# Patient Record
Sex: Male | Born: 1965
Health system: Southern US, Community
[De-identification: ages and names within clinical notes are randomized; demographics above are authoritative.]

## PROBLEM LIST (undated history)

## (undated) DIAGNOSIS — E119 Type 2 diabetes mellitus without complications: Secondary | ICD-10-CM

## (undated) DIAGNOSIS — I10 Essential (primary) hypertension: Secondary | ICD-10-CM

---
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
## 2006-03-21 DIAGNOSIS — K529 Noninfective gastroenteritis and colitis, unspecified: Secondary | ICD-10-CM | POA: Insufficient documentation

## 2010-03-21 ENCOUNTER — Emergency Department: Payer: Self-pay | Admitting: Emergency Medicine

## 2014-04-25 ENCOUNTER — Emergency Department: Payer: Self-pay | Admitting: Emergency Medicine

## 2014-04-25 LAB — COMPREHENSIVE METABOLIC PANEL
AST: 44 U/L — AB (ref 15–37)
Albumin: 3.2 g/dL — ABNORMAL LOW (ref 3.4–5.0)
Alkaline Phosphatase: 78 U/L
Anion Gap: 7 (ref 7–16)
BUN: 13 mg/dL (ref 7–18)
Bilirubin,Total: 0.3 mg/dL (ref 0.2–1.0)
CHLORIDE: 103 mmol/L (ref 98–107)
CO2: 27 mmol/L (ref 21–32)
Calcium, Total: 8.5 mg/dL (ref 8.5–10.1)
Creatinine: 1.11 mg/dL (ref 0.60–1.30)
EGFR (African American): >60
EGFR (Non-African Amer.): >60
Glucose: 151 mg/dL — ABNORMAL HIGH (ref 65–99)
OSMOLALITY: 277 (ref 275–301)
POTASSIUM: 3.7 mmol/L (ref 3.5–5.1)
SGPT (ALT): 80 U/L — ABNORMAL HIGH
Sodium: 137 mmol/L (ref 136–145)
TOTAL PROTEIN: 7.4 g/dL (ref 6.4–8.2)

## 2014-04-25 LAB — CBC
HCT: 44.3 % (ref 40.0–52.0)
HGB: 14.6 g/dL (ref 13.0–18.0)
MCH: 27.5 pg (ref 26.0–34.0)
MCHC: 32.9 g/dL (ref 32.0–36.0)
MCV: 84 fL (ref 80–100)
PLATELETS: 265 10*3/uL (ref 150–440)
RBC: 5.29 10*6/uL (ref 4.40–5.90)
RDW: 14.2 % (ref 11.5–14.5)
WBC: 8.9 10*3/uL (ref 3.8–10.6)

## 2014-04-25 LAB — TROPONIN I

## 2014-04-25 LAB — PRO B NATRIURETIC PEPTIDE: B-Type Natriuretic Peptide: 64 pg/mL (ref 0–125)

## 2015-01-27 LAB — HEPATIC FUNCTION PANEL
ALT: 45 U/L — AB (ref 10–40)
AST: 29 U/L (ref 14–40)
Alkaline Phosphatase: 93 U/L (ref 25–125)
BILIRUBIN, TOTAL: 0.4 mg/dL

## 2015-01-27 LAB — CBC AND DIFFERENTIAL
HCT: 42 % (ref 41–53)
Hemoglobin: 14.5 g/dL (ref 13.5–17.5)
Neutrophils Absolute: 2 /uL
Platelets: 268 10*3/uL (ref 150–399)
WBC: 5.3 10^3/mL

## 2015-01-27 LAB — BASIC METABOLIC PANEL
BUN: 9 mg/dL (ref 4–21)
Creatinine: 1.1 mg/dL (ref 0.6–1.3)
Glucose: 128 mg/dL
POTASSIUM: 4.3 mmol/L (ref 3.4–5.3)
SODIUM: 138 mmol/L (ref 137–147)

## 2015-02-04 DIAGNOSIS — R030 Elevated blood-pressure reading, without diagnosis of hypertension: Secondary | ICD-10-CM

## 2015-02-04 DIAGNOSIS — IMO0001 Reserved for inherently not codable concepts without codable children: Secondary | ICD-10-CM | POA: Insufficient documentation

## 2015-02-04 DIAGNOSIS — E66811 Obesity, class 1: Secondary | ICD-10-CM | POA: Insufficient documentation

## 2015-02-04 DIAGNOSIS — L03119 Cellulitis of unspecified part of limb: Secondary | ICD-10-CM | POA: Insufficient documentation

## 2015-02-04 DIAGNOSIS — R600 Localized edema: Secondary | ICD-10-CM | POA: Insufficient documentation

## 2015-02-04 DIAGNOSIS — E669 Obesity, unspecified: Secondary | ICD-10-CM | POA: Insufficient documentation

## 2015-02-04 DIAGNOSIS — L918 Other hypertrophic disorders of the skin: Secondary | ICD-10-CM | POA: Insufficient documentation

## 2015-02-06 ENCOUNTER — Encounter: Payer: Self-pay | Admitting: Family Medicine

## 2015-02-06 ENCOUNTER — Ambulatory Visit (INDEPENDENT_AMBULATORY_CARE_PROVIDER_SITE_OTHER): Payer: BLUE CROSS/BLUE SHIELD | Admitting: Family Medicine

## 2015-02-06 VITALS — BP 158/102 | HR 74 | Temp 98.3°F | Resp 16 | Wt 294.4 lb

## 2015-02-06 DIAGNOSIS — L02419 Cutaneous abscess of limb, unspecified: Secondary | ICD-10-CM

## 2015-02-06 DIAGNOSIS — I10 Essential (primary) hypertension: Secondary | ICD-10-CM

## 2015-02-06 DIAGNOSIS — L03119 Cellulitis of unspecified part of limb: Secondary | ICD-10-CM | POA: Diagnosis not present

## 2015-02-06 MED ORDER — HYDROCHLOROTHIAZIDE 25 MG PO TABS: 25.0000 mg | ORAL_TABLET | Freq: Every day | ORAL | Status: DC

## 2015-02-06 NOTE — Patient Instructions (Signed)

## 2015-02-06 NOTE — Progress Notes (Signed)
   Subjective:    Patient ID: Charles Fleeterrick D Heberlein [name], male    DOB: 11/13/1965, 49 y.o.   MRN: [id number]  HPI Chief Complaint  Patient presents with  . Cellulitis    lower extremity, follow up, last OV 10 days ago  Improvement in tenderness and redness since starting the Epsom saltwater soaks and Doxycycline. Has not had any fever or chills. Blood pressure was 142/98 at the last OV 01-27-15. Trying to limit sodium, caffeine, fats and beginning to exercise regularly. Family History  Problem Relation Age of Onset  . Breast cancer Mother   . Cancer Father   . Diabetes Father    Past Surgical History  Procedure Laterality Date  . Elbow surgery Right 1975  . Leg surgery Right 1971   Patient Active Problem List   Diagnosis Date Noted  . Achrochordon 02/04/2015  . Bacterial skin infection of leg 02/04/2015  . Edema of foot 02/04/2015  . Blood pressure elevated 02/04/2015  . Adult BMI 30+ 02/04/2015  . Enteritis presumed infectious 03/21/2006   No Known Allergies History  Substance Use Topics  . Smoking status: Never Smoker   . Smokeless tobacco: Current User    Types: Chew     Comment: has been using for 8 years  . Alcohol Use: No     Review of Systems  Constitutional: Negative.   Respiratory: Negative.   Cardiovascular: Negative.    Gastrointestinal: Negative.   Skin: Positive for color change. Negative for rash and wound.      Blood pressure 158/102, pulse 74, temperature 98.3 F (36.8 C), temperature source Oral, resp. rate 16, weight 294 lb 6.4 oz (133.539 kg). Objective:   Physical Exam  Constitutional: He appears well-developed and well-nourished.  Eyes: Pupils are equal, round, and reactive to light.  Cardiovascular: Normal rate, regular rhythm and normal heart sounds.   Pulmonary/Chest: Effort normal and breath sounds normal.  Abdominal: Soft. Bowel sounds are normal.  Lymphadenopathy:       Right: No inguinal adenopathy present.  Skin:      No current  outpatient prescriptions on file prior to visit.   No current facility-administered medications on file prior to visit.    No Known Allergies     Assessment & Plan:  Cellulitis and abscess of lower extremity Healing well. Will finish the Doxycycline today. May continue soaks prn and recheck as needed. Essential hypertension - Plan: hydrochlorothiazide (HYDRODIURIL) 25 MG tablet Persistent elevation of BP. Will treat with HCTZ and recheck in 2 weeks. Continue exercise and diet plan to lose some weight. Limit sodium and caffeine intake as previously planned.

## 2015-02-20 ENCOUNTER — Encounter: Payer: Self-pay | Admitting: Family Medicine

## 2015-02-20 ENCOUNTER — Ambulatory Visit (INDEPENDENT_AMBULATORY_CARE_PROVIDER_SITE_OTHER): Payer: BLUE CROSS/BLUE SHIELD | Admitting: Family Medicine

## 2015-02-20 VITALS — BP 160/98 | HR 76 | Temp 98.5°F | Resp 18 | Wt 289.4 lb

## 2015-02-20 DIAGNOSIS — I10 Essential (primary) hypertension: Secondary | ICD-10-CM

## 2015-02-20 DIAGNOSIS — L03115 Cellulitis of right lower limb: Secondary | ICD-10-CM

## 2015-02-20 MED ORDER — LISINOPRIL-HYDROCHLOROTHIAZIDE 10-12.5 MG PO TABS: 1.0000 | ORAL_TABLET | Freq: Every day | ORAL | Status: DC

## 2015-02-20 NOTE — Progress Notes (Signed)
Subjective:    Patient ID: Charles [name]., male    DOB: October 04, 1965, 49 y.o.   MRN: [id number]  Chief Complaint  Patient presents with  . Follow-up blood pressure elevation. Tolerating HCTZ without side effects. Actually "feeling better". It is causing him to urinate more.    -  Cellulitis of the right lower leg is nearly completely cleared. No discomfort or open sores. No swelling. Minimal pinkness remains. Has finished all the antibiotics. Using knee high support hose.  HPI Patient Active Problem List   Diagnosis Date Noted  . Achrochordon 02/04/2015  . Bacterial skin infection of leg 02/04/2015  . Edema of foot 02/04/2015  . Blood pressure elevated 02/04/2015  . Adult BMI 30+ 02/04/2015  . Enteritis presumed infectious 03/21/2006   Past Surgical History  Procedure Laterality Date  . Elbow surgery Right 1975  . Leg surgery Right 1971   Family History  Problem Relation Age of Onset  . Breast cancer Mother   . Cancer Father   . Diabetes Father    History   Social History  . Marital Status: Married    Spouse Name: N/A  . Number of Children: N/A  . Years of Education: N/A   Occupational History  . Not on file.   Social History Main Topics  . Smoking status: Never Smoker   . Smokeless tobacco: Current User    Types: Chew     Comment: has been using for 8 years  . Alcohol Use: No  . Drug Use: No  . Sexual Activity: Not on file   Other Topics Concern  . Not on file   Social History Narrative   Current Outpatient Prescriptions on File Prior to Visit  Medication Sig Dispense Refill  . aspirin 325 MG tablet Take 325 mg by mouth daily.    . hydrochlorothiazide (HYDRODIURIL) 25 MG tablet Take 1 tablet (25 mg total) by mouth daily. 30 tablet 3   No current facility-administered medications on file prior to visit.   No Known Allergies  Review of Systems  Constitutional: Negative.   HENT: Negative.   Respiratory: Negative.   Cardiovascular: Negative.    Gastrointestinal: Negative.   Skin:       Cellulitis clearing.      BP 160/98 mmHg  Pulse 76  Temp(Src) 98.5 F (36.9 C) (Oral)  Resp 18  Wt 289 lb 6.4 oz (131.271 kg)  Objective:   Physical Exam  Constitutional: He is oriented to person, place, and time. He appears well-developed and well-nourished. No distress.  HENT:  Head: Normocephalic and atraumatic.  Right Ear: Hearing normal.  Left Ear: Hearing normal.  Nose: Nose normal.  Eyes: Conjunctivae and lids are normal. Right eye exhibits no discharge. Left eye exhibits no discharge. No scleral icterus.  Pulmonary/Chest: Effort normal. No respiratory distress.  Musculoskeletal: Normal range of motion.  Neurological: He is alert and oriented to person, place, and time.  Skin: Skin is intact. No lesion and no rash noted.  Psychiatric: He has a normal mood and affect. His speech is normal and behavior is normal. Thought content normal.       Assessment & Plan:  1. Essential hypertension Feeling well on the HCTZ 25 mg qd but BP not down any yet. Has been working on diet and lost 5 lbs. Will add Lisinopril/HCTZ and stop the HCTZ 25 mg qd. - CBC with Differential/Platelet; Future - Comprehensive metabolic panel; Future - lisinopril-hydrochlorothiazide (PRINZIDE,ZESTORETIC) 10-12.5 MG per tablet; Take 1 tablet  by mouth daily.  Dispense: 30 tablet; Refill: 3  2. Cellulitis of right lower extremity Resolved. No further discomfort. Recheck labs for any remaining infection. - CBC with Differential/Platelet; Future

## 2015-02-21 LAB — COMPREHENSIVE METABOLIC PANEL
ALT: 53 IU/L — ABNORMAL HIGH (ref 0–44)
AST: 42 IU/L — ABNORMAL HIGH (ref 0–40)
Albumin/Globulin Ratio: 1.4 (ref 1.1–2.5)
Albumin: 4.2 g/dL (ref 3.5–5.5)
Alkaline Phosphatase: 96 IU/L (ref 39–117)
BILIRUBIN TOTAL: 0.4 mg/dL (ref 0.0–1.2)
BUN / CREAT RATIO: 12 (ref 9–20)
BUN: 13 mg/dL (ref 6–24)
CHLORIDE: 99 mmol/L (ref 97–108)
CO2: 27 mmol/L (ref 18–29)
Calcium: 9.5 mg/dL (ref 8.7–10.2)
Creatinine, Ser: 1.1 mg/dL (ref 0.76–1.27)
GFR, EST AFRICAN AMERICAN: 89 mL/min/{1.73_m2} (ref >59)
GFR, EST NON AFRICAN AMERICAN: 77 mL/min/{1.73_m2} (ref >59)
Globulin, Total: 3.1 g/dL (ref 1.5–4.5)
Glucose: 117 mg/dL — ABNORMAL HIGH (ref 65–99)
POTASSIUM: 4 mmol/L (ref 3.5–5.2)
Sodium: 140 mmol/L (ref 134–144)
Total Protein: 7.3 g/dL (ref 6.0–8.5)

## 2015-02-21 LAB — CBC WITH DIFFERENTIAL/PLATELET
Basophils Absolute: 0 10*3/uL (ref 0.0–0.2)
Basos: 1 %
EOS (ABSOLUTE): 0.2 10*3/uL (ref 0.0–0.4)
Eos: 3 %
HEMATOCRIT: 45.1 % (ref 37.5–51.0)
Hemoglobin: 15.1 g/dL (ref 12.6–17.7)
Immature Grans (Abs): 0 10*3/uL (ref 0.0–0.1)
Immature Granulocytes: 0 %
Lymphocytes Absolute: 3.8 10*3/uL — ABNORMAL HIGH (ref 0.7–3.1)
Lymphs: 57 %
MCH: 27.3 pg (ref 26.6–33.0)
MCHC: 33.5 g/dL (ref 31.5–35.7)
MCV: 81 fL (ref 79–97)
MONOCYTES: 6 %
MONOS ABS: 0.4 10*3/uL (ref 0.1–0.9)
NEUTROS ABS: 2.2 10*3/uL (ref 1.4–7.0)
Neutrophils: 33 %
Platelets: 279 10*3/uL (ref 150–379)
RBC: 5.54 x10E6/uL (ref 4.14–5.80)
RDW: 15.2 % (ref 12.3–15.4)
WBC: 6.7 10*3/uL (ref 3.4–10.8)

## 2015-02-23 ENCOUNTER — Telehealth: Payer: Self-pay

## 2015-02-23 NOTE — Telephone Encounter (Signed)
Patient advised as directed below. Patient verbalized understanding.  

## 2015-02-23 NOTE — Telephone Encounter (Signed)
-----   Message from Tamsen Roers, [place] sent at 02/22/2015 10:46 PM EDT ----- No sign of any remaining infection on CBC. Liver enzymes show a little strain. Recommend increased water intake and recheck BP and liver enzymes in 2 months.

## 2015-03-25 ENCOUNTER — Other Ambulatory Visit: Payer: Self-pay | Admitting: Student

## 2015-03-25 DIAGNOSIS — G5611 Other lesions of median nerve, right upper limb: Secondary | ICD-10-CM

## 2015-03-25 DIAGNOSIS — M7521 Bicipital tendinitis, right shoulder: Secondary | ICD-10-CM

## 2015-03-26 ENCOUNTER — Ambulatory Visit: Admission: RE | Admit: 2015-03-26 | Payer: BLUE CROSS/BLUE SHIELD | Source: Ambulatory Visit

## 2015-03-27 ENCOUNTER — Ambulatory Visit: Admit: 2015-03-27 | Payer: Self-pay

## 2015-03-27 ENCOUNTER — Ambulatory Visit
Admission: RE | Admit: 2015-03-27 | Discharge: 2015-03-27 | Disposition: A | Discharge status: Home / Self Care | Payer: BLUE CROSS/BLUE SHIELD | Source: Ambulatory Visit | Attending: Student | Admitting: Student

## 2015-03-27 DIAGNOSIS — S46211A Strain of muscle, fascia and tendon of other parts of biceps, right arm, initial encounter: Secondary | ICD-10-CM | POA: Diagnosis not present

## 2015-03-27 DIAGNOSIS — M79631 Pain in right forearm: Secondary | ICD-10-CM | POA: Diagnosis present

## 2015-03-27 DIAGNOSIS — M7521 Bicipital tendinitis, right shoulder: Secondary | ICD-10-CM

## 2015-03-27 DIAGNOSIS — G5611 Other lesions of median nerve, right upper limb: Secondary | ICD-10-CM

## 2015-03-30 DIAGNOSIS — S46219A Strain of muscle, fascia and tendon of other parts of biceps, unspecified arm, initial encounter: Secondary | ICD-10-CM | POA: Insufficient documentation

## 2015-03-31 ENCOUNTER — Encounter
Admission: RE | Disposition: A | Discharge status: Home / Self Care | Payer: Self-pay | Source: Ambulatory Visit | Attending: Surgery

## 2015-03-31 ENCOUNTER — Ambulatory Visit: Payer: BLUE CROSS/BLUE SHIELD | Admitting: Anesthesiology

## 2015-03-31 ENCOUNTER — Encounter: Payer: Self-pay | Admitting: Anesthesiology

## 2015-03-31 ENCOUNTER — Ambulatory Visit
Admission: RE | Admit: 2015-03-31 | Discharge: 2015-03-31 | Disposition: A | Discharge status: Home / Self Care | Payer: BLUE CROSS/BLUE SHIELD | Source: Ambulatory Visit | Attending: Surgery | Admitting: Surgery

## 2015-03-31 DIAGNOSIS — Y998 Other external cause status: Secondary | ICD-10-CM | POA: Diagnosis not present

## 2015-03-31 DIAGNOSIS — F17220 Nicotine dependence, chewing tobacco, uncomplicated: Secondary | ICD-10-CM | POA: Insufficient documentation

## 2015-03-31 DIAGNOSIS — R9431 Abnormal electrocardiogram [ECG] [EKG]: Secondary | ICD-10-CM | POA: Diagnosis not present

## 2015-03-31 DIAGNOSIS — S46211A Strain of muscle, fascia and tendon of other parts of biceps, right arm, initial encounter: Secondary | ICD-10-CM | POA: Insufficient documentation

## 2015-03-31 DIAGNOSIS — Y9369 Activity, other involving other sports and athletics played as a team or group: Secondary | ICD-10-CM | POA: Insufficient documentation

## 2015-03-31 DIAGNOSIS — Z7982 Long term (current) use of aspirin: Secondary | ICD-10-CM | POA: Insufficient documentation

## 2015-03-31 DIAGNOSIS — I10 Essential (primary) hypertension: Secondary | ICD-10-CM | POA: Diagnosis not present

## 2015-03-31 DIAGNOSIS — X58XXXA Exposure to other specified factors, initial encounter: Secondary | ICD-10-CM | POA: Insufficient documentation

## 2015-03-31 DIAGNOSIS — Z79899 Other long term (current) drug therapy: Secondary | ICD-10-CM | POA: Insufficient documentation

## 2015-03-31 DIAGNOSIS — E669 Obesity, unspecified: Secondary | ICD-10-CM | POA: Diagnosis not present

## 2015-03-31 HISTORY — PX: DISTAL BICEPS TENDON REPAIR: SHX1461

## 2015-03-31 SURGERY — REPAIR, TENDON, BICEPS, DISTAL
Anesthesia: General | Site: Elbow | Laterality: Right | Wound class: Clean

## 2015-03-31 MED ORDER — NEOMYCIN-POLYMYXIN B GU 40-200000 IR SOLN
Status: AC
  Filled 2015-03-31: qty 2

## 2015-03-31 MED ORDER — LABETALOL HCL 5 MG/ML IV SOLN
INTRAVENOUS | Status: DC | PRN
  Administered 2015-03-31: 10 mg via INTRAVENOUS

## 2015-03-31 MED ORDER — BUPIVACAINE HCL (PF) 0.5 % IJ SOLN
INTRAMUSCULAR | Status: AC
  Filled 2015-03-31: qty 30

## 2015-03-31 MED ORDER — OXYCODONE HCL 5 MG PO TABS
5.0000 mg | ORAL_TABLET | Freq: Once | ORAL | Status: AC
  Administered 2015-03-31: 5 mg via ORAL

## 2015-03-31 MED ORDER — FENTANYL CITRATE (PF) 100 MCG/2ML IJ SOLN
INTRAMUSCULAR | Status: AC
  Administered 2015-03-31: 50 ug via INTRAVENOUS
  Filled 2015-03-31: qty 2

## 2015-03-31 MED ORDER — GLYCOPYRROLATE 0.2 MG/ML IJ SOLN
INTRAMUSCULAR | Status: DC | PRN
  Administered 2015-03-31: 0.2 mg via INTRAVENOUS

## 2015-03-31 MED ORDER — MIDAZOLAM HCL 5 MG/ML IJ SOLN
1.0000 mg | Freq: Once | INTRAMUSCULAR | Status: AC
  Administered 2015-03-31: 1 mg via INTRAVENOUS

## 2015-03-31 MED ORDER — BUPIVACAINE HCL (PF) 0.5 % IJ SOLN
INTRAMUSCULAR | Status: DC | PRN
  Administered 2015-03-31: 15 mL

## 2015-03-31 MED ORDER — MIDAZOLAM HCL 2 MG/2ML IJ SOLN
INTRAMUSCULAR | Status: DC | PRN
  Administered 2015-03-31 (×2): 1 mg via INTRAVENOUS

## 2015-03-31 MED ORDER — OXYCODONE HCL 5 MG PO TABS: 5.0000 mg | ORAL_TABLET | ORAL | Status: DC | PRN

## 2015-03-31 MED ORDER — MIDAZOLAM HCL 5 MG/5ML IJ SOLN
INTRAMUSCULAR | Status: AC
  Filled 2015-03-31: qty 5

## 2015-03-31 MED ORDER — LACTATED RINGERS IV SOLN
INTRAVENOUS | Status: DC | PRN
  Administered 2015-03-31: 13:00:00 via INTRAVENOUS

## 2015-03-31 MED ORDER — OXYCODONE HCL 5 MG PO TABS
ORAL_TABLET | ORAL | Status: AC
  Filled 2015-03-31: qty 1

## 2015-03-31 MED ORDER — ONDANSETRON HCL 4 MG/2ML IJ SOLN: 4.0000 mg | Freq: Once | INTRAMUSCULAR | Status: DC | PRN

## 2015-03-31 MED ORDER — FENTANYL CITRATE (PF) 100 MCG/2ML IJ SOLN
50.0000 ug | Freq: Once | INTRAMUSCULAR | Status: AC
  Administered 2015-03-31: 50 ug via INTRAVENOUS

## 2015-03-31 MED ORDER — NEOMYCIN-POLYMYXIN B GU IR SOLN
Status: DC | PRN
  Administered 2015-03-31: 400 mL

## 2015-03-31 MED ORDER — PROPOFOL 10 MG/ML IV BOLUS
INTRAVENOUS | Status: DC | PRN
  Administered 2015-03-31: 150 mg via INTRAVENOUS

## 2015-03-31 MED ORDER — DEXTROSE 5 % IV SOLN
3.0000 g | Freq: Once | INTRAVENOUS | Status: AC
  Administered 2015-03-31: 3 g via INTRAVENOUS
  Filled 2015-03-31: qty 3000

## 2015-03-31 MED ORDER — LACTATED RINGERS IV SOLN
Freq: Once | INTRAVENOUS | Status: AC
  Administered 2015-03-31: 09:00:00 via INTRAVENOUS

## 2015-03-31 MED ORDER — LIDOCAINE HCL (CARDIAC) 20 MG/ML IV SOLN
INTRAVENOUS | Status: DC | PRN
  Administered 2015-03-31: 40 mg via INTRAVENOUS

## 2015-03-31 MED ORDER — FENTANYL CITRATE (PF) 100 MCG/2ML IJ SOLN: 25.0000 ug | INTRAMUSCULAR | Status: DC | PRN

## 2015-03-31 MED ORDER — FENTANYL CITRATE (PF) 100 MCG/2ML IJ SOLN
INTRAMUSCULAR | Status: DC | PRN
  Administered 2015-03-31 (×2): 50 ug via INTRAVENOUS

## 2015-03-31 SURGICAL SUPPLY — 39 items
BANDAGE ELASTIC 4 CLIP ST LF (GAUZE/BANDAGES/DRESSINGS) ×3 IMPLANT
BENZOIN TINCTURE PRP APPL 2/3 (GAUZE/BANDAGES/DRESSINGS) ×3 IMPLANT
BNDG COHESIVE 4X5 TAN STRL (GAUZE/BANDAGES/DRESSINGS) ×3 IMPLANT
BNDG COHESIVE 4X5 WHT NS (GAUZE/BANDAGES/DRESSINGS) ×3 IMPLANT
BNDG ESMARK 4X12 TAN STRL LF (GAUZE/BANDAGES/DRESSINGS) ×3 IMPLANT
CANISTER SUCT 1200ML W/VALVE (MISCELLANEOUS) ×3 IMPLANT
CHLORAPREP W/TINT 26ML (MISCELLANEOUS) ×6 IMPLANT
CLOSURE WOUND 1/2 X4 (GAUZE/BANDAGES/DRESSINGS) ×1
CUFF TOURN 24 STER (MISCELLANEOUS) ×3 IMPLANT
CUFF TOURN SGL QUICK 18 (TOURNIQUET CUFF) IMPLANT
DRAPE FLUOR MINI C-ARM 54X84 (DRAPES) ×3 IMPLANT
DRAPE SURG 17X11 SM STRL (DRAPES) ×3 IMPLANT
GAUZE PETRO XEROFOAM 1X8 (MISCELLANEOUS) ×3 IMPLANT
GAUZE SPONGE 4X4 12PLY STRL (GAUZE/BANDAGES/DRESSINGS) ×3 IMPLANT
GAUZE XEROFORM 4X4 STRL (GAUZE/BANDAGES/DRESSINGS) ×3 IMPLANT
GLOVE BIO SURGEON STRL SZ8 (GLOVE) ×6 IMPLANT
GLOVE INDICATOR 8.0 STRL GRN (GLOVE) ×3 IMPLANT
GOWN STRL REUS W/ TWL LRG LVL3 (GOWN DISPOSABLE) ×2 IMPLANT
GOWN STRL REUS W/TWL LRG LVL3 (GOWN DISPOSABLE) ×4
IMPL TOGGLELOC ELBOW SYSTEM (Orthopedic Implant) ×1 IMPLANT
IMPLANT TOGGLELOC ELBOW SYSTEM (Orthopedic Implant) ×3 IMPLANT
KIT RM TURNOVER STRD PROC AR (KITS) ×3 IMPLANT
NS IRRIG 500ML POUR BTL (IV SOLUTION) ×3 IMPLANT
PACK EXTREMITY ARMC (MISCELLANEOUS) ×3 IMPLANT
PAD CAST CTTN 4X4 STRL (SOFTGOODS) ×1 IMPLANT
PAD GROUND ADULT SPLIT (MISCELLANEOUS) ×3 IMPLANT
PADDING CAST 4IN STRL (MISCELLANEOUS) ×4
PADDING CAST BLEND 4X4 STRL (MISCELLANEOUS) ×2 IMPLANT
PADDING CAST COTTON 4X4 STRL (SOFTGOODS) ×2
SLING ARM XL TX990206 (SOFTGOODS) ×3 IMPLANT
SPLINT FAST PLASTER 5X30 (CAST SUPPLIES) ×2
SPLINT PLASTER CAST FAST 5X30 (CAST SUPPLIES) ×1 IMPLANT
STOCKINETTE IMPERVIOUS LG (DRAPES) ×3 IMPLANT
STRIP CLOSURE SKIN 1/2X4 (GAUZE/BANDAGES/DRESSINGS) ×2 IMPLANT
SUT VIC AB 2-0 SH 27 (SUTURE) ×2
SUT VIC AB 2-0 SH 27XBRD (SUTURE) ×1 IMPLANT
SUT VIC AB 3-0 SH 27 (SUTURE) ×4
SUT VIC AB 3-0 SH 27X BRD (SUTURE) ×2 IMPLANT
SUT VICRYL AB 3-0 FS1 BRD 27IN (SUTURE) ×6 IMPLANT

## 2015-03-31 NOTE — OR Nursing (Signed)
Wife at bedside. Awaiting Dr. Juliann Pares. Pt continues to deny chest pain or SOB.

## 2015-03-31 NOTE — OR Nursing (Signed)
Dr. Callwood at bedside to assess pt. 

## 2015-03-31 NOTE — Anesthesia Preprocedure Evaluation (Addendum)
Anesthesia Evaluation  Patient identified by MRN, date of birth, ID band Patient awake    Reviewed: Allergy & Precautions, NPO status , Patient's Chart, lab work & pertinent test results, reviewed documented beta blocker date and time   Airway Mallampati: III  TM Distance: >3 FB     Dental  (+) Chipped, Poor Dentition   Pulmonary          Cardiovascular     Neuro/Psych    GI/Hepatic   Endo/Other    Renal/GU      Musculoskeletal   Abdominal   Peds  Hematology   Anesthesia Other Findings Obesity.  Reproductive/Obstetrics                             Anesthesia Physical Anesthesia Plan  ASA: III  Anesthesia Plan: General   Post-op Pain Management: GA combined w/ Regional for post-op pain   Induction: Intravenous  Airway Management Planned: Oral ETT  Additional Equipment:   Intra-op Plan:   Post-operative Plan:   Informed Consent: I have reviewed the patients History and Physical, chart, labs and discussed the procedure including the risks, benefits and alternatives for the proposed anesthesia with the patient or authorized representative who has indicated his/her understanding and acceptance.     Plan Discussed with: CRNA  Anesthesia Plan Comments:         Anesthesia Quick Evaluation

## 2015-03-31 NOTE — OR Nursing (Signed)
Per Dr. Maisie Fus, request cardiology consult.

## 2015-03-31 NOTE — Consult Note (Signed)
Reason for Consult: the preop clearance ,abnormal EKG Referring Physician:  Joanie Coddington, Dr Joice Lofts  anesthesiologist  Orlene Och [name] is an 49 y.o. [name].  HPI:  Charles [name] preop for biceps tendon surgery he has history of hypertension cellulitis of the right  Foot. As part of preoperative assessment patient had an EKG which was grossly abnormal with diffuse ST segment changes and LVH findings. Patient denies chest pain shortness of breath syncope palpitation blackout  Spells, no previous cardiac history.  Before proceeding with the procedure.  Anesthesiology needed evaluation and EKG and patient by Cardiology.  History reviewed. No pertinent past medical history.  Past Surgical History  Procedure Laterality Date  . Elbow surgery Right 1975  . Leg surgery Right 1971    Family History  Problem Relation Age of Onset  . Breast cancer Mother   . Cancer Father   . Diabetes Father     Social History:  reports that he has never smoked. His smokeless tobacco use includes Chew. He reports that he does not drink alcohol or use illicit drugs.  Allergies: No Known Allergies  Medications: I have reviewed the patient's current medications.  No results found for this or any previous visit (from the past 48 hour(s)).  No results found.  Review of Systems  Constitutional: Negative.   HENT: Negative.   Eyes: Negative.   Respiratory: Negative.   Cardiovascular: Negative.   Gastrointestinal: Negative.   Musculoskeletal: Negative.   Skin: Negative.   Neurological: Negative.   Psychiatric/Behavioral: Negative.    Blood pressure 143/66, pulse 70, temperature 98.1 F (36.7 C), temperature source Oral, resp. rate 16, SpO2 100 %. Physical Exam  Constitutional: He is oriented to person, place, and time. He appears well-developed and well-nourished.  HENT:  Head: Normocephalic.  Eyes: Pupils are equal, round, and reactive to light.  Neck: Normal range of motion. Neck supple.   Cardiovascular: Normal rate and normal heart sounds.   Respiratory: Effort normal and breath sounds normal.  GI: Soft. Bowel sounds are normal.  Musculoskeletal: Normal range of motion.  Neurological: He is alert and oriented to person, place, and time.  Skin: Skin is warm and dry.    Assessment/Plan:  abnormal EKG   preop for orthopedic surgery  hypertension  cellulitis . PLAN  patient appears to be an acceptable surgical risk for orthopedic surgery  would recommend cardiac evaluation as an outpatient after the procedure  suggests echocardiogram and functional study  recommend continued blood pressure control  aspirin therapy for  arteriosclerotic vascular disease primary prevention  aerobic exercise as necessary  follow-up with Cardiology as an outpatient 1-2 weeks  Renner Sebald D. 03/31/2015, 2:13 PM

## 2015-03-31 NOTE — OR Nursing (Signed)
Pt continues to rest in bed without distress. Family at bedside.

## 2015-03-31 NOTE — Anesthesia Procedure Notes (Addendum)
Procedure Name: LMA Insertion Date/Time: 03/31/2015 1:38 PM Performed by: Charna Busman Pre-anesthesia Checklist: Patient identified, Emergency Drugs available, Suction available and Patient being monitored Patient Re-evaluated:Patient Re-evaluated prior to inductionOxygen Delivery Method: Circle system utilized Preoxygenation: Pre-oxygenation with 100% oxygen Intubation Type: Combination inhalational/ intravenous induction Ventilation: Mask ventilation without difficulty LMA: LMA inserted LMA Size: 5.0 Grade View: Grade II Tube type: Oral Tube secured with: Tape Dental Injury: Teeth and Oropharynx as per pre-operative assessment    Anesthesia Regional Block:  Supraclavicular block  Pre-Anesthetic Checklist: ,, timeout performed, Correct Patient, Correct Site, Correct Laterality, Correct Procedure, Correct Position, site marked, Risks and benefits discussed, pre-op evaluation,  At surgeon's request and post-op pain management   Prep: Betadine       Needles:  Injection technique: Single-shot  Needle Type: Echogenic Stimulator Needle     Needle Length: 5cm 5 cm Needle Gauge: 21 and 21 G    Additional Needles:  Procedures: ultrasound guided (picture in chart) and nerve stimulator Supraclavicular block  Nerve Stimulator or Paresthesia:  Response: biceps flexion, 0.8 mA,   Additional Responses:   Narrative:  Injection made incrementally with aspirations every 5 mL.  Performed by: Personally  Anesthesiologist: Berdine Addison  Additional Notes: Functioning IV was confirmed and monitors were applied.  A 50mm 22ga Arrow echogenic stimulator needle was used. Sterile prep and drape,hand hygiene and sterile gloves were used.  Negative aspiration and negative test dose prior to incremental administration of local anesthetic. The patient tolerated the procedure well.  Ultrasound guidance: relevent anatomy identified, needle position confirmed, local anesthetic spread visualized  around nerve(s), vascular puncture avoided.  20 ml of 0.25% marcaine used with 0.1 mg epi.

## 2015-03-31 NOTE — Op Note (Signed)
03/31/2015  3:10 PM  Patient:   Charles [name].  Pre-Op Diagnosis:   Distal biceps tendon rupture, right elbow.  Post-Op Diagnosis:   Same.  Procedure:   Primary repair of ruptured distal biceps tendon, right elbow.  Surgeon:   Maryagnes Amos, MD  Assistant:   None  Anesthesia:   General LMA with interscalene block  Findings:   As above.  Complications:   None  EBL:   2 cc  Fluids:   900 cc crystalloid  TT:   64 minutes at 250 mmHg  Drains:   None  Closure:   3-0 Vicryl subcuticular sutures  Implants:   Biomet ToggleLoc 1  Brief Clinical Note:   The patient is a 49 year old male who sustained the above-noted injury 2 weeks ago when he stopped his arm swelling suddenly while bowling with a 16 lb. ball as a 66-year-old ran across the lane in front of him. A subsequent MRI scan confirmed the presence of a distal biceps tendon rupture. He presents at this time for definitive management of the injury.  Procedure:   The patient underwent placement of an interscalene block in the preoperative holding area before he was brought into the operating room and lain in the supine position. He underwent general laryngal mask anesthesia before a timeout was performed to verify the appropriate surgical site. The right upper extremity and hand were prepped with DuraPrep solution before being draped sterilely. Preoperative antibiotics were administered. The limb was exsanguinated with an Esmarch and the tourniquet inflated to 250 mmHg. A 4-5 cm curvilinear incision was made over the antecubital fossa. The incision was carried down through the subcutaneous cutaneous tissues with care taken to identify and protect the neurovascular structures in the area. Using blunt dissection, the distal biceps tendon was identified. The lacertus fibrosis was still intact, preventing proximal migration of the tendon, so it was readily identified. The distal portion of the tendon was debrided of degenerative  tissues before a #2 OrthoSorb suture was placed through the tendon in a Krakw type suture technique, incorporating the loop of the Biomet ToggleLoc device. Tension was applied to the suture to be sure that it would not slip later.  Attention was directed distally. The bicipital sheath was followed down to the bicipital groove using blunt dissection. With care, the bicipital tuberosity was identified by palpation and then dissected free of adjacent soft tissue. A guidewire was placed and its position verified fluoroscopically in AP and lateral projections. The guidewire was overreamed with an 8 mm acorn reamer through the anterior cortex before the posterior cortex was overreamed with a 4.5 mm reamer. The passing sutures of the ToggleLoc anchor were passed through the eye of the Beath needle and the needle brought out posteriorly, pulling the anchor through the radius. It was "set" before the tendon was cinched into the socket with the elbow flexed to 90. The adequacy of anchor position was verified fluoroscopically in AP and lateral projections and found to be satisfactory. The anterior sutures of the ToggleLoc device were tied securely before being cut short, while the suture pulling the anchor through the radius posteriorly also was removed. The repair was deemed to be stable to within 20 of extension.  The wound was copiously irrigated with sterile saline solution before the subcutaneous tissues were closed with 2-0 Vicryl interrupted sutures. 3-0 Vicryl subcuticular sutures were used to close the skin. A sterile bulky dressing was applied to the elbow before the arm was placed into  a posterior splint maintaining the elbow at approximately 90 of flexion and in neutral rotation. The patient was then awakened and returned to the recovery room in satisfactory condition after tolerating the procedure well.

## 2015-03-31 NOTE — Discharge Instructions (Addendum)
Keep splint dry and intact. Keep hand elevated above heart level. Use sling as necessary for comfort. Apply ice to affected area frequently. Return for follow-up in 10-14 days or as scheduled.AMBULATORY SURGERY  DISCHARGE INSTRUCTIONS   1) The drugs that you were given will stay in your system until tomorrow so for the next 24 hours you should not:  A) Drive an automobile B) Make any legal decisions C) Drink any alcoholic beverage   2) You may resume regular meals tomorrow.  Today it is better to start with liquids and gradually work up to solid foods.  You may eat anything you prefer, but it is better to start with liquids, then soup and crackers, and gradually work up to solid foods.   3) Please notify your doctor immediately if you have any unusual bleeding, trouble breathing, redness and pain at the surgery site, drainage, fever, or pain not relieved by medication.    4) Additional Instructions:        Please contact your physician with any problems or Same Day Surgery at [phone], Monday through Friday 6 am to 4 pm, or [place] at [facility] number at [phone].

## 2015-03-31 NOTE — OR Nursing (Signed)
Dr. Juliann Pares paged and to come to PACU and assess pt.

## 2015-03-31 NOTE — H&P (Signed)
Paper H&P to be scanned into permanent record. H&P reviewed. No changes. The preoperative EKG demonstrated flipped T-waves in several of the precordial leads. The cardiologist was consulted and gave his clearance to proceed with the surgery, but recommended that the patient follow up with him in the near future for a more formal work-up.

## 2015-03-31 NOTE — OR Nursing (Signed)
Per Dr. Juliann Pares, ok for pt to have surgery today. Dr. Joice Lofts at bedside and aware. Pt to f/u with Dr. Juliann Pares in 1 week. Appt set up with Dr. Glennis Brink office and information given to pt and wife.

## 2015-03-31 NOTE — Transfer of Care (Signed)
Immediate Anesthesia Transfer of Care Note  Patient: Charles Och [name]  Procedure(s) Performed: Procedure(s): DISTAL BICEPS TENDON REPAIR (Right)  Patient Location: PACU  Anesthesia Type:General  Level of Consciousness: awake and patient cooperative  Airway & Oxygen Therapy: Patient Spontanous Breathing and Patient connected to nasal cannula oxygen  Post-op Assessment: Report given to RN and Post -op Vital signs reviewed and stable  Post vital signs: Reviewed and stable  Last Vitals:  Filed Vitals:   03/31/15 1521  BP:   Pulse: 66  Temp: 36.2 C  Resp:     Complications: No apparent anesthesia complications

## 2015-03-31 NOTE — OR Nursing (Signed)
Pt to PACU to have nerve block pre operatively. ST depression noted on cardiac monitor prior to nerve block. Dr. Maisie Fus notified. Nerve block delayed. EKG ordered. Pt denies chest pain, SOB or other symptoms.

## 2015-03-31 NOTE — OR Nursing (Signed)
Pt resting in bed without distress. Awaiting surgery.

## 2015-03-31 NOTE — Anesthesia Postprocedure Evaluation (Signed)
  Anesthesia Post-op Note  Patient: Charles Och [name]  Procedure(s) Performed: Procedure(s): DISTAL BICEPS TENDON REPAIR (Right)  Anesthesia type:General  Patient location: PACU  Post pain: Pain level controlled  Post assessment: Post-op Vital signs reviewed, Patient's Cardiovascular Status Stable, Respiratory Function Stable, Patent Airway and No signs of Nausea or vomiting  Post vital signs: Reviewed and stable  Last Vitals:  Filed Vitals:   03/31/15 1752  BP: 163/98  Pulse: 63  Temp:   Resp: 18    Level of consciousness: awake, alert  and patient cooperative  Complications: No apparent anesthesia complications

## 2015-03-31 NOTE — OR Nursing (Signed)
RT at bedside to perform EKG.

## 2015-04-01 ENCOUNTER — Encounter: Payer: Self-pay | Admitting: Surgery

## 2015-04-01 MED FILL — Neomycin-Polymyxin B GU Irrigation Soln: Qty: 1 | Status: AC

## 2015-04-03 ENCOUNTER — Encounter: Payer: Self-pay | Admitting: Family Medicine

## 2015-04-03 ENCOUNTER — Ambulatory Visit (INDEPENDENT_AMBULATORY_CARE_PROVIDER_SITE_OTHER): Payer: BLUE CROSS/BLUE SHIELD | Admitting: Family Medicine

## 2015-04-03 VITALS — BP 142/100 | HR 85 | Temp 97.9°F | Resp 18 | Wt 289.4 lb

## 2015-04-03 DIAGNOSIS — I10 Essential (primary) hypertension: Secondary | ICD-10-CM | POA: Diagnosis not present

## 2015-04-03 NOTE — Progress Notes (Signed)
Patient ID: Charles Fleet., male   DOB: 08/19/1966, 49 y.o.   MRN: [id number]   Chief Complaint  Patient presents with  . Follow-up    6 week follow up     Subjective:  HPI  This 49 year old presents for follow up of hypertension. Still taking Lisinopril-HCTZ 10/12.5 mg qd without side effects. Had rupture of the right bicep tendon while bowling (tried to stop the ball when a child walked in front of him) and surgical repair 03-31-15. Had cardiology evaluation prior to surgery and will have follow up for rhythm changes in 3 days for a stress test. Having pain in right arm and taking narcotic pain medication today.  Past Surgical History  Procedure Laterality Date  . Elbow surgery Right 1975  . Leg surgery Right 1971  . Distal biceps tendon repair Right 03/31/2015    Procedure: DISTAL BICEPS TENDON REPAIR;  Surgeon: Christena Flake, MD;  Location: [facility] ORS;  Service: Orthopedics;  Laterality: Right;     Prior to Admission medications   Medication Sig Start Date End Date Taking? Authorizing Provider  aspirin 325 MG tablet Take 325 mg by mouth daily.   Yes Historical Provider, MD  lisinopril-hydrochlorothiazide (PRINZIDE,ZESTORETIC) 10-12.5 MG per tablet Take 1 tablet by mouth daily. 02/20/15  Yes Dennis E Chrismon, PA  oxyCODONE (ROXICODONE) 5 MG immediate release tablet Take 1-2 tablets (5-10 mg total) by mouth every 4 (four) hours as needed for severe pain. 03/31/15  Yes Christena Flake, MD    Patient Active Problem List   Diagnosis Date Noted  . Biceps tendon rupture 03/30/2015  . Achrochordon 02/04/2015  . Bacterial skin infection of leg 02/04/2015  . Edema of foot 02/04/2015  . Blood pressure elevated 02/04/2015  . Adult BMI 30+ 02/04/2015  . Enteritis presumed infectious 03/21/2006    History reviewed. No pertinent past medical history.  History   Social History  . Marital Status: Married    Spouse Name: N/A  . Number of Children: N/A  . Years of Education: N/A    Occupational History  . Not on file.   Social History Main Topics  . Smoking status: Never Smoker   . Smokeless tobacco: Current User    Types: Chew     Comment: has been using for 8 years  . Alcohol Use: No  . Drug Use: No  . Sexual Activity: Not on file   Other Topics Concern  . Not on file   Social History Narrative    No Known Allergies  Review of Systems  Constitutional: Negative.   HENT: Negative.   Eyes: Negative.   Respiratory: Negative.   Cardiovascular: Positive for palpitations.       Rare flutter sensation  Gastrointestinal: Negative.   Genitourinary: Negative.     There is no immunization history on file for this patient. Objective:  BP 142/100 mmHg  Pulse 85  Temp(Src) 97.9 F (36.6 C) (Oral)  Resp 18  Wt 289 lb 6.4 oz (131.271 kg)  SpO2 95%  Physical Exam  Constitutional: He is well-developed, well-nourished, and in no distress.  HENT:  Head: Normocephalic and atraumatic.  Eyes: Conjunctivae and EOM are normal.  Cardiovascular: Normal rate, regular rhythm and normal heart sounds.   Pulmonary/Chest: Effort normal and breath sounds normal.  Musculoskeletal:  Large bandage and splint on right arm from recent ruptured bicep tendon repair - 03-31-15.    Lab Results  Component Value Date   WBC 6.7 02/20/2015  HGB 14.5 01/27/2015   HCT 45.1 02/20/2015   PLT 268 01/27/2015   GLUCOSE 117* 02/20/2015    CMP     Component Value Date/Time   NA 140 02/20/2015 1108   NA 137 04/25/2014 1919   K 4.0 02/20/2015 1108   K 3.7 04/25/2014 1919   CL 99 02/20/2015 1108   CL 103 04/25/2014 1919   CO2 27 02/20/2015 1108   CO2 27 04/25/2014 1919   GLUCOSE 117* 02/20/2015 1108   GLUCOSE 151* 04/25/2014 1919   BUN 13 02/20/2015 1108   BUN 13 04/25/2014 1919   CREATININE 1.10 02/20/2015 1108   CREATININE 1.1 01/27/2015   CREATININE 1.11 04/25/2014 1919   CALCIUM 9.5 02/20/2015 1108   CALCIUM 8.5 04/25/2014 1919   PROT 7.3 02/20/2015 1108   PROT  7.4 04/25/2014 1919   ALBUMIN 3.2* 04/25/2014 1919   AST 42* 02/20/2015 1108   AST 44* 04/25/2014 1919   ALT 53* 02/20/2015 1108   ALT 80* 04/25/2014 1919   ALKPHOS 96 02/20/2015 1108   ALKPHOS 78 04/25/2014 1919   BILITOT 0.4 02/20/2015 1108   BILITOT 0.3 04/25/2014 1919   GFRNONAA 77 02/20/2015 1108   GFRNONAA >60 04/25/2014 1919   GFRAA 89 02/20/2015 1108   GFRAA >60 04/25/2014 1919    Assessment and Plan :  1. Essential hypertension Elevation of BP today with recent trauma and surgery to the right arm. (Repeat BP check was 150/98 today after a 5 minute rest.) Will continue Lisinopril-HCTZ 10/12.5 mg qd and follow up with cardiologist (Dr. Juliann Pares) 04-06-15 and follow up with orthopedic surgeon on 04-14-15. States stress test planned for 04-16-15. Recheck BP in 1 month.   Julieanne Manson MD [facility] 04/03/2015 8:12 AM

## 2015-05-04 ENCOUNTER — Encounter: Payer: Self-pay | Admitting: Family Medicine

## 2015-05-04 ENCOUNTER — Ambulatory Visit (INDEPENDENT_AMBULATORY_CARE_PROVIDER_SITE_OTHER): Payer: BLUE CROSS/BLUE SHIELD | Admitting: Family Medicine

## 2015-05-04 VITALS — BP 140/86 | HR 82 | Temp 97.8°F | Resp 18 | Wt 290.6 lb

## 2015-05-04 DIAGNOSIS — I10 Essential (primary) hypertension: Secondary | ICD-10-CM | POA: Diagnosis not present

## 2015-05-04 DIAGNOSIS — S46211D Strain of muscle, fascia and tendon of other parts of biceps, right arm, subsequent encounter: Secondary | ICD-10-CM

## 2015-05-04 DIAGNOSIS — S46111D Strain of muscle, fascia and tendon of long head of biceps, right arm, subsequent encounter: Secondary | ICD-10-CM | POA: Diagnosis not present

## 2015-05-04 NOTE — Progress Notes (Signed)
Patient ID: Charles Fleet., male   DOB: 1965-12-18, 49 y.o.   MRN: [id number]     Subjective:  HPI 1 month follow up hypertension. Tolerating Lisinopril-HCTZ 10-12.5 mg qd and trying to control fats in diet. Cardiologist (Dr. Juliann Pares) decided to postpone stress test until right arm injury has improved. States the echocardiogram to evaluate heart murmur has not been read by cardiologist yet. Orthopedist has increased ROM in brace on right elbow but noticing some swelling in the right hand because he has to wear the brace tight to keep it from sliding down. Has follow up with him on 05-12-15. Feeling the arm is stronger and much improved.  Prior to Admission medications   Medication Sig Start Date End Date Taking? Authorizing Provider  aspirin 325 MG tablet Take 325 mg by mouth daily.   Yes Historical Provider, MD  lisinopril-hydrochlorothiazide (PRINZIDE,ZESTORETIC) 10-12.5 MG per tablet Take 1 tablet by mouth daily. 02/20/15  Yes Tamsen Roers, PA    Patient Active Problem List   Diagnosis Date Noted  . Hypertension 04/03/2015  . Essential (primary) hypertension 04/03/2015  . Biceps tendon rupture 03/30/2015  . Achrochordon 02/04/2015  . Bacterial skin infection of leg 02/04/2015  . Edema of foot 02/04/2015  . Blood pressure elevated 02/04/2015  . Adult BMI 30+ 02/04/2015  . Enteritis presumed infectious 03/21/2006    History reviewed. No pertinent past medical history.  Social History   Social History  . Marital Status: Married    Spouse Name: N/A  . Number of Children: N/A  . Years of Education: N/A   Occupational History  . Not on file.   Social History Main Topics  . Smoking status: Never Smoker   . Smokeless tobacco: Current User    Types: Chew     Comment: has been using for 8 years  . Alcohol Use: No  . Drug Use: No  . Sexual Activity: Not on file   Other Topics Concern  . Not on file   Social History Narrative   Past Surgical History  Procedure  Laterality Date  . Elbow surgery Right 1975  . Leg surgery Right 1971  . Distal biceps tendon repair Right 03/31/2015    Procedure: DISTAL BICEPS TENDON REPAIR;  Surgeon: Christena Flake, MD;  Location: [facility] ORS;  Service: Orthopedics;  Laterality: Right;   No Known Allergies  Review of Systems  Constitutional: Negative.   HENT: Negative.   Eyes: Negative.   Respiratory: Negative.   Cardiovascular: Negative.   Musculoskeletal:       Increasing use of right arm but still in brace full time.  Neurological: Positive for tingling.       Right hand with constant use of post op brace and strapping.    There is no immunization history on file for this patient. Objective:  BP 140/86 mmHg  Pulse 82  Temp(Src) 97.8 F (36.6 C) (Oral)  Resp 18  Wt 290 lb 9.6 oz (131.815 kg)  SpO2 98% Wt Readings from Last 3 Encounters:  05/04/15 290 lb 9.6 oz (131.815 kg)  04/03/15 289 lb 6.4 oz (131.271 kg)  03/27/15 285 lb (129.275 kg)   BP Readings from Last 3 Encounters:  05/04/15 140/86  04/03/15 142/100  03/31/15 163/98   Physical Exam  Constitutional: He is well-developed, well-nourished, and in no distress.  HENT:  Head: Normocephalic and atraumatic.  Eyes: Conjunctivae and EOM are normal.  Neck: Normal range of motion. Neck supple.  Cardiovascular: Normal rate and  regular rhythm.   Pulmonary/Chest: Effort normal and breath sounds normal.  Musculoskeletal:  History of ruptured right bicep with surgical repair. In ROM limiting brace with some swelling in the right hand. Scar in right antecubital fossa healing well.  Psychiatric: Affect and judgment normal.    Lab Results  Component Value Date   WBC 6.7 02/20/2015   HGB 14.5 01/27/2015   HCT 45.1 02/20/2015   PLT 268 01/27/2015   GLUCOSE 117* 02/20/2015    CMP     Component Value Date/Time   NA 140 02/20/2015 1108   NA 137 04/25/2014 1919   K 4.0 02/20/2015 1108   K 3.7 04/25/2014 1919   CL 99 02/20/2015 1108   CL 103  04/25/2014 1919   CO2 27 02/20/2015 1108   CO2 27 04/25/2014 1919   GLUCOSE 117* 02/20/2015 1108   GLUCOSE 151* 04/25/2014 1919   BUN 13 02/20/2015 1108   BUN 13 04/25/2014 1919   CREATININE 1.10 02/20/2015 1108   CREATININE 1.1 01/27/2015   CREATININE 1.11 04/25/2014 1919   CALCIUM 9.5 02/20/2015 1108   CALCIUM 8.5 04/25/2014 1919   PROT 7.3 02/20/2015 1108   PROT 7.4 04/25/2014 1919   ALBUMIN 3.2* 04/25/2014 1919   AST 42* 02/20/2015 1108   AST 44* 04/25/2014 1919   ALT 53* 02/20/2015 1108   ALT 80* 04/25/2014 1919   ALKPHOS 96 02/20/2015 1108   ALKPHOS 78 04/25/2014 1919   BILITOT 0.4 02/20/2015 1108   BILITOT 0.3 04/25/2014 1919   GFRNONAA 77 02/20/2015 1108   GFRNONAA >60 04/25/2014 1919   GFRAA 89 02/20/2015 1108   GFRAA >60 04/25/2014 1919    Assessment and Plan :  1. Essential (primary) hypertension Better control and tolerating Lisinopril-HCTZ 10/12.5 mg qd without side effects. Continue follow up with cardiologist as planned to recheck murmur and palpitation issues. Denies any significant complaint recently. Recheck BP and labs in 3 months.  2. Biceps tendon rupture, right, subsequent encounter Healing well and getting better ROM now. Continue ROM limiting brace and follow up with orthopedist as planned.   Julieanne Manson MD [facility] 05/04/2015 9:48 AM

## 2015-06-06 ENCOUNTER — Encounter: Payer: Self-pay | Admitting: Emergency Medicine

## 2015-06-06 ENCOUNTER — Emergency Department: Payer: BLUE CROSS/BLUE SHIELD

## 2015-06-06 ENCOUNTER — Emergency Department
Admission: EM | Admit: 2015-06-06 | Discharge: 2015-06-06 | Disposition: A | Discharge status: Home / Self Care | Payer: BLUE CROSS/BLUE SHIELD | Attending: Emergency Medicine | Admitting: Emergency Medicine

## 2015-06-06 DIAGNOSIS — L03116 Cellulitis of left lower limb: Secondary | ICD-10-CM | POA: Insufficient documentation

## 2015-06-06 DIAGNOSIS — Z79899 Other long term (current) drug therapy: Secondary | ICD-10-CM | POA: Insufficient documentation

## 2015-06-06 DIAGNOSIS — Z7982 Long term (current) use of aspirin: Secondary | ICD-10-CM | POA: Insufficient documentation

## 2015-06-06 DIAGNOSIS — I10 Essential (primary) hypertension: Secondary | ICD-10-CM | POA: Insufficient documentation

## 2015-06-06 HISTORY — DX: Essential (primary) hypertension: I10

## 2015-06-06 LAB — CBC WITH DIFFERENTIAL/PLATELET
Basophils Absolute: 0.1 10*3/uL (ref 0–0.1)
Basophils Relative: 1 %
EOS ABS: 0.1 10*3/uL (ref 0–0.7)
EOS PCT: 1 %
HCT: 45.8 % (ref 40.0–52.0)
Hemoglobin: 15.2 g/dL (ref 13.0–18.0)
Lymphocytes Relative: 26 %
Lymphs Abs: 2.7 10*3/uL (ref 1.0–3.6)
MCH: 27.3 pg (ref 26.0–34.0)
MCHC: 33.2 g/dL (ref 32.0–36.0)
MCV: 82.1 fL (ref 80.0–100.0)
MONOS PCT: 14 %
Monocytes Absolute: 1.5 10*3/uL — ABNORMAL HIGH (ref 0.2–1.0)
Neutro Abs: 6.2 10*3/uL (ref 1.4–6.5)
Neutrophils Relative %: 58 %
PLATELETS: 220 10*3/uL (ref 150–440)
RBC: 5.57 MIL/uL (ref 4.40–5.90)
RDW: 14.3 % (ref 11.5–14.5)
WBC: 10.5 10*3/uL (ref 3.8–10.6)

## 2015-06-06 LAB — TROPONIN I: Troponin I: <0.03 ng/mL (ref <0.031)

## 2015-06-06 LAB — COMPREHENSIVE METABOLIC PANEL
ALT: 30 U/L (ref 17–63)
ANION GAP: 7 (ref 5–15)
AST: 21 U/L (ref 15–41)
Albumin: 3.6 g/dL (ref 3.5–5.0)
Alkaline Phosphatase: 78 U/L (ref 38–126)
BILIRUBIN TOTAL: 0.8 mg/dL (ref 0.3–1.2)
BUN: 11 mg/dL (ref 6–20)
CHLORIDE: 104 mmol/L (ref 101–111)
CO2: 25 mmol/L (ref 22–32)
Calcium: 8.7 mg/dL — ABNORMAL LOW (ref 8.9–10.3)
Creatinine, Ser: 1.09 mg/dL (ref 0.61–1.24)
GFR calc Af Amer: >60 mL/min (ref >60)
GFR calc non Af Amer: >60 mL/min (ref >60)
GLUCOSE: 101 mg/dL — AB (ref 65–99)
POTASSIUM: 3.4 mmol/L — AB (ref 3.5–5.1)
Sodium: 136 mmol/L (ref 135–145)
TOTAL PROTEIN: 7.5 g/dL (ref 6.5–8.1)

## 2015-06-06 MED ORDER — VANCOMYCIN HCL IN DEXTROSE 1-5 GM/200ML-% IV SOLN
1000.0000 mg | Freq: Once | INTRAVENOUS | Status: AC
  Administered 2015-06-06: 1000 mg via INTRAVENOUS
  Filled 2015-06-06: qty 200

## 2015-06-06 MED ORDER — CLINDAMYCIN HCL 300 MG PO CAPS: 300.0000 mg | ORAL_CAPSULE | Freq: Three times a day (TID) | ORAL | Status: DC

## 2015-06-06 NOTE — ED Notes (Signed)
MD at bedside for eval.

## 2015-06-06 NOTE — ED Provider Notes (Signed)
[name] Emergency Department Provider Note  Time seen: 8:29 PM  I have reviewed the triage vital signs and the nursing notes.   HISTORY  Chief Complaint Leg Swelling    HPI Charles GERVASI [name] is a 49 y.o. male with a past medical history of hypertension presents the emergency department left leg redness and swelling. According to the patient for the past several days he has noticed itching and now with redness and swelling of the left lower extremity. He states a history of cellulitis in the right lower extremity approximately 7 or 8 months ago. Denies any history of blood clots in the past. Denies nausea or vomiting. Does states subjective fever tonight at home.Describes the pain in the leg as mild, he is more concerned about the redness and swelling.     Past Medical History  Diagnosis Date  . Hypertension     Patient Active Problem List   Diagnosis Date Noted  . Hypertension 04/03/2015  . Essential (primary) hypertension 04/03/2015  . Biceps tendon rupture 03/30/2015  . Achrochordon 02/04/2015  . Bacterial skin infection of leg 02/04/2015  . Edema of foot 02/04/2015  . Blood pressure elevated 02/04/2015  . Adult BMI 30+ 02/04/2015  . Enteritis presumed infectious 03/21/2006    Past Surgical History  Procedure Laterality Date  . Elbow surgery Right 1975  . Leg surgery Right 1971  . Distal biceps tendon repair Right 03/31/2015    Procedure: DISTAL BICEPS TENDON REPAIR;  Surgeon: Christena Flake, MD;  Location: [facility] ORS;  Service: Orthopedics;  Laterality: Right;    Current Outpatient Rx  Name  Route  Sig  Dispense  Refill  . aspirin 325 MG tablet   Oral   Take 325 mg by mouth daily.         Marland Kitchen lisinopril-hydrochlorothiazide (PRINZIDE,ZESTORETIC) 10-12.5 MG per tablet   Oral   Take 1 tablet by mouth daily.   30 tablet   3     Allergies Review of patient's allergies indicates no known allergies.  Family History  Problem Relation  Age of Onset  . Breast cancer Mother   . Cancer Father   . Diabetes Father     Social History Social History  Substance Use Topics  . Smoking status: Never Smoker   . Smokeless tobacco: Current User    Types: Chew     Comment: has been using for 8 years  . Alcohol Use: No    Review of Systems Constitutional: Subjective fever. Cardiovascular: Negative for chest pain. Respiratory: Negative for shortness of breath. Gastrointestinal: Negative for abdominal pain Musculoskeletal: Left lower extremity swelling and redness. Skin: Left leg swelling and redness Neurological: Negative for headache 10-point ROS otherwise negative.  ____________________________________________   PHYSICAL EXAM:  VITAL SIGNS: ED Triage Vitals  Enc Vitals Group     BP 06/06/15 1713 148/102 mmHg     Pulse Rate 06/06/15 1713 80     Resp 06/06/15 1713 20     Temp 06/06/15 1713 98.2 F (36.8 C)     Temp Source 06/06/15 1713 Oral     SpO2 06/06/15 1713 97 %     Weight 06/06/15 1713 290 lb (131.543 kg)     Height 06/06/15 1713  (1.93 m)     Head Cir --      Peak Flow --      Pain Score 06/06/15 1715 3     Pain Loc --      Pain Edu? --  Excl. in GC? --     Constitutional: Alert and oriented. Well appearing and in no distress. ENT   Head: Normocephalic and atraumatic.   Mouth/Throat: Mucous membranes are moist. Cardiovascular: Normal rate, regular rhythm. No murmur Respiratory: Normal respiratory effort without tachypnea nor retractions. Breath sounds are clear Gastrointestinal: Soft and nontender. No distention.  Musculoskeletal: Left lower extremity erythema and mild swelling extending up to the mid tibia. Warm. Minimal tenderness palpation. No popliteal tenderness to palpation. Neurologic:  Normal speech and language. No gross focal neurologic deficits  Psychiatric: Mood and affect are normal. Speech and behavior are normal ____________________________________________      RADIOLOGY  Ultrasound negative for DVT   INITIAL IMPRESSION / ASSESSMENT AND PLAN / ED COURSE  Pertinent labs & imaging results that were available during my care of the patient were reviewed by me and considered in my medical decision making (see chart for details).  Ultrasound negative for DVT, vitals largely within normal limits, labs are largely within normal limits. Exam most consistent with left lower extremity cellulitis. We will dose IV vancomycin in the emergency department and discharged home with antibiotics and primary care follow-up. The patient is agreeable to plan. Discussed strict return precautions for any fever, increased redness (we have outlined the cellulitis), or vomiting, patient is agreeable to plan.  ____________________________________________   FINAL CLINICAL IMPRESSION(S) / ED DIAGNOSES  Left lower extremity cellulitis   [name], MD 06/06/15 2033

## 2015-06-06 NOTE — ED Notes (Signed)
States history of cellulitis R leg, denies known injury

## 2015-06-06 NOTE — ED Notes (Signed)
Boxed lunch and ice water provided to patient.

## 2015-06-06 NOTE — Discharge Instructions (Signed)

## 2015-06-22 ENCOUNTER — Other Ambulatory Visit: Payer: Self-pay | Admitting: Family Medicine

## 2015-06-22 NOTE — Telephone Encounter (Signed)
Call [facility] to change Lisinopril-HCTZ 10-12.5 mg qd to a 90 day supply for 6 months.

## 2015-06-22 NOTE — Telephone Encounter (Signed)
Pt contacted office for refill request on the following medications:   lisinopril-hydrochlorothiazide (PRINZIDE,ZESTORETIC) 10-12.5 MG.   90 day supply.  [facility] [address]  [phone]#[phone]/MW

## 2015-06-23 MED ORDER — LISINOPRIL-HYDROCHLOROTHIAZIDE 10-12.5 MG PO TABS: 1.0000 | ORAL_TABLET | Freq: Every day | ORAL | Status: DC

## 2015-06-23 NOTE — Addendum Note (Signed)
Addended by: Sherre PootWALSTON, Saraann Enneking N on: 06/23/2015 09:43 AM   Modules accepted: Orders

## 2015-06-23 NOTE — Telephone Encounter (Signed)
[facility]

## 2015-08-06 ENCOUNTER — Ambulatory Visit: Payer: BLUE CROSS/BLUE SHIELD | Admitting: Family Medicine

## 2015-08-07 ENCOUNTER — Ambulatory Visit: Payer: BLUE CROSS/BLUE SHIELD | Admitting: Family Medicine

## 2015-08-10 ENCOUNTER — Ambulatory Visit (INDEPENDENT_AMBULATORY_CARE_PROVIDER_SITE_OTHER): Payer: Self-pay | Admitting: Family Medicine

## 2015-08-10 ENCOUNTER — Encounter: Payer: Self-pay | Admitting: Family Medicine

## 2015-08-10 VITALS — BP 152/90 | HR 83 | Temp 97.8°F | Resp 14 | Wt 288.0 lb

## 2015-08-10 DIAGNOSIS — I10 Essential (primary) hypertension: Secondary | ICD-10-CM
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
## 2016-07-29 ENCOUNTER — Encounter: Payer: Self-pay | Admitting: Family Medicine

## 2016-07-29 ENCOUNTER — Ambulatory Visit (INDEPENDENT_AMBULATORY_CARE_PROVIDER_SITE_OTHER): Payer: Self-pay | Admitting: Family Medicine

## 2016-07-29 VITALS — BP 142/86 | HR 92 | Temp 98.5°F | Resp 16 | Ht 76.0 in | Wt 283.0 lb

## 2016-07-29 DIAGNOSIS — L918 Other hypertrophic disorders of the skin: Secondary | ICD-10-CM

## 2016-07-29 DIAGNOSIS — I10 Essential (primary) hypertension: Secondary | ICD-10-CM

## 2016-07-29 DIAGNOSIS — Z0000 Encounter for general adult medical examination without abnormal findings: Secondary | ICD-10-CM

## 2016-07-29 DIAGNOSIS — Z1211 Encounter for screening for malignant neoplasm of colon: Secondary | ICD-10-CM | POA: Insufficient documentation

## 2016-07-29 DIAGNOSIS — Z1159 Encounter for screening for other viral diseases: Secondary | ICD-10-CM

## 2016-07-29 DIAGNOSIS — Z114 Encounter for screening for human immunodeficiency virus [HIV]: Secondary | ICD-10-CM

## 2016-07-29 DIAGNOSIS — Z23 Encounter for immunization: Secondary | ICD-10-CM

## 2016-07-29 DIAGNOSIS — Z125 Encounter for screening for malignant neoplasm of prostate: Secondary | ICD-10-CM | POA: Insufficient documentation

## 2016-07-29 NOTE — Progress Notes (Signed)
Patient: Charles FleetDerrick D Lentz [name], Male    DOB: 08/17/1966, 50 y.o.   MRN: [id number] Visit Date: 07/29/2016  Today's Provider: Dortha Kernennis Honest Vanleer, PA   Chief Complaint  Patient presents with  . Annual Exam   Subjective:    Annual physical exam Charles FleetDerrick D Bendix [name] is a 50 y.o. male who presents today for health maintenance and complete physical. He feels fairly well. He reports exercising daily at work, walking. He reports he is sleeping well.  ----------------------------------------------------------------- Colonoscopy- Never  Needs Tdap and Flu vaccines.   Review of Systems  Constitutional: Negative.   HENT: Positive for congestion and sinus pressure.   Eyes: Positive for itching.  Respiratory: Negative.   Cardiovascular: Negative.   Gastrointestinal: Negative.   Endocrine: Negative.   Genitourinary: Negative.   Musculoskeletal: Negative.   Skin: Negative.   Allergic/Immunologic: Negative.   Neurological: Negative.   Hematological: Negative.   Psychiatric/Behavioral: Negative.     Social History      He  reports that he has never smoked. His smokeless tobacco use includes Chew. He reports that he does not drink alcohol or use drugs.       Social History   Social History  . Marital status: Married    Spouse name: N/A  . Number of children: N/A  . Years of education: N/A   Social History Main Topics  . Smoking status: Never Smoker  . Smokeless tobacco: Current User    Types: Chew     Comment: has been using for 8 years  . Alcohol use No  . Drug use: No  . Sexual activity: Not Asked   Other Topics Concern  . None   Social History Narrative  . None    Past Medical History:  Diagnosis Date  . Hypertension      Patient Active Problem List   Diagnosis Date Noted  . Hypertension 04/03/2015  . Essential (primary) hypertension 04/03/2015  . Biceps tendon rupture 03/30/2015  . Achrochordon 02/04/2015  . Bacterial skin infection of leg 02/04/2015    . Edema of foot 02/04/2015  . Blood pressure elevated 02/04/2015  . Adult BMI 30+ 02/04/2015  . Enteritis presumed infectious 03/21/2006    Past Surgical History:  Procedure Laterality Date  . DISTAL BICEPS TENDON REPAIR Right 03/31/2015   Procedure: DISTAL BICEPS TENDON REPAIR;  Surgeon: Christena FlakeJohn J Poggi, MD;  Location: [facility] ORS;  Service: Orthopedics;  Laterality: Right;  . ELBOW SURGERY Right 1975  . LEG SURGERY Right 1971    Family History        Family Status  Relation Status  . Mother Alive  . Father Deceased  . Maternal Grandmother Deceased  . Maternal Grandfather Deceased  . Paternal Grandmother Deceased  . Paternal Grandfather Deceased        His family history includes Breast cancer in his mother; Cancer in his father; Diabetes in his father.     No Known Allergies   Current Outpatient Prescriptions:  .  aspirin 325 MG tablet, Take 325 mg by mouth daily., Disp: , Rfl:  .  lisinopril-hydrochlorothiazide (PRINZIDE,ZESTORETIC) 10-12.5 MG tablet, Take 1 tablet by mouth daily., Disp: 90 tablet, Rfl: 2   Patient Care Team: Tamsen Roersennis E Daphna Lafuente, PA as PCP - General (Physician Assistant)      Objective:   Vitals: BP (!) 142/86 (BP Location: Right Arm, Patient Position: Sitting, Cuff Size: Large)   Pulse 92   Temp 98.5 F (36.9 C) (Oral)  Resp 16   Ht 6\' 4"  (1.93 m)   Wt 283 lb (128.4 kg)   BMI 34.45 kg/m   Wt Readings from Last 3 Encounters:  07/29/16 283 lb (128.4 kg)  08/10/15 288 lb (130.6 kg)  06/06/15 290 lb (131.5 kg)    Physical Exam  Constitutional: He is oriented to person, place, and time. He appears well-developed and well-nourished.  HENT:  Head: Normocephalic and atraumatic.  Right Ear: External ear normal.  Left Ear: External ear normal.  Nose: Nose normal.  Mouth/Throat: Oropharynx is clear and moist.  Eyes: Conjunctivae and EOM are normal. Pupils are equal, round, and reactive to light. Right eye exhibits no discharge.  Neck: Normal range  of motion. Neck supple. No tracheal deviation present. No thyromegaly present.  Cardiovascular: Normal rate, regular rhythm, normal heart sounds and intact distal pulses.   No murmur heard. Pulmonary/Chest: Effort normal and breath sounds normal. No respiratory distress. He has no wheezes. He has no rales. He exhibits no tenderness.  Abdominal: Soft. He exhibits no distension and no mass. There is no tenderness. There is no rebound and no guarding.  Musculoskeletal: Normal range of motion. He exhibits no edema or tenderness.  Lymphadenopathy:    He has no cervical adenopathy.  Neurological: He is alert and oriented to person, place, and time. He has normal reflexes. No cranial nerve deficit. He exhibits normal muscle tone. Coordination normal.  Skin: Skin is warm and dry. No rash noted. No erythema.  Psychiatric: He has a normal mood and affect. His behavior is normal. Judgment and thought content normal.   Depression Screen PHQ 2/9 Scores 07/29/2016  PHQ - 2 Score 0    Assessment & Plan:     Routine Health Maintenance and Physical Exam  Exercise Activities and Dietary recommendations Goals    None       There is no immunization history on file for this patient.  Health Maintenance  Topic Date Due  . Hepatitis C Screening  Sep 15, 1965  . HIV Screening  12/08/1978  . TETANUS/TDAP  12/08/1982  . COLONOSCOPY  12/07/2013  . INFLUENZA VACCINE  04/05/2016     Discussed health benefits of physical activity, and encouraged him to engage in regular exercise appropriate for his age and condition.    -------------------------------------------------------------------- 1. Annual physical exam General health good. Only mild congestion symptoms occasionally. No signs of infection today. Ready to proceed with colonoscopy screening. Will get routine labs. - CBC with Differential/Platelet - Comprehensive metabolic panel - Lipid panel  2. Essential (primary) hypertension Slight  elevation today but states he has not take medication today. Tolerating the Prinzide 10-12.5 qd without side effects. Will recheck labs and follow up pending repors. - CBC with Differential/Platelet - Comprehensive metabolic panel - Lipid panel - TSH  3. Achrochordon Has a 2 mm tag raised 3mm from the left upper eyelid border among the eyelashes. States it frequently irritates eye and seen in visual field. Recommend he see his eye doctor for possible removal.  4. Screening PSA (prostate specific antigen) Asymptomatic. No prostate nodules or nocturia. Check PSA. - PSA  5. Colon cancer screening - Ambulatory referral to Gastroenterology  6. Need for immunization against influenza - Flu Vaccine QUAD 36+ mos IM  7. Need for diphtheria-tetanus-pertussis (Tdap) vaccine - Tdap vaccine greater than or equal to 7yo IM  8. Screening for HIV (human immunodeficiency virus) - HIV antibody  9. Need for hepatitis C screening test - Hepatitis C antibody  Vernie Murders, PA  [facility]

## 2016-08-11 ENCOUNTER — Other Ambulatory Visit: Payer: Self-pay | Admitting: Family Medicine

## 2016-08-22 ENCOUNTER — Telehealth: Payer: Self-pay

## 2016-08-22 NOTE — Telephone Encounter (Signed)
LVM for patient to call office and schedule colonoscopy.

## 2016-08-23 IMAGING — MR MR FOREARM*R* W/O CM
5 of 6 series · 30 of 40 positions shown · non-contrast
Comparison: None.

CLINICAL DATA: Proximal right forearm pain secondary to an injury
while bowling on 03/17/2015.

EXAM:
MRI OF THE RIGHT FOREARM WITHOUT CONTRAST
TECHNIQUE: Multiplanar, multisequence MR imaging was performed. No intravenous
contrast was administered.

[Series 9: T1 · axial · 5.0mm · 1.17mm/px · z∈[-15,+165]mm · 5 of 25 slices shown (1 of 3)]
[im 1/25]
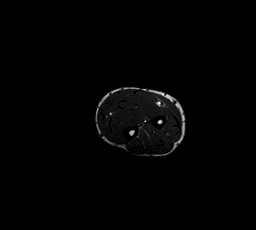
[im 7/25]
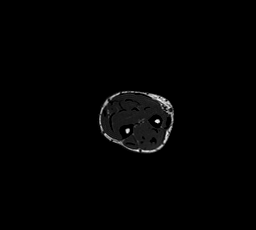
[im 13/25]
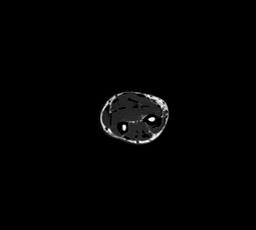
[im 19/25]
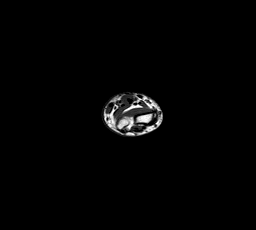
[im 25/25]
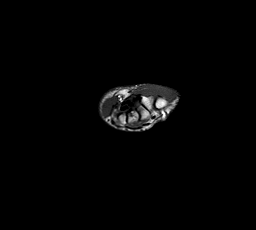

[Series 10: T1 · axial · 5.0mm · 1.17mm/px · z∈[-209,-14]mm · 6 of 27 slices shown (2 of 3)]
[im 1/27]
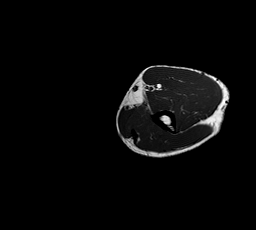
[im 6/27]
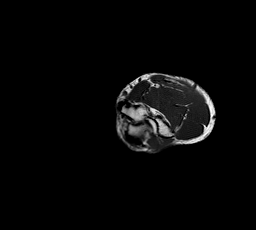
[im 11/27]
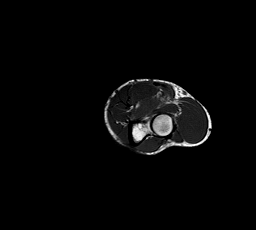
[im 16/27]
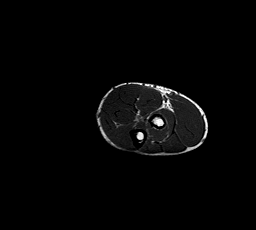
[im 21/27]
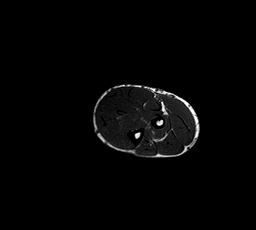
[im 27/27]
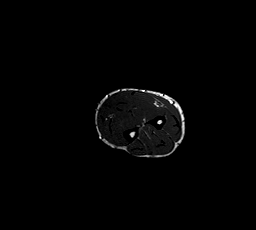

[Series 11: T2 fat-sat · axial · 5.0mm · 0.59mm/px · z∈[-214,+154]mm · 9 of 50 slices shown (1 of 2)]
[im 1/50]
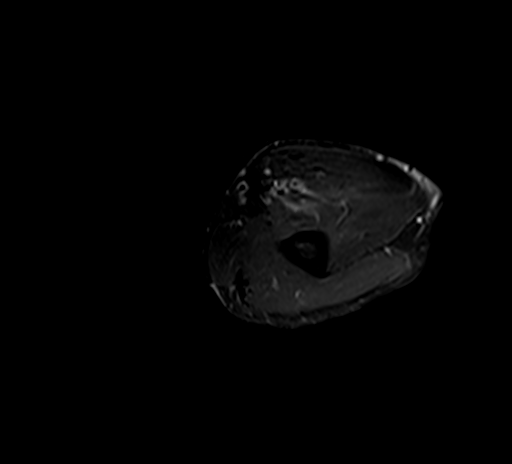
[im 9/50]
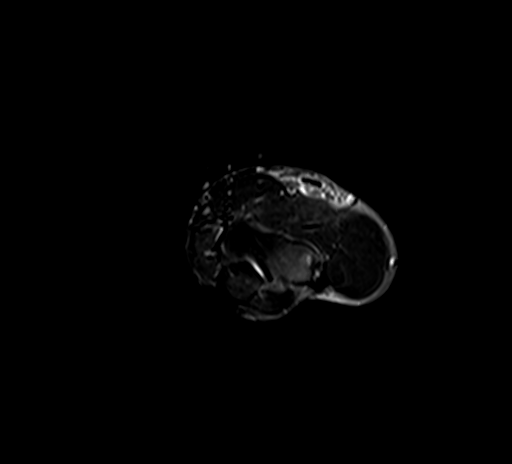
[im 14/50]
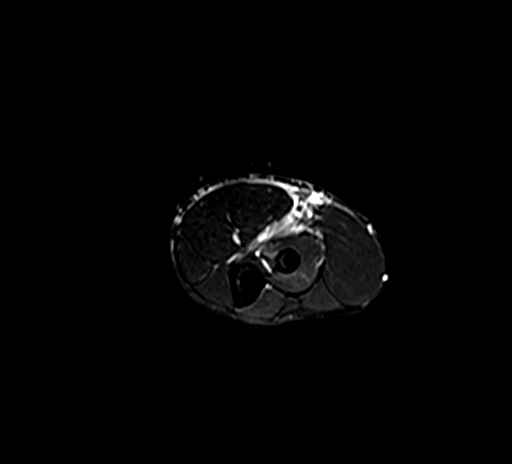
[im 23/50]
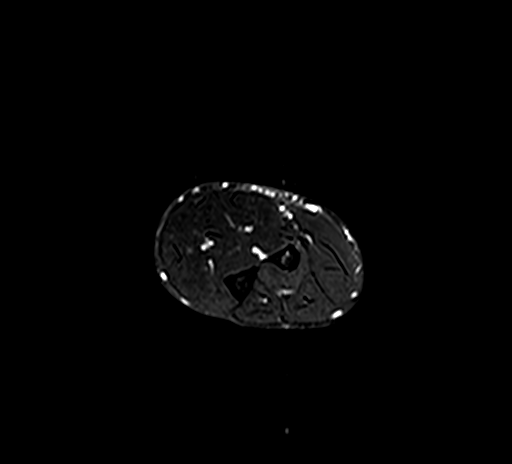
[im 27/50]
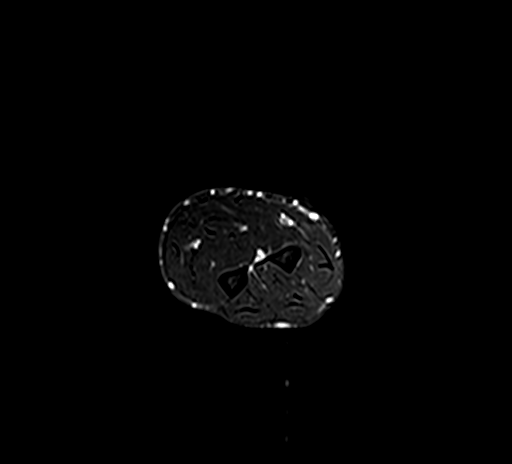
[im 36/50]
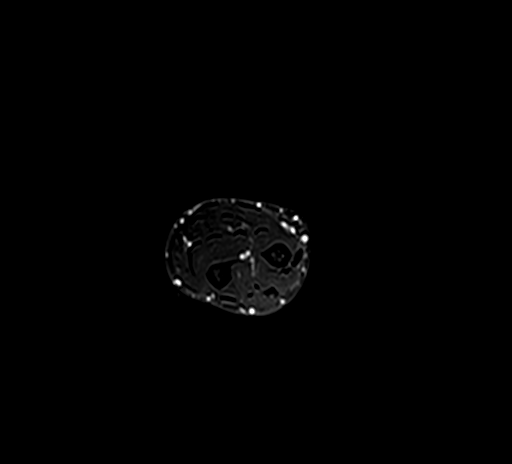
[im 41/50]
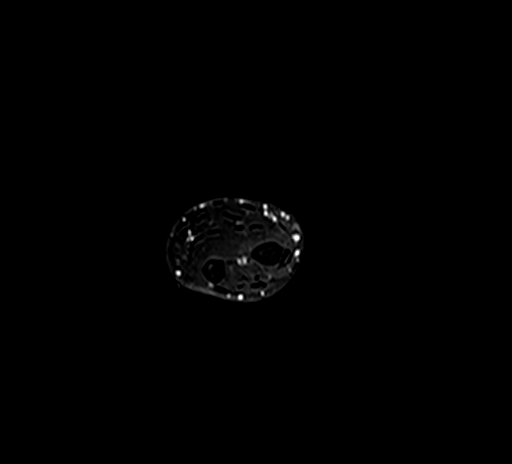
[im 45/50]
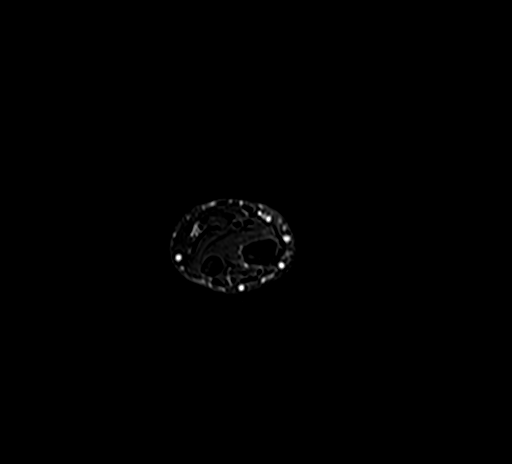
[im 50/50]
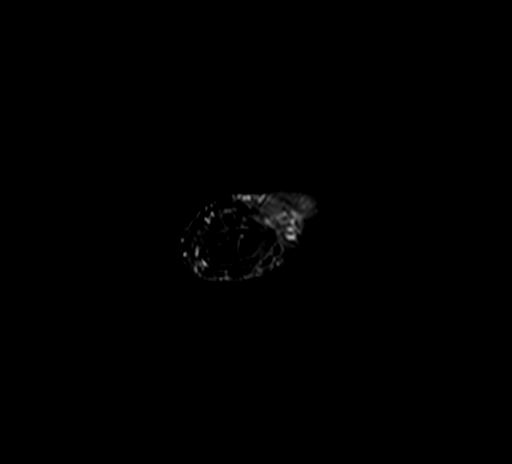

[Series 12: T1 · coronal · 4.0mm · 1.04mm/px · 4 of 23 slices shown (3 of 3)]
[im 1/23]
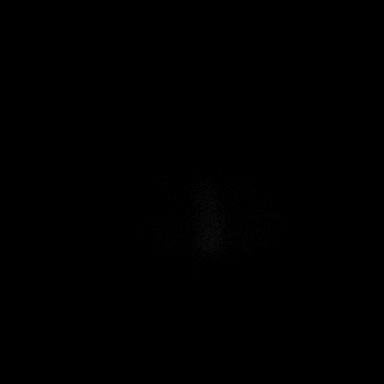
[im 5/23]
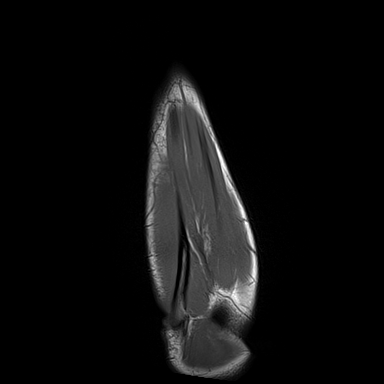
[im 9/23]
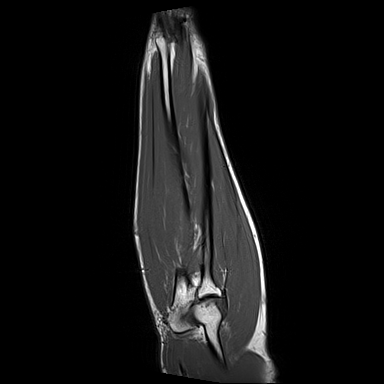
[im 14/23]
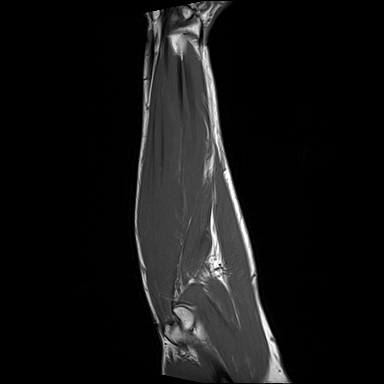

[Series 13: T2 fat-sat · coronal · 4.0mm · 0.78mm/px · 6 of 23 slices shown (2 of 2)]
[im 1/23]
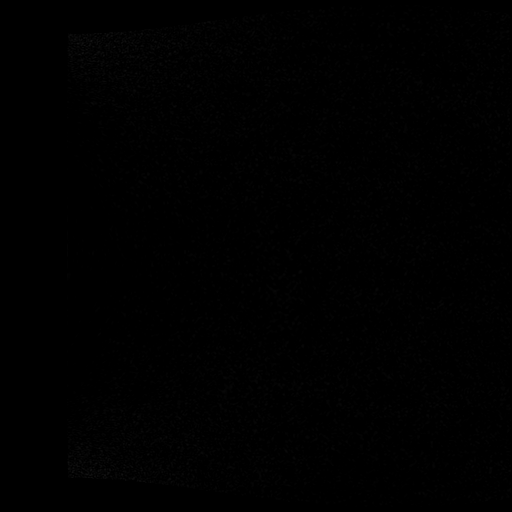
[im 5/23]
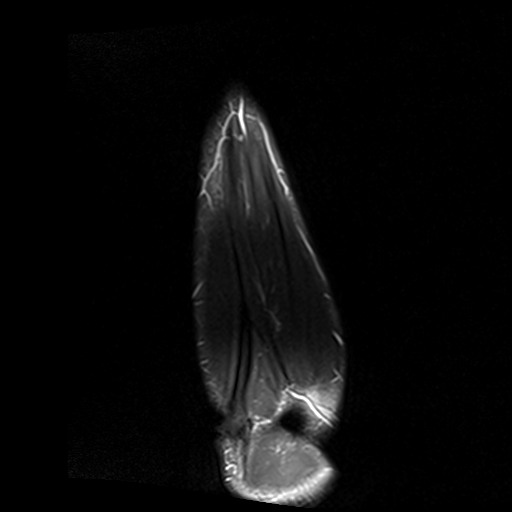
[im 9/23]
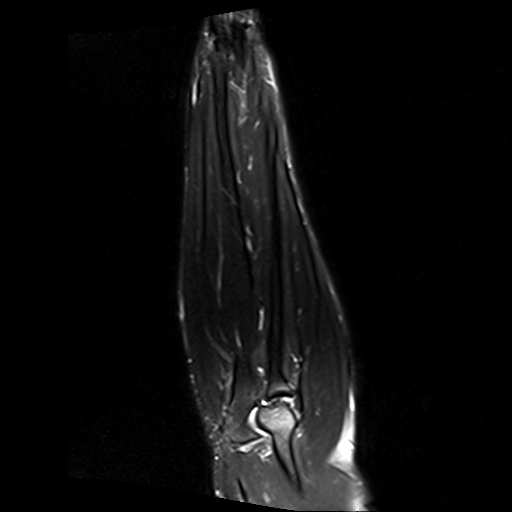
[im 14/23]
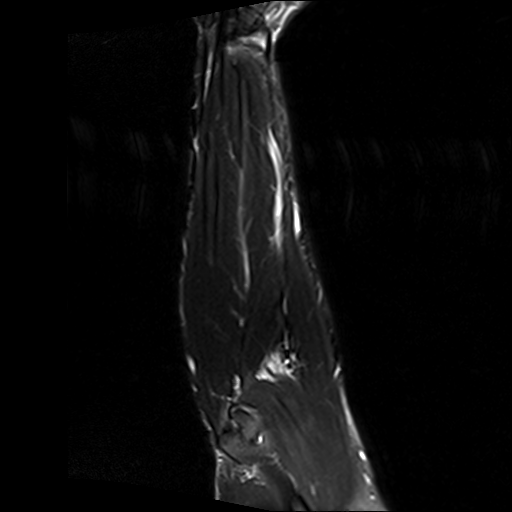
[im 18/23]
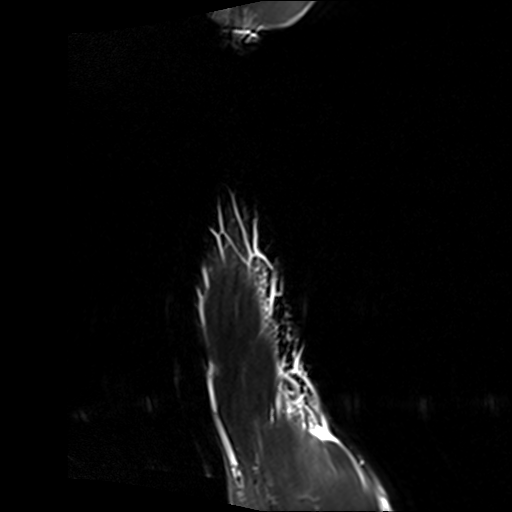
[im 23/23]
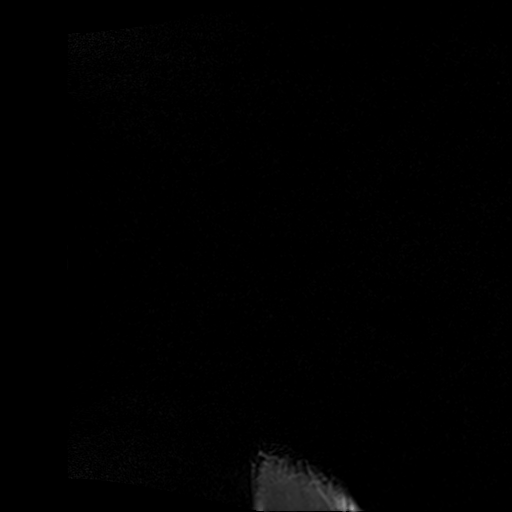

[30 of 40 positions shown; findings below may reference images not displayed]

FINDINGS: There is a complete rupture of the distal biceps tendon from the
attachment on the radial tuberosity. The tendon is retracted
approximately a 4 cm. This is best seen on image 12 of series 14.
There is edema and/or hemorrhage into the soft tissues around the
horn and tendon. Osseous structures are normal. No joint effusion.
Muscles appear normal.
IMPRESSION: Complete rupture of the distal biceps tendon with 4 cm of
retraction.

## 2016-08-26 ENCOUNTER — Telehealth: Payer: Self-pay

## 2016-08-26 NOTE — Telephone Encounter (Signed)
Left message to call and do [facility]

## 2016-08-31 ENCOUNTER — Telehealth: Payer: Self-pay

## 2016-08-31 NOTE — Telephone Encounter (Signed)
Left message to call [facility]

## 2016-09-12 ENCOUNTER — Other Ambulatory Visit: Payer: Self-pay | Admitting: *Deleted

## 2017-08-16 ENCOUNTER — Other Ambulatory Visit: Payer: Self-pay | Admitting: Family Medicine

## 2017-11-20 ENCOUNTER — Other Ambulatory Visit: Payer: Self-pay | Admitting: Family Medicine

## 2017-12-31 ENCOUNTER — Emergency Department
Admission: EM | Admit: 2017-12-31 | Discharge: 2017-12-31 | Disposition: A | Discharge status: Home / Self Care | Payer: Self-pay | Attending: Emergency Medicine | Admitting: Emergency Medicine

## 2017-12-31 ENCOUNTER — Emergency Department: Payer: Self-pay

## 2017-12-31 DIAGNOSIS — R509 Fever, unspecified: Secondary | ICD-10-CM | POA: Insufficient documentation

## 2017-12-31 DIAGNOSIS — I10 Essential (primary) hypertension: Secondary | ICD-10-CM | POA: Insufficient documentation

## 2017-12-31 DIAGNOSIS — Z79899 Other long term (current) drug therapy: Secondary | ICD-10-CM | POA: Insufficient documentation

## 2017-12-31 DIAGNOSIS — Z7982 Long term (current) use of aspirin: Secondary | ICD-10-CM | POA: Insufficient documentation

## 2017-12-31 DIAGNOSIS — N3090 Cystitis, unspecified without hematuria: Secondary | ICD-10-CM | POA: Insufficient documentation

## 2017-12-31 LAB — COMPREHENSIVE METABOLIC PANEL
ALT: 44 U/L (ref 17–63)
AST: 34 U/L (ref 15–41)
Albumin: 3.3 g/dL — ABNORMAL LOW (ref 3.5–5.0)
Alkaline Phosphatase: 80 U/L (ref 38–126)
Anion gap: 9 (ref 5–15)
BILIRUBIN TOTAL: 0.7 mg/dL (ref 0.3–1.2)
BUN: 11 mg/dL (ref 6–20)
CO2: 25 mmol/L (ref 22–32)
Calcium: 8.2 mg/dL — ABNORMAL LOW (ref 8.9–10.3)
Chloride: 98 mmol/L — ABNORMAL LOW (ref 101–111)
Creatinine, Ser: 1.04 mg/dL (ref 0.61–1.24)
Glucose, Bld: 158 mg/dL — ABNORMAL HIGH (ref 65–99)
Potassium: 3.2 mmol/L — ABNORMAL LOW (ref 3.5–5.1)
Sodium: 132 mmol/L — ABNORMAL LOW (ref 135–145)
TOTAL PROTEIN: 7.5 g/dL (ref 6.5–8.1)

## 2017-12-31 LAB — URINALYSIS, COMPLETE (UACMP) WITH MICROSCOPIC
BILIRUBIN URINE: NEGATIVE
GLUCOSE, UA: NEGATIVE mg/dL
Ketones, ur: NEGATIVE mg/dL
NITRITE: NEGATIVE
PH: 6 (ref 5.0–8.0)
Protein, ur: 30 mg/dL — AB
RBC / HPF: >50 RBC/hpf — ABNORMAL HIGH (ref 0–5)
SPECIFIC GRAVITY, URINE: 1.012 (ref 1.005–1.030)
Squamous Epithelial / LPF: NONE SEEN (ref 0–5)
WBC, UA: >50 WBC/hpf — ABNORMAL HIGH (ref 0–5)

## 2017-12-31 LAB — CBC
HCT: 45.1 % (ref 40.0–52.0)
Hemoglobin: 15.5 g/dL (ref 13.0–18.0)
MCH: 27.9 pg (ref 26.0–34.0)
MCHC: 34.3 g/dL (ref 32.0–36.0)
MCV: 81.4 fL (ref 80.0–100.0)
PLATELETS: 262 10*3/uL (ref 150–440)
RBC: 5.54 MIL/uL (ref 4.40–5.90)
RDW: 14.1 % (ref 11.5–14.5)
WBC: 8.1 10*3/uL (ref 3.8–10.6)

## 2017-12-31 LAB — LIPASE, BLOOD: Lipase: 27 U/L (ref 11–51)

## 2017-12-31 MED ORDER — ONDANSETRON 4 MG PO TBDP: 4.0000 mg | ORAL_TABLET | Freq: Once | ORAL | Status: DC | PRN

## 2017-12-31 MED ORDER — IOPAMIDOL (ISOVUE-370) INJECTION 76%
100.0000 mL | Freq: Once | INTRAVENOUS | Status: AC | PRN
  Administered 2017-12-31: 100 mL via INTRAVENOUS
  Filled 2017-12-31: qty 100

## 2017-12-31 MED ORDER — SODIUM CHLORIDE 0.9 % IV BOLUS
1000.0000 mL | Freq: Once | INTRAVENOUS | Status: AC
  Administered 2017-12-31: 1000 mL via INTRAVENOUS

## 2017-12-31 MED ORDER — IOPAMIDOL (ISOVUE-300) INJECTION 61%
30.0000 mL | Freq: Once | INTRAVENOUS | Status: AC | PRN
  Administered 2017-12-31: 30 mL via ORAL
  Filled 2017-12-31: qty 30

## 2017-12-31 MED ORDER — SODIUM CHLORIDE 0.9 % IV SOLN
1.0000 g | Freq: Once | INTRAVENOUS | Status: AC
  Administered 2017-12-31: 1 g via INTRAVENOUS
  Filled 2017-12-31: qty 10

## 2017-12-31 MED ORDER — LEVOFLOXACIN 500 MG PO TABS: 500.0000 mg | ORAL_TABLET | Freq: Every day | ORAL | 0 refills | Status: AC

## 2017-12-31 MED ORDER — ONDANSETRON 4 MG PO TBDP: 4.0000 mg | ORAL_TABLET | Freq: Three times a day (TID) | ORAL | 0 refills | Status: DC | PRN

## 2017-12-31 NOTE — ED Notes (Signed)
Patient transported to CT 

## 2017-12-31 NOTE — ED Triage Notes (Signed)
Pt presents via POV c/o "dehydration". Reports having "fever" in abd. Reports emesis x1 PTA. Also reports some diarrhea. PT A&Ox4 in triage. Denies abd pain. Reports no pain, just fever. Wife reports tmax 102 at home yesterday. No temps taken today. Pt reports eating but less than normal.

## 2017-12-31 NOTE — ED Provider Notes (Signed)
[facility] Emergency Department Provider Note  Time seen: 5:54 PM  I have reviewed the triage vital signs and the nursing notes.   HISTORY  Chief Complaint Emesis and Fever    HPI Charles STANKEY [name] is a 52 y.o. male with a past medical history of hypertension who presents to the emergency department for abdominal discomfort nausea vomiting and diarrhea.  According to the patient for the past 3 to 4 days he has been experiencing abdominal discomfort which he describes mostly as a bloating or stretching type pain moderate in severity across his entire abdomen.  Also states nausea with vomiting as well as several episodes of diarrhea.  Denies any black or bloody stool or vomiting.  Patient states subjective fever at home, states they checked his temperature on the forehead and it was normal, but they checked it on the abdomen and it read elevated.  Patient denies any dysuria or hematuria but does state urinary frequency feels like he needs to urinate but only a little bit comes out each time.  Currently describes his abdominal discomfort is mild stretching/bloating type pain.   Past Medical History:  Diagnosis Date  . Hypertension     Patient Active Problem List   Diagnosis Date Noted  . Hypertension 04/03/2015  . Essential (primary) hypertension 04/03/2015  . Biceps tendon rupture 03/30/2015  . Achrochordon 02/04/2015  . Bacterial skin infection of leg 02/04/2015  . Edema of foot 02/04/2015  . Blood pressure elevated 02/04/2015  . Adult BMI 30+ 02/04/2015  . Enteritis presumed infectious 03/21/2006    Past Surgical History:  Procedure Laterality Date  . DISTAL BICEPS TENDON REPAIR Right 03/31/2015   Procedure: DISTAL BICEPS TENDON REPAIR;  Surgeon: Christena Flake, MD;  Location: [facility] ORS;  Service: Orthopedics;  Laterality: Right;  . ELBOW SURGERY Right 1975  . LEG SURGERY Right 1971    Prior to Admission medications   Medication Sig Start Date End  Date Taking? Authorizing Provider  aspirin 325 MG tablet Take 325 mg by mouth daily.    [provider]  lisinopril-hydrochlorothiazide (PRINZIDE,ZESTORETIC) 10-12.5 MG tablet TAKE 1 TABLET BY MOUTH DAILY. PATIENT MUST SCHEDULE FOLLOW UP APPOINTMENT AND LABS FOR FURTHER REFILLS 11/20/17   Chrismon, Jodell Cipro, PA    No Known Allergies  Family History  Problem Relation Age of Onset  . Breast cancer Mother   . Cancer Father   . Diabetes Father     Social History Social History   Tobacco Use  . Smoking status: Never Smoker  . Smokeless tobacco: Current User    Types: Chew  . Tobacco comment: has been using for 8 years  Substance Use Topics  . Alcohol use: No    Alcohol/week: 0.0 oz  . Drug use: No    Review of Systems Constitutional: Subjective fever Eyes: Negative for visual complaints ENT: Negative for recent illness/congestion Cardiovascular: Negative for chest pain. Respiratory: Negative for shortness of breath. Gastrointestinal: Abdominal bloating/stretching.  Nausea vomiting.  Diarrhea. Genitourinary: Negative for urinary compaints Musculoskeletal: Negative for musculoskeletal complaints Skin: Negative for skin complaints  Neurological: Negative for headache  All other ROS negative  ____________________________________________   PHYSICAL EXAM:  VITAL SIGNS: ED Triage Vitals  Enc Vitals Group     BP 12/31/17 1614 (!) 148/94     Pulse Rate 12/31/17 1614 82     Resp 12/31/17 1614 14     Temp 12/31/17 1614 99.4 F (37.4 C)     Temp Source 12/31/17  1614 Oral     SpO2 12/31/17 1614 97 %     Weight 12/31/17 1615 285 lb (129.3 kg)     Height 12/31/17 1615  (1.93 m)     Head Circumference --      Peak Flow --      Pain Score 12/31/17 1615 0     Pain Loc --      Pain Edu? --      Excl. in GC? --     Constitutional: Alert and oriented. Well appearing and in no distress. Eyes: Normal exam ENT   Head: Normocephalic and atraumatic.    Mouth/Throat: Mucous membranes are moist. Cardiovascular: Normal rate, regular rhythm. No murmur Respiratory: Normal respiratory effort without tachypnea nor retractions. Breath sounds are clear Gastrointestinal: Soft, mild diffuse tenderness without focal tenderness identified.  No rebound or guarding.  No distention.  No CVA tenderness. Musculoskeletal: Nontender with normal range of motion in all extremities.  Neurologic:  Normal speech and language. No gross focal neurologic deficits Skin:  Skin is warm, dry and intact.  Psychiatric: Mood and affect are normal.   ____________________________________________     RADIOLOGY  CT concerning for cystitis with ascending urinary tract infection  ____________________________________________   INITIAL IMPRESSION / ASSESSMENT AND PLAN / ED COURSE  Pertinent labs & imaging results that were available during my care of the patient were reviewed by me and considered in my medical decision making (see chart for details).  Patient presents to the emergency department for abdominal discomfort, bloating/stretching.  Differential is quite broad would include gastroenteritis, colitis, diverticulitis, urinary tract infection or pyelonephritis.  Patient's labs are reassuring, normal white blood cell count, normal chemistry as well as lipase.  We will obtain a urinalysis perform a CT image of the abdomen to help evaluate for colitis/diverticulitis in addition other intra-abdominal pathology.  We will IV hydrate and continue to closely monitor in the emergency department.  Patient agreeable to this plan of care.  CT concerning for cystitis with ascending urinary tract infection.  We will dose IV antibiotics.  We will send a urine culture.  Overall the patient appears well, vitals are reassuring besides a slight low-grade temperature 99.4.  Normal white blood cell count.  I believe patient will tolerate oral antibiotics and likely will be discharged home after  IV antibiotics.  I discussed this with the patient is agreeable to this plan of care. ____________________________________________   FINAL CLINICAL IMPRESSION(S) / ED DIAGNOSES  Abdominal pain Nausea vomiting diarrhea    [name], MD 12/31/17 1924

## 2017-12-31 NOTE — ED Notes (Signed)
CT notified pt finished with contrast. 

## 2017-12-31 NOTE — ED Notes (Signed)
Pt given urine sample cup to give sample.

## 2018-01-03 LAB — URINE CULTURE: Culture: >=100000 — AB

## 2018-02-12 ENCOUNTER — Other Ambulatory Visit: Payer: Self-pay | Admitting: Family Medicine

## 2018-02-15 ENCOUNTER — Ambulatory Visit (INDEPENDENT_AMBULATORY_CARE_PROVIDER_SITE_OTHER): Payer: Self-pay | Admitting: Family Medicine

## 2018-02-15 ENCOUNTER — Encounter: Payer: Self-pay | Admitting: Family Medicine

## 2018-02-15 VITALS — BP 164/90 | HR 84 | Temp 98.6°F | Ht 76.0 in | Wt 284.2 lb

## 2018-02-15 DIAGNOSIS — E669 Obesity, unspecified: Secondary | ICD-10-CM

## 2018-02-15 DIAGNOSIS — I10 Essential (primary) hypertension: Secondary | ICD-10-CM

## 2018-02-15 MED ORDER — LISINOPRIL-HYDROCHLOROTHIAZIDE 10-12.5 MG PO TABS: ORAL_TABLET | ORAL | 3 refills | Status: DC

## 2018-02-15 NOTE — Progress Notes (Signed)
Patient: Charles [name]. Male    DOB: 02-Apr-1966   52 y.o.   MRN: [id number] Visit Date: 02/15/2018  Today's Provider: Dortha Kern, PA   Chief Complaint  Patient presents with  . Hypertension  . Follow-up   Subjective:    HPI  Hypertension, follow-up:  BP Readings from Last 3 Encounters:  02/15/18 (!) 164/90  12/31/17 (!) 161/73  07/29/16 (!) 142/86    He was last seen for hypertension more than 1 years ago.  BP at that visit was 142/86. Management changes since that visit include continue medication and get labs done. He reports fair compliance with treatment. Patient did not get labs done. He is not having side effects.  He is exercising. He is adherent to low salt diet.   Outside blood pressures are being checked at times. He is experiencing none.  Patient denies chest pain, chest pressure/discomfort, claudication, dyspnea, exertional chest pressure/discomfort, fatigue, irregular heart beat, lower extremity edema, near-syncope, orthopnea, palpitations, paroxysmal nocturnal dyspnea, syncope and tachypnea.   Cardiovascular risk factors include hypertension, male gender and obesity (BMI >= 30 kg/m2).   Weight trend: stable Wt Readings from Last 3 Encounters:  02/15/18 284 lb 3.2 oz (128.9 kg)  12/31/17 285 lb (129.3 kg)  07/29/16 283 lb (128.4 kg)   ------------------------------------------------------------------------ Past Medical History:  Diagnosis Date  . Hypertension    Patient Active Problem List   Diagnosis Date Noted  . Hypertension 04/03/2015  . Essential (primary) hypertension 04/03/2015  . Biceps tendon rupture 03/30/2015  . Achrochordon 02/04/2015  . Bacterial skin infection of leg 02/04/2015  . Edema of foot 02/04/2015  . Blood pressure elevated 02/04/2015  . Adult BMI 30+ 02/04/2015  . Enteritis presumed infectious 03/21/2006   Past Surgical History:  Procedure Laterality Date  . DISTAL BICEPS TENDON REPAIR Right 03/31/2015   Procedure: DISTAL BICEPS TENDON REPAIR;  Surgeon: Christena Flake, MD;  Location: [facility] ORS;  Service: Orthopedics;  Laterality: Right;  . ELBOW SURGERY Right 1975  . LEG SURGERY Right 1971   Family History  Problem Relation Age of Onset  . Breast cancer Mother   . Cancer Father   . Diabetes Father    No Known Allergies  Current Outpatient Medications:  .  aspirin 325 MG tablet, Take 325 mg by mouth daily., Disp: , Rfl:  .  lisinopril-hydrochlorothiazide (PRINZIDE,ZESTORETIC) 10-12.5 MG tablet, TAKE 1 TABLET BY MOUTH DAILY. PATIENT MUST SCHEDULE FOLLOW UP APPOINTMENT AND LABS FOR FURTHER REFILLS, Disp: 90 tablet, Rfl: 0  Review of Systems  Constitutional: Negative.   Respiratory: Negative.   Cardiovascular: Negative.    Social History   Tobacco Use  . Smoking status: Never Smoker  . Smokeless tobacco: Current User    Types: Chew  . Tobacco comment: has been using for 8 years  Substance Use Topics  . Alcohol use: No    Alcohol/week: 0.0 oz   Objective:   BP (!) 164/90 (BP Location: Right Arm, Patient Position: Sitting, Cuff Size: Large)   Pulse 84   Temp 98.6 F (37 C) (Oral)   Ht 6\' 4"  (1.93 m)   Wt 284 lb 3.2 oz (128.9 kg)   SpO2 97%   BMI 34.59 kg/m   Wt Readings from Last 3 Encounters:  02/15/18 284 lb 3.2 oz (128.9 kg)  12/31/17 285 lb (129.3 kg)  07/29/16 283 lb (128.4 kg)    Vitals:   02/15/18 1454  BP: (!) 164/90  Pulse: 84  Temp: 98.6  F (37 C)  TempSrc: Oral  SpO2: 97%  Weight: 284 lb 3.2 oz (128.9 kg)  Height: 6\' 4"  (1.93 m)   Physical Exam  Constitutional: He is oriented to person, place, and time. He appears well-developed and well-nourished.  HENT:  Head: Normocephalic and atraumatic.  Right Ear: External ear normal.  Left Ear: External ear normal.  Nose: Nose normal.  Mouth/Throat: Oropharynx is clear and moist.  Eyes: Pupils are equal, round, and reactive to light. Conjunctivae and EOM are normal. Right eye exhibits no discharge.  Neck:  Normal range of motion. Neck supple. No tracheal deviation present. No thyromegaly present.  Cardiovascular: Normal rate, regular rhythm and intact distal pulses.  Murmur heard. Questionable grade 1/6 systolic murmur at the right 2nd ICS along the sternal border.  Pulmonary/Chest: Effort normal and breath sounds normal. No respiratory distress. He has no wheezes. He has no rales. He exhibits no tenderness.  Abdominal: Soft. He exhibits no distension and no mass. There is no tenderness. There is no rebound and no guarding.  Musculoskeletal: Normal range of motion. He exhibits no edema or tenderness.  Well healed scars on the right arm from past surgery for rupture bicep tendon in 2016. Has FROM and good strength. All pulses 2+ and symmetric.  Lymphadenopathy:    He has no cervical adenopathy.  Neurological: He is alert and oriented to person, place, and time. He has normal reflexes. He displays normal reflexes. No cranial nerve deficit. He exhibits normal muscle tone. Coordination normal.  Skin: Skin is warm and dry. No rash noted. No erythema.  Psychiatric: He has a normal mood and affect. His behavior is normal. Judgment and thought content normal.      Assessment & Plan:     1. Essential hypertension BP elevated today. Has not had Prinzide today and wonder if he has missed dosages in the recent past due to no health insurance. Will get follow up labs. Denies dyspnea, peripheral edema, palpitations or chest pains. Recheck pending lab reports. - lisinopril-hydrochlorothiazide (PRINZIDE,ZESTORETIC) 10-12.5 MG tablet; TAKE 1 TABLET BY MOUTH DAILY.  Dispense: 90 tablet; Refill: 3 - CBC with Differential/Platelet - Comprehensive metabolic panel - TSH  2. Obesity, Class I, BMI 30-34.9 Stable weight. Has been working on a job that is very physically demanding. Looks thinner. Family history positive for father having AK amputation due to diabetes. Will check labs for metabolic disorder/diabetes.  Encouraged to restrict caloric intake. - CBC with Differential/Platelet - Comprehensive metabolic panel - TSH - Hemoglobin A1c     Dortha Kernennis Chrismon, PA  [facility]

## 2018-05-08 ENCOUNTER — Encounter: Payer: Self-pay | Admitting: Family Medicine

## 2018-05-08 ENCOUNTER — Ambulatory Visit (INDEPENDENT_AMBULATORY_CARE_PROVIDER_SITE_OTHER): Payer: Self-pay | Admitting: Family Medicine

## 2018-05-08 VITALS — BP 122/80 | HR 76 | Temp 97.6°F | Resp 16 | Wt 281.6 lb

## 2018-05-08 DIAGNOSIS — L03116 Cellulitis of left lower limb: Secondary | ICD-10-CM

## 2018-05-08 MED ORDER — CEPHALEXIN 500 MG PO CAPS: 500.0000 mg | ORAL_CAPSULE | Freq: Three times a day (TID) | ORAL | 0 refills | Status: DC

## 2018-05-08 NOTE — Progress Notes (Signed)
Chief Complaint  Patient presents with  . Leg Swelling    Patient comes in office today with concerns of swelling , redness and warmth to the touch of his left leg for the past 5 days. Patient reports that when he elevates in his leg swelling goes down, he does not recall injury to skin that could of caused swelling.    Subjective:     Patient ID: Charles Fleet., male   DOB: Mar 23, 1966, 52 y.o.   MRN: [id number]  HPI Has had prior cellulitis in both lower extremities, last 2016. Currently works as a Merchandiser, retail for a Nurse, learning disability. Reports fever and chills at onset with leg redness and swelling showing up later.  Review of Systems     Objective:   Physical Exam  Constitutional: He appears well-developed and well-nourished. No distress.  Cardiovascular:  Pulses:      Dorsalis pedis pulses are 2+ on the left side.       Posterior tibial pulses are 2+ on the left side.  Skin:  Distal third of left lower leg with erythema and mild swelling. Non-tender to the touch.       Assessment:    1. Cellulitis of left lower extremity: start cephalexin    Plan:    To call if not improving over the next few days.

## 2018-05-08 NOTE — Patient Instructions (Signed)
Please let us know if the redness and swelling is not improving over the next few days.

## 2018-05-17 ENCOUNTER — Telehealth: Payer: Self-pay | Admitting: Family Medicine

## 2018-05-17 ENCOUNTER — Other Ambulatory Visit: Payer: Self-pay | Admitting: Family Medicine

## 2018-05-17 MED ORDER — CEPHALEXIN 500 MG PO CAPS: 500.0000 mg | ORAL_CAPSULE | Freq: Three times a day (TID) | ORAL | 0 refills | Status: DC

## 2018-05-17 NOTE — Telephone Encounter (Signed)
Patients wife reports that patient still as some redness around skin but denies swelling or pain. Wife states that she just wants to make sure infection is cleared. Please advise. KW

## 2018-05-17 NOTE — Telephone Encounter (Signed)
done

## 2018-05-17 NOTE — Telephone Encounter (Signed)
Pt's wife Shirlee LimerickMarion contacted office for refill request on the following medications:  cephALEXin (KEFLEX) 500 MG capsule  [facility] [address]  Shirlee LimerickMarion stated pt saw Nadine CountsBob on 05/08/18 and the wound in improving but there is still some redness and they are requesting another round of the medication to make sure the infection is completely gone. Please advise. Thanks TNP

## 2018-05-19 LAB — CBC WITH DIFFERENTIAL/PLATELET
Basophils Absolute: 0 10*3/uL (ref 0.0–0.2)
Basos: 1 %
EOS (ABSOLUTE): 0.1 10*3/uL (ref 0.0–0.4)
Eos: 3 %
HEMATOCRIT: 45.9 % (ref 37.5–51.0)
Hemoglobin: 14.9 g/dL (ref 13.0–17.7)
IMMATURE GRANS (ABS): 0 10*3/uL (ref 0.0–0.1)
IMMATURE GRANULOCYTES: 0 %
LYMPHS: 51 %
Lymphocytes Absolute: 2.5 10*3/uL (ref 0.7–3.1)
MCH: 26.7 pg (ref 26.6–33.0)
MCHC: 32.5 g/dL (ref 31.5–35.7)
MCV: 82 fL (ref 79–97)
MONOCYTES: 6 %
Monocytes Absolute: 0.3 10*3/uL (ref 0.1–0.9)
NEUTROS PCT: 39 %
Neutrophils Absolute: 1.9 10*3/uL (ref 1.4–7.0)
PLATELETS: 268 10*3/uL (ref 150–450)
RBC: 5.59 x10E6/uL (ref 4.14–5.80)
RDW: 14.1 % (ref 12.3–15.4)
WBC: 4.9 10*3/uL (ref 3.4–10.8)

## 2018-05-19 LAB — COMPREHENSIVE METABOLIC PANEL
ALT: 41 IU/L (ref 0–44)
AST: 27 IU/L (ref 0–40)
Albumin/Globulin Ratio: 1.4 (ref 1.2–2.2)
Albumin: 4 g/dL (ref 3.5–5.5)
Alkaline Phosphatase: 77 IU/L (ref 39–117)
BUN/Creatinine Ratio: 12 (ref 9–20)
BUN: 13 mg/dL (ref 6–24)
Bilirubin Total: 0.5 mg/dL (ref 0.0–1.2)
CALCIUM: 9.1 mg/dL (ref 8.7–10.2)
CO2: 23 mmol/L (ref 20–29)
Chloride: 100 mmol/L (ref 96–106)
Creatinine, Ser: 1.05 mg/dL (ref 0.76–1.27)
GFR calc Af Amer: 93 mL/min/{1.73_m2} (ref >59)
GFR, EST NON AFRICAN AMERICAN: 80 mL/min/{1.73_m2} (ref >59)
GLOBULIN, TOTAL: 2.9 g/dL (ref 1.5–4.5)
Glucose: 143 mg/dL — ABNORMAL HIGH (ref 65–99)
Potassium: 3.9 mmol/L (ref 3.5–5.2)
Sodium: 138 mmol/L (ref 134–144)
TOTAL PROTEIN: 6.9 g/dL (ref 6.0–8.5)

## 2018-05-19 LAB — HEMOGLOBIN A1C
ESTIMATED AVERAGE GLUCOSE: 192 mg/dL
HEMOGLOBIN A1C: 8.3 % — AB (ref 4.8–5.6)

## 2018-05-19 LAB — TSH: TSH: 1.62 u[IU]/mL (ref 0.450–4.500)

## 2018-05-21 ENCOUNTER — Telehealth: Payer: Self-pay

## 2018-05-21 NOTE — Telephone Encounter (Signed)
Patient advised as below. Patient verbalizes understanding and is in agreement with treatment plan.  

## 2018-05-21 NOTE — Telephone Encounter (Signed)
lmtcb

## 2018-05-21 NOTE — Telephone Encounter (Signed)
-----   Message from Tamsen Roersennis E [place], [place] sent at 05/20/2018  7:23 PM EDT ----- Blood sugar high and Hgb A1C confirms diabetes. Need to schedule appointment in the next 30 days to go over diabetic diet, exercise program and start Metformin 500 mg qd #30 & 3 RF.

## 2018-05-22 ENCOUNTER — Telehealth: Payer: Self-pay | Admitting: Family Medicine

## 2018-05-22 NOTE — Telephone Encounter (Signed)
Patient wants to talk to someone regarding lab results.   Wife has already talked to someone but patient wants to talk to a CMA

## 2018-05-22 NOTE — Telephone Encounter (Signed)
No answer or voicemail set up at home or mobile number. Try again to answer patient's questions or schedule an appointment time to come in for conference regarding new diagnosis of diabetes.

## 2018-05-23 NOTE — Telephone Encounter (Signed)
NA

## 2018-05-24 NOTE — Telephone Encounter (Signed)
Pt has apt on 06/01/2018  Thanks,   -Vernona RiegerLaura

## 2018-06-01 ENCOUNTER — Encounter: Payer: Self-pay | Admitting: Family Medicine

## 2018-06-01 ENCOUNTER — Ambulatory Visit (INDEPENDENT_AMBULATORY_CARE_PROVIDER_SITE_OTHER): Payer: Self-pay | Admitting: Family Medicine

## 2018-06-01 VITALS — BP 138/78 | HR 78 | Temp 98.4°F | Wt 282.0 lb

## 2018-06-01 DIAGNOSIS — Z23 Encounter for immunization: Secondary | ICD-10-CM

## 2018-06-01 DIAGNOSIS — I10 Essential (primary) hypertension: Secondary | ICD-10-CM

## 2018-06-01 DIAGNOSIS — E1169 Type 2 diabetes mellitus with other specified complication: Secondary | ICD-10-CM

## 2018-06-01 LAB — POCT UA - MICROALBUMIN: Microalbumin Ur, POC: 50 mg/L

## 2018-06-01 MED ORDER — METFORMIN HCL 500 MG PO TABS: 500.0000 mg | ORAL_TABLET | Freq: Every day | ORAL | 3 refills | Status: DC

## 2018-06-01 MED ORDER — GLUCOSE BLOOD VI STRP: ORAL_STRIP | 4 refills | Status: DC

## 2018-06-01 NOTE — Progress Notes (Signed)
Patient: Charles [name]. Male    DOB: 1966/03/29   52 y.o.   MRN: [id number] Visit Date: 06/01/2018  Today's Provider: Dortha Kern, PA   Chief Complaint  Patient presents with  . Diabetes   Subjective:    Diabetes  He presents for his initial diabetic visit. He has type 2 diabetes mellitus. His disease course has been worsening. There are no hypoglycemic associated symptoms. Pertinent negatives for hypoglycemia include no dizziness or headaches. Pertinent negatives for diabetes include no blurred vision, no chest pain, no fatigue, no foot paresthesias, no foot ulcerations, no polydipsia, no polyphagia, no polyuria, no visual change, no weakness and no weight loss. An ACE inhibitor/angiotensin II receptor blocker is being taken. Eye exam is not current.      Past Medical History:  Diagnosis Date  . Hypertension    Past Surgical History:  Procedure Laterality Date  . DISTAL BICEPS TENDON REPAIR Right 03/31/2015   Procedure: DISTAL BICEPS TENDON REPAIR;  Surgeon: Christena Flake, MD;  Location: [facility] ORS;  Service: Orthopedics;  Laterality: Right;  . ELBOW SURGERY Right 1975  . LEG SURGERY Right 1971   Family History  Problem Relation Age of Onset  . Breast cancer Mother   . Cancer Father   . Diabetes Father    No Known Allergies  Current Outpatient Medications:  .  aspirin 325 MG tablet, Take 325 mg by mouth daily., Disp: , Rfl:  .  lisinopril-hydrochlorothiazide (PRINZIDE,ZESTORETIC) 10-12.5 MG tablet, TAKE 1 TABLET BY MOUTH DAILY., Disp: 90 tablet, Rfl: 3 .  glucose blood test strip, Test fasting sugar before breakfast daily., Disp: 100 each, Rfl: 4 .  metFORMIN (GLUCOPHAGE) 500 MG tablet, Take 1 tablet (500 mg total) by mouth daily with breakfast., Disp: 90 tablet, Rfl: 3  Review of Systems  Constitutional: Negative.  Negative for fatigue and weight loss.  Eyes: Negative for blurred vision.  Respiratory: Negative.   Cardiovascular: Negative.  Negative for  chest pain.  Gastrointestinal: Negative.   Endocrine: Negative.  Negative for polydipsia, polyphagia and polyuria.  Musculoskeletal: Negative.   Neurological: Negative for dizziness, weakness, light-headedness and headaches.   Social History   Tobacco Use  . Smoking status: Never Smoker  . Smokeless tobacco: Current User    Types: Chew  . Tobacco comment: has been using for 8 years  Substance Use Topics  . Alcohol use: No    Alcohol/week: 0.0 standard drinks   Objective:   BP 138/78 (BP Location: Right Arm, Patient Position: Sitting, Cuff Size: Large)   Pulse 78   Temp 98.4 F (36.9 C) (Oral)   Wt 282 lb (127.9 kg)   SpO2 97%   BMI 34.33 kg/m   Wt Readings from Last 3 Encounters:  06/01/18 282 lb (127.9 kg)  05/08/18 281 lb 9.6 oz (127.7 kg)  02/15/18 284 lb 3.2 oz (128.9 kg)   Vitals:   06/01/18 0946  BP: 138/78  Pulse: 78  Temp: 98.4 F (36.9 C)  TempSrc: Oral  SpO2: 97%  Weight: 282 lb (127.9 kg)   Physical Exam  Constitutional: He is oriented to person, place, and time. He appears well-developed and well-nourished. No distress.  HENT:  Head: Normocephalic and atraumatic.  Right Ear: Hearing normal.  Left Ear: Hearing normal.  Nose: Nose normal.  Eyes: Conjunctivae and lids are normal. Right eye exhibits no discharge. Left eye exhibits no discharge. No scleral icterus.  Neck: Neck supple.  Cardiovascular: Normal rate and regular  rhythm.  Pulmonary/Chest: Effort normal and breath sounds normal. No respiratory distress.  Abdominal: Soft. Bowel sounds are normal.  Musculoskeletal: Normal range of motion.  Slight puffiness of feet and lower legs. No sign of infection. Good pulses.  Neurological: He is alert and oriented to person, place, and time.  Skin: Skin is intact. No lesion and no rash noted.  Psychiatric: He has a normal mood and affect. His speech is normal and behavior is normal. Thought content normal.     Diabetic Foot Form - Detailed   Diabetic  Foot Exam - detailed Diabetic Foot exam was performed with the following findings:  Yes 06/01/2018 10:50 AM  Visual Foot Exam completed.:  Yes  Can the patient see the bottom of their feet?:  Yes Are the shoes appropriate in style and fit?:  Yes Is there swelling or and abnormal foot shape?:  No Is there a claw toe deformity?:  Yes (Comment: Bunion left first MTP joint medially.) Is there elevated skin temparature?:  No Is there foot or ankle muscle weakness?:  No Normal Range of Motion:  Yes Pulse Foot Exam completed.:  Yes  Right posterior Tibialias:  Present Left posterior Tibialias:  Present  Right Dorsalis Pedis:  Present Left Dorsalis Pedis:  Present  Sensory Foot Exam Completed.:  Yes Semmes-Weinstein Monofilament Test R Site 1-Great Toe:  Pos L Site 1-Great Toe:  Pos       Assessment & Plan:     1. Type 2 diabetes mellitus with other specified complication, without long-term current use of insulin (HCC) No polyuria, vision changes, peripheral neuropathy or polydipsia. Labs on 05-18-18 showed glucose 143 with Hgb A1C 8.3%. Started diabetic diet and Metformin 500 mg qd by 05-20-18. No hypoglycemic episodes. Urine Microalbumen 50 mg/L today (still on ACE-I). Encouraged to exercise 30-40 minutes 4-5 days a week and get eye exam with ophthalmologist annually. Continue present Metformin dosage and recommend testing FBS each morning. Recheck in 3 months. - metFORMIN (GLUCOPHAGE) 500 MG tablet; Take 1 tablet (500 mg total) by mouth daily with breakfast.  Dispense: 90 tablet; Refill: 3 - glucose blood test strip; Test fasting sugar before breakfast daily.  Dispense: 100 each; Refill: 4 - POCT UA - Microalbumin  2. Essential (primary) hypertension Tolerating the Lisinopril-HCTZ 10-12.5 mg qd without side effects (no cough or swelling/hives). Creatinine 1.05, Na+ 138 and K+ 3.9 on 05-18-18. No palpitations, chest pain, dyspnea or edema. Continue this medication with salt restriction and continue  to work on weight control. Recheck in 3 months.  3. Needs flu shot - Flu Vaccine QUAD 36+ mos IM        Dortha Kern, PA  [facility]

## 2018-06-19 LAB — HM DIABETES EYE EXAM

## 2018-06-25 DIAGNOSIS — E1165 Type 2 diabetes mellitus with hyperglycemia: Secondary | ICD-10-CM

## 2018-06-25 DIAGNOSIS — IMO0002 Reserved for concepts with insufficient information to code with codable children: Secondary | ICD-10-CM | POA: Insufficient documentation

## 2018-12-03 ENCOUNTER — Other Ambulatory Visit: Payer: Self-pay

## 2018-12-03 ENCOUNTER — Encounter: Payer: Self-pay | Admitting: Family Medicine

## 2018-12-03 ENCOUNTER — Ambulatory Visit (INDEPENDENT_AMBULATORY_CARE_PROVIDER_SITE_OTHER): Payer: BLUE CROSS/BLUE SHIELD | Admitting: Family Medicine

## 2018-12-03 VITALS — BP 110/70 | HR 91 | Temp 98.4°F | Resp 16 | Wt 276.0 lb

## 2018-12-03 DIAGNOSIS — H6692 Otitis media, unspecified, left ear: Secondary | ICD-10-CM

## 2018-12-03 DIAGNOSIS — R197 Diarrhea, unspecified: Secondary | ICD-10-CM | POA: Diagnosis not present

## 2018-12-03 DIAGNOSIS — J301 Allergic rhinitis due to pollen: Secondary | ICD-10-CM | POA: Diagnosis not present

## 2018-12-03 MED ORDER — AMOXICILLIN 875 MG PO TABS: 875.0000 mg | ORAL_TABLET | Freq: Two times a day (BID) | ORAL | 0 refills | Status: DC

## 2018-12-03 NOTE — Progress Notes (Signed)
Patient: Charles [name]. Male    DOB: [birth year]   53 y.o.   MRN: [id number] Visit Date: 12/03/2018  Today's Provider: Dortha Kern, PA   Chief Complaint  Patient presents with  . Diarrhea  . Sinusitis   Subjective:     Diarrhea   This is a new problem. The current episode started yesterday. Associated symptoms include bloating and increased flatus. Pertinent negatives include no abdominal pain, arthralgias, chills, coughing, fever, headaches, myalgias, sweats, URI, vomiting or weight loss.   Patient states he has increased stools for 2 days. Patient states stools are loose and he has to go to the bathroom every 15 minutes at times. Patient states he has bloating and increased gas.   Also, having congestion after riding in car with the windows down over the past weekend. No fever, cough, wheezing, aches or shortness of breath. 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  Past Medical History:  Diagnosis Date  . Hypertension    Past Surgical History:  Procedure Laterality Date  . DISTAL BICEPS TENDON REPAIR Right 03/31/2015   Procedure: DISTAL BICEPS TENDON REPAIR;  Surgeon: Christena Flake, MD;  Location: [facility] ORS;  Service: Orthopedics;  Laterality: Right;  . ELBOW SURGERY Right 1975  . LEG SURGERY Right 1971   Family History  Problem Relation Age of Onset  . Breast cancer Mother   . Cancer Father   . Diabetes Father    No Known Allergies  Current Outpatient Medications:  .  aspirin 325 MG tablet, Take 325 mg by mouth daily., Disp: , Rfl:  .  glucose blood test strip, Test fasting sugar before breakfast daily., Disp: 100 each, Rfl: 4 .  lisinopril-hydrochlorothiazide (PRINZIDE,ZESTORETIC) 10-12.5 MG tablet, TAKE 1 TABLET BY MOUTH DAILY., Disp: 90 tablet, Rfl: 3 .  metFORMIN (GLUCOPHAGE) 500 MG tablet, Take 1 tablet (500 mg total) by mouth daily  with breakfast., Disp: 90 tablet, Rfl: 3  Review of Systems  Constitutional: Negative for appetite change, chills, fever and weight loss.  Respiratory: Negative for cough, chest tightness, shortness of breath and wheezing.   Cardiovascular: Negative for chest pain and palpitations.  Gastrointestinal: Positive for bloating, diarrhea and flatus. Negative for abdominal pain, nausea and vomiting.  Musculoskeletal: Negative for arthralgias and myalgias.  Neurological: Negative for headaches.   Social History   Tobacco Use  . Smoking status: Never Smoker  . Smokeless tobacco: Current User    Types: Chew  . Tobacco comment: has been using for 8 years  Substance Use Topics  . Alcohol use: No    Alcohol/week: 0.0 standard drinks     Objective:   BP 110/70 (BP Location: Right Arm, Patient Position: Sitting, Cuff Size: Large)   Pulse 91   Temp 98.4 F (36.9 C) (Oral)   Resp 16   Wt 276 lb (125.2 kg)   SpO2 96%   BMI 33.60 kg/m    Wt Readings from Last 3 Encounters:  12/03/18 276 lb (125.2 kg)  06/01/18 282 lb (127.9 kg)  05/08/18 281 lb 9.6 oz (127.7 kg)   Vitals:   12/03/18 1041  BP: 110/70  Pulse: 91  Resp: 16  Temp: 98.4 F (36.9 C)  TempSrc: Oral  SpO2: 96%  Weight: 276 lb (125.2 kg)   Physical Exam Constitutional:      General: He is not in acute distress.  Appearance: He is well-developed.  HENT:     Head: Normocephalic and atraumatic.     Right Ear: Hearing and tympanic membrane normal.     Left Ear: Hearing normal.     Ears:     Comments: Left TM dull reflex with slight redness and slight stopped up sensation. No significant hearing loss.    Nose: Nose normal.     Mouth/Throat:     Pharynx: Oropharynx is clear.  Eyes:     General: Lids are normal. No scleral icterus.       Right eye: No discharge.        Left eye: No discharge.     Conjunctiva/sclera: Conjunctivae normal.  Neck:     Musculoskeletal: Normal range of motion and neck supple.   Cardiovascular:     Rate and Rhythm: Normal rate and regular rhythm.     Pulses: Normal pulses.     Heart sounds: Normal heart sounds.  Pulmonary:     Effort: Pulmonary effort is normal. No respiratory distress.  Abdominal:     General: Bowel sounds are normal.     Palpations: Abdomen is soft.  Musculoskeletal: Normal range of motion.  Skin:    Findings: No lesion or rash.  Neurological:     Mental Status: He is alert and oriented to person, place, and time.  Psychiatric:        Speech: Speech normal.        Behavior: Behavior normal.        Thought Content: Thought content normal.       Assessment & Plan     1. Diarrhea, unspecified type Has had an increase in BM's the past couple days. No liquid stools. Only soft formed stools a couple times a day. No nausea, vomiting, abdominal pains, fever, body aches or hematochezia. Recommend more fiber in diet and probiotic supplements to regulate stool habits.  2. Allergic rhinitis due to pollen, unspecified seasonality Some sinus congestion with scratchy throat the past 24-36 hours. Started after riding in his car with the windows down yesterday. No cough, fever, wheezing, headache or shortness of breath. Employer wanted him screened and examined (works in a [employer] company ([employer]). Recommend using Claritin, Nasal Steroid Spray and Delsym for symptoms. May use sinus irrigation if needed.  3. Left otitis media, unspecified otitis media type Some ache in the left ear over the past day or two. Redness of the TM on exam. Will give Amoxil and should use allergy medications. Recheck prn. - amoxicillin (AMOXIL) 875 MG tablet; Take 1 tablet (875 mg total) by mouth 2 (two) times daily.  Dispense: 20 tablet; Refill: 0     Dortha Kern, PA  [facility]

## 2019-02-24 ENCOUNTER — Other Ambulatory Visit: Payer: Self-pay | Admitting: Family Medicine

## 2019-02-24 DIAGNOSIS — I10 Essential (primary) hypertension: Secondary | ICD-10-CM

## 2019-03-04 ENCOUNTER — Telehealth: Payer: Self-pay

## 2019-03-04 NOTE — Telephone Encounter (Signed)
[name]. Charles [name] stated that the patient will need appointment to document the need for the Durable Medical Equipment.

## 2019-03-07 ENCOUNTER — Telehealth: Payer: Self-pay

## 2019-03-07 NOTE — Telephone Encounter (Signed)
[place] that we are waiting to hear back from the patient as we will need to see him to get something documented regarding the need for DME

## 2019-03-07 NOTE — Telephone Encounter (Signed)
LMTCB

## 2019-03-07 NOTE — Telephone Encounter (Signed)
Charles [name] with [employer] calling to see if the form/letter for patient has been received regarding Orthopedics support? CB# [phone]

## 2019-05-30 ENCOUNTER — Other Ambulatory Visit: Payer: Self-pay | Admitting: Family Medicine

## 2019-05-30 DIAGNOSIS — I10 Essential (primary) hypertension: Secondary | ICD-10-CM

## 2019-07-04 ENCOUNTER — Other Ambulatory Visit: Payer: Self-pay

## 2019-07-04 DIAGNOSIS — Z20822 Contact with and (suspected) exposure to covid-19: Secondary | ICD-10-CM

## 2019-07-05 LAB — NOVEL CORONAVIRUS, NAA: SARS-CoV-2, NAA: NOT DETECTED

## 2019-08-02 ENCOUNTER — Encounter: Payer: Self-pay | Admitting: Family Medicine

## 2019-08-02 ENCOUNTER — Other Ambulatory Visit: Payer: Self-pay

## 2019-08-02 ENCOUNTER — Ambulatory Visit (INDEPENDENT_AMBULATORY_CARE_PROVIDER_SITE_OTHER): Payer: Self-pay | Admitting: Family Medicine

## 2019-08-02 VITALS — BP 130/82 | HR 70 | Temp 97.7°F | Resp 16 | Ht 76.0 in | Wt 280.0 lb

## 2019-08-02 DIAGNOSIS — I10 Essential (primary) hypertension: Secondary | ICD-10-CM

## 2019-08-02 DIAGNOSIS — E1165 Type 2 diabetes mellitus with hyperglycemia: Secondary | ICD-10-CM

## 2019-08-02 DIAGNOSIS — S76212A Strain of adductor muscle, fascia and tendon of left thigh, initial encounter: Secondary | ICD-10-CM

## 2019-08-02 LAB — POCT GLYCOSYLATED HEMOGLOBIN (HGB A1C)
Est. average glucose Bld gHb Est-mCnc: 232
Hemoglobin A1C: 9.7 % — AB (ref 4.0–5.6)

## 2019-08-02 NOTE — Patient Instructions (Signed)

## 2019-08-02 NOTE — Progress Notes (Signed)
Patient: Charles [name]. Male    DOB: 11/05/1965   53 y.o.   MRN: [id number] Visit Date: 08/02/2019  Today's Provider: Dortha Kern, PA   Chief Complaint  Patient presents with  . Follow-up  . Hypertension  . Diabetes   Subjective:     Patient is here for a Follow u pon his A1C and Hypertension. No complaints at this time.   Diabetes Mellitus Type II, Follow-up:   Lab Results  Component Value Date   HGBA1C 9.7 (A) 08/02/2019   HGBA1C 8.3 (H) 05/18/2018   Last seen for diabetes 8 months ago.  Management since then includes Medication and diet change. He reports good compliance with treatment. He is not having side effects.  Current symptoms include none and have been stable.  Episodes of hypoglycemia? no   Most Recent Eye Exam: N/A Weight trend: stable Prior visit with dietician: no Current diet: in general, a "healthy" diet   Current exercise: yard work  ------------------------------------------------------------------------   Hypertension, follow-up:  BP Readings from Last 3 Encounters:  08/02/19 130/82  12/03/18 110/70  06/01/18 138/78    He was last seen for hypertension 8 months ago. . Management since that visit includes Medications and diet changes.He reports good compliance with treatment. He is not having side effects.  He is exercising. He is adherent to low salt diet.  Marland Kitchen He is experiencing none.  Patient denies chest pain, chest pressure/discomfort, claudication, dyspnea, exertional chest pressure/discomfort, fatigue, irregular heart beat, lower extremity edema, near-syncope, orthopnea, palpitations, paroxysmal nocturnal dyspnea, syncope and tachypnea.   Cardiovascular risk factors include none.  Use of agents associated with hypertension: none.   ------------------------------------------------------------------------  Past Medical History:  Diagnosis Date  . Hypertension    Past Surgical History:  Procedure Laterality Date   . DISTAL BICEPS TENDON REPAIR Right 03/31/2015   Procedure: DISTAL BICEPS TENDON REPAIR;  Surgeon: Christena Flake, MD;  Location: [facility] ORS;  Service: Orthopedics;  Laterality: Right;  . ELBOW SURGERY Right 1975  . LEG SURGERY Right 1971   Family History  Problem Relation Age of Onset  . Breast cancer Mother   . Cancer Father   . Diabetes Father    No Known Allergies  Current Outpatient Medications:  .  aspirin 325 MG tablet, Take 325 mg by mouth daily., Disp: , Rfl:  .  glucose blood test strip, Test fasting sugar before breakfast daily., Disp: 100 each, Rfl: 4 .  lisinopril-hydrochlorothiazide (ZESTORETIC) 10-12.5 MG tablet, TAKE 1 TABLET BY MOUTH DAILY, Disp: 90 tablet, Rfl: 0 .  metFORMIN (GLUCOPHAGE) 500 MG tablet, Take 1 tablet (500 mg total) by mouth daily with breakfast., Disp: 90 tablet, Rfl: 3  Review of Systems  Constitutional: Negative.   HENT: Negative.   Eyes: Negative.   Respiratory: Negative.   Cardiovascular: Negative.   Gastrointestinal: Negative.   Endocrine: Negative.   Genitourinary: Negative.   Musculoskeletal: Negative.   Allergic/Immunologic: Negative.   Neurological: Negative.   Hematological: Negative.   Psychiatric/Behavioral: Negative.    Social History   Tobacco Use  . Smoking status: Never Smoker  . Smokeless tobacco: Current User    Types: Chew  . Tobacco comment: has been using for 8 years  Substance Use Topics  . Alcohol use: No    Alcohol/week: 0.0 standard drinks     Objective:   BP 130/82   Pulse 70   Temp 97.7 F (36.5 C) (Temporal)   Resp 16   Ht  6\' 4"  (1.93 m)   Wt 280 lb (127 kg)   BMI 34.08 kg/m  Vitals:   08/02/19 0900  BP: 130/82  Pulse: 70  Resp: 16  Temp: 97.7 F (36.5 C)  TempSrc: Temporal  Weight: 280 lb (127 kg)  Height: 6\' 4"  (1.93 m)  Body mass index is 34.08 kg/m. Wt Readings from Last 3 Encounters:  08/02/19 280 lb (127 kg)  12/03/18 276 lb (125.2 kg)  06/01/18 282 lb (127.9 kg)    Physical  Exam Constitutional:      General: He is not in acute distress.    Appearance: He is well-developed.  HENT:     Head: Normocephalic and atraumatic.     Right Ear: Hearing normal.     Left Ear: Hearing normal.     Nose: Nose normal.  Eyes:     General: Lids are normal. No scleral icterus.       Right eye: No discharge.        Left eye: No discharge.     Conjunctiva/sclera: Conjunctivae normal.  Neck:     Musculoskeletal: Normal range of motion and neck supple.  Cardiovascular:     Rate and Rhythm: Normal rate and regular rhythm.     Heart sounds: Normal heart sounds.  Pulmonary:     Effort: Pulmonary effort is normal. No respiratory distress.     Breath sounds: Normal breath sounds.  Abdominal:     General: Bowel sounds are normal.     Palpations: Abdomen is soft.     Hernia: No hernia is present.  Musculoskeletal: Normal range of motion.  Skin:    Findings: No lesion or rash.  Neurological:     Mental Status: He is alert and oriented to person, place, and time.  Psychiatric:        Speech: Speech normal.        Behavior: Behavior normal.        Thought Content: Thought content normal.    Results for orders placed or performed in visit on 08/02/19  POCT HgB A1C  Result Value Ref Range   Hemoglobin A1C 9.7 (A) 4.0 - 5.6 %   Est. average glucose Bld gHb Est-mCnc 232       Assessment & Plan    1. Uncontrolled type 2 diabetes mellitus with hyperglycemia (HCC) Hgb A1C 9.7% today. Has regained 4 lbs in the past 8 months. Has not taken any of the Metformin. Recommend he follow the diabetic diet more closely, exercise regularly and lose a little more weight. Must take the Metformin 500 mg qd REGULARLY. Foot exam normal. Refuses flu shot and colonoscopy. Will get CBC and CMP fasting. Some financial difficulties maintaining follow up and health maintenance guidelines due to NOT having any health insurance. Encouraged to schedule ophthalmology diabetic exam. - POCT HgB A1C - CBC  with Differential - Comprehensive Metabolic Panel (CMET)  2. Essential (primary) hypertension Very good BP control on the Zestoretic 10-12.5 mg qd. Continue to work on weight loss, regular exercise, restrict salt intake and follow a diabetic diet. Recheck CBC and CMP.  - CBC with Differential - Comprehensive Metabolic Panel (CMET)  3. Inguinal strain, left, initial encounter Recent discomfort in the left inguinal muscles to lift the left leg or as he stands from a seated position. No specific injury known. No sign of lymphadenopathy or inguinal hernia on exam. Apply moist heat or may use Aleve prn. Recheck prn.     Vernie Murders, [employer]  [facility]

## 2019-08-26 ENCOUNTER — Other Ambulatory Visit: Payer: Self-pay | Admitting: Family Medicine

## 2019-08-26 DIAGNOSIS — I10 Essential (primary) hypertension: Secondary | ICD-10-CM

## 2019-08-26 NOTE — Telephone Encounter (Signed)
Pt asking for refills. No information regarding last refill or prescriber

## 2019-08-27 NOTE — Telephone Encounter (Signed)
Requested Prescriptions  Pending Prescriptions Disp Refills  . lisinopril-hydrochlorothiazide (ZESTORETIC) 10-12.5 MG tablet [Pharmacy Med Name: LISINOPRIL-HCTZ 10/12.5MG  TABLETS] 90 tablet 0    Sig: TAKE 1 TABLET BY MOUTH DAILY     Cardiovascular:  ACEI + Diuretic Combos Failed - 08/26/2019  7:20 PM      Failed - Na in normal range and within 180 days    Sodium  Date Value Ref Range Status  05/18/2018 138 134 - 144 mmol/L Final  04/25/2014 137 136 - 145 mmol/L Final         Failed - K in normal range and within 180 days    Potassium  Date Value Ref Range Status  05/18/2018 3.9 3.5 - 5.2 mmol/L Final  04/25/2014 3.7 3.5 - 5.1 mmol/L Final         Failed - Cr in normal range and within 180 days    Creatinine  Date Value Ref Range Status  04/25/2014 1.11 0.60 - 1.30 mg/dL Final   Creatinine, Ser  Date Value Ref Range Status  05/18/2018 1.05 0.76 - 1.27 mg/dL Final         Failed - Ca in normal range and within 180 days    Calcium  Date Value Ref Range Status  05/18/2018 9.1 8.7 - 10.2 mg/dL Final   Calcium, Total  Date Value Ref Range Status  04/25/2014 8.5 8.5 - 10.1 mg/dL Final         Passed - Patient is not pregnant      Passed - Last BP in normal range    BP Readings from Last 1 Encounters:  08/02/19 130/82         Passed - Valid encounter within last 6 months    Recent Outpatient Visits          3 weeks ago Uncontrolled type 2 diabetes mellitus with hyperglycemia ([place])   [place], [place], [place]   8 months ago Diarrhea, unspecified type   [facility], [place], [place]   1 year ago Type 2 diabetes mellitus with other specified complication, without long-term current use of insulin ([place])   [facility], [place], [place]   1 year ago Cellulitis of left lower extremity   [facility] [place], [place]   1 year ago Essential hypertension   [facility],  [place], [place]      Future Appointments            In 2 months [place], Vick[place] Muff, [place], [place]           . lisinopril-hydrochlorothiazide (ZESTORETIC) 10-12.5 MG tablet [Pharmacy Med Name: LISINOPRIL-HCTZ 10/12.5MG  TABLETS] 90 tablet 0    Sig: TAKE 1 TABLET BY MOUTH DAILY     Cardiovascular:  ACEI + Diuretic Combos Failed - 08/26/2019  7:20 PM      Failed - Na in normal range and within 180 days    Sodium  Date Value Ref Range Status  05/18/2018 138 134 - 144 mmol/L Final  04/25/2014 137 136 - 145 mmol/L Final         Failed - K in normal range and within 180 days    Potassium  Date Value Ref Range Status  05/18/2018 3.9 3.5 - 5.2 mmol/L Final  04/25/2014 3.7 3.5 - 5.1 mmol/L Final         Failed - Cr in normal range and within 180 days    Creatinine  Date Value Ref Range Status  04/25/2014 1.11 0.60 - 1.30 mg/dL Final   Creatinine, Ser  Date Value Ref Range Status  05/18/2018 1.05 0.76 - 1.27 mg/dL Final         Failed - Ca in normal range and within 180 days    Calcium  Date Value Ref Range Status  05/18/2018 9.1 8.7 - 10.2 mg/dL Final   Calcium, Total  Date Value Ref Range Status  04/25/2014 8.5 8.5 - 10.1 mg/dL Final         Passed - Patient is not pregnant      Passed - Last BP in normal range    BP Readings from Last 1 Encounters:  08/02/19 130/82         Passed - Valid encounter within last 6 months    Recent Outpatient Visits          3 weeks ago Uncontrolled type 2 diabetes mellitus with hyperglycemia (HCC)   [facility] [place], [place], [place]   8 months ago Diarrhea, unspecified type   [facility], [place], [place]   1 year ago Type 2 diabetes mellitus with other specified complication, without long-term current use of insulin (HCC)   [facility], [place], [place]   1 year ago Cellulitis of left lower extremity   [facility] [place], [place], [place]   1  year ago Essential hypertension   [facility], [place] Cipro, [place]      Future Appointments            In 2 months Chrismon, Jodell [place], [place] [facility], [facility]

## 2019-10-02 ENCOUNTER — Ambulatory Visit (INDEPENDENT_AMBULATORY_CARE_PROVIDER_SITE_OTHER): Payer: Self-pay | Admitting: Physician Assistant

## 2019-10-02 ENCOUNTER — Ambulatory Visit: Payer: Self-pay | Admitting: Physician Assistant

## 2019-10-02 DIAGNOSIS — R300 Dysuria: Secondary | ICD-10-CM

## 2019-10-02 DIAGNOSIS — N12 Tubulo-interstitial nephritis, not specified as acute or chronic: Secondary | ICD-10-CM

## 2019-10-02 LAB — POCT URINALYSIS DIPSTICK
Bilirubin, UA: NEGATIVE
Glucose, UA: NEGATIVE
Ketones, UA: NEGATIVE
Nitrite, UA: POSITIVE
Protein, UA: POSITIVE — AB
Spec Grav, UA: 1.02 (ref 1.010–1.025)
Urobilinogen, UA: 0.2 E.U./dL
pH, UA: 6 (ref 5.0–8.0)

## 2019-10-02 MED ORDER — SULFAMETHOXAZOLE-TRIMETHOPRIM 800-160 MG PO TABS: 1.0000 | ORAL_TABLET | Freq: Two times a day (BID) | ORAL | 0 refills | Status: AC

## 2019-10-02 NOTE — Progress Notes (Signed)
Patient: Charles [name]. Male    DOB: 1966/08/18   54 y.o.   MRN: [id number] Visit Date: 10/02/2019  Today's Provider: Trey Sailors, PA-C   Chief Complaint  Patient presents with  . Dysuria   Subjective:    Virtual Visit via Telephone Note  I connected with Orlene Och [name] on 10/02/19 at 11:00 AM EST by telephone and verified that I am speaking with the correct person using two identifiers.  Location: Patient: Home Provider: Office   I discussed the limitations, risks, security and privacy concerns of performing an evaluation and management service by telephone and the availability of in person appointments. I also discussed with the patient that there may be a patient responsible charge related to this service. The patient expressed understanding and agreed to proceed.  Dysuria  This is a new problem. The problem occurs every urination. The problem has been unchanged. The quality of the pain is described as burning. There has been no fever. Associated symptoms include frequency and hesitancy.   Denies n/v, abdominal pain, back pain, fevers. Reports he vomited once yesterday and felt poorly. Had chills.     No Known Allergies   Current Outpatient Medications:  .  aspirin 325 MG tablet, Take 325 mg by mouth daily., Disp: , Rfl:  .  glucose blood test strip, Test fasting sugar before breakfast daily., Disp: 100 each, Rfl: 4 .  hydrochlorothiazide (HYDRODIURIL) 12.5 MG tablet, TAKE 1 TABLET BY MOUTH EVERY DAY, Disp: 30 tablet, Rfl: 0 .  lisinopril (ZESTRIL) 10 MG tablet, TAKE 1 BY MOUTH EVERY DAY, Disp: 30 tablet, Rfl: 0 .  lisinopril-hydrochlorothiazide (ZESTORETIC) 10-12.5 MG tablet, TAKE 1 TABLET BY MOUTH DAILY, Disp: 90 tablet, Rfl: 0 .  metFORMIN (GLUCOPHAGE) 500 MG tablet, Take 1 tablet (500 mg total) by mouth daily with breakfast., Disp: 90 tablet, Rfl: 3  Review of Systems  Genitourinary: Positive for dysuria, frequency and hesitancy.    Social  History   Tobacco Use  . Smoking status: Never Smoker  . Smokeless tobacco: Current User    Types: Chew  . Tobacco comment: has been using for 8 years  Substance Use Topics  . Alcohol use: No    Alcohol/week: 0.0 standard drinks      Objective:   There were no vitals taken for this visit. There were no vitals filed for this visit.There is no height or weight on file to calculate BMI.   Physical Exam   No results found for any visits on 10/02/19.     Assessment & Plan    1. Dysuria  - POCT urinalysis dipstick - CULTURE, URINE COMPREHENSIVE  2. Pyelonephritis  - sulfamethoxazole-trimethoprim (BACTRIM DS) 800-160 MG tablet; Take 1 tablet by mouth 2 (two) times daily for 7 days.  Dispense: 14 tablet; Refill: 0   I discussed the assessment and treatment plan with the patient. The patient was provided an opportunity to ask questions and all were answered. The patient agreed with the plan and demonstrated an understanding of the instructions.   The patient was advised to call back or seek an in-person evaluation if the symptoms worsen or if the condition fails to improve as anticipated.  I provided 25 minutes of non-face-to-face time during this encounter.  The entirety of the information documented in the History of Present Illness, Review of Systems and Physical Exam were personally obtained by me. Portions of this information were initially documented by [facility] and reviewed  by me for thoroughness and accuracy.      Trinna Post, PA-C  [facility]

## 2019-10-03 NOTE — Patient Instructions (Signed)

## 2019-10-05 LAB — CULTURE, URINE COMPREHENSIVE

## 2019-10-18 ENCOUNTER — Other Ambulatory Visit: Payer: Self-pay | Admitting: Family Medicine

## 2019-10-18 DIAGNOSIS — E1169 Type 2 diabetes mellitus with other specified complication: Secondary | ICD-10-CM

## 2019-10-31 ENCOUNTER — Ambulatory Visit: Payer: Self-pay | Admitting: Family Medicine

## 2019-11-01 ENCOUNTER — Ambulatory Visit (INDEPENDENT_AMBULATORY_CARE_PROVIDER_SITE_OTHER): Payer: Self-pay | Admitting: Family Medicine

## 2019-11-01 ENCOUNTER — Other Ambulatory Visit: Payer: Self-pay

## 2019-11-01 ENCOUNTER — Encounter: Payer: Self-pay | Admitting: Family Medicine

## 2019-11-01 VITALS — BP 116/80 | HR 89 | Temp 97.3°F | Resp 16 | Ht 76.0 in | Wt 270.0 lb

## 2019-11-01 DIAGNOSIS — E1165 Type 2 diabetes mellitus with hyperglycemia: Secondary | ICD-10-CM

## 2019-11-01 DIAGNOSIS — I10 Essential (primary) hypertension: Secondary | ICD-10-CM

## 2019-11-01 DIAGNOSIS — E669 Obesity, unspecified: Secondary | ICD-10-CM

## 2019-11-01 LAB — POCT GLYCOSYLATED HEMOGLOBIN (HGB A1C)
Est. average glucose Bld gHb Est-mCnc: 186
Hemoglobin A1C: 8.1 % — AB (ref 4.0–5.6)

## 2019-11-01 MED ORDER — LISINOPRIL-HYDROCHLOROTHIAZIDE 10-12.5 MG PO TABS: 1.0000 | ORAL_TABLET | Freq: Every day | ORAL | 3 refills | Status: DC

## 2019-11-01 MED ORDER — METFORMIN HCL 500 MG PO TABS: ORAL_TABLET | ORAL | 3 refills | Status: DC

## 2019-11-01 NOTE — Progress Notes (Signed)
Patient: Charles [name]. Male    DOB: Sep 21, 1965   54 y.o.   MRN: [id number] Visit Date: 11/01/2019  Today's Provider: Dortha Kern, PA   Chief Complaint  Patient presents with  . Follow-up  . Diabetes  . Hypertension   Subjective:     HPI   Uncontrolled type 2 diabetes mellitus with hyperglycemia (HCC) From 08/02/2019-Hgb A1C 9.7.  Home blood sugars averaging 110-145.  Essential (primary) hypertension From 08/02/2019-Very good BP control on the Zestoretic 10-12.5 mg qd. Advised to continue to work on weight loss, regular exercise, restrict salt intake and follow a diabetic diet. Labs ordered but zero were done.  Home blood pressure averaging 140/80.  Inguinal strain, left, initial encounter From 08/02/2019-advised to apply moist heat or may use Aleve prn. Recheck prn.  Past Medical History:  Diagnosis Date  . Hypertension    Past Surgical History:  Procedure Laterality Date  . DISTAL BICEPS TENDON REPAIR Right 03/31/2015   Procedure: DISTAL BICEPS TENDON REPAIR;  Surgeon: Christena Flake, MD;  Location: [facility] ORS;  Service: Orthopedics;  Laterality: Right;  . ELBOW SURGERY Right 1975  . LEG SURGERY Right 1971   Family History  Problem Relation Age of Onset  . Breast cancer Mother   . Cancer Father   . Diabetes Father    No Known Allergies  Current Outpatient Medications:  .  aspirin 325 MG tablet, Take 325 mg by mouth daily., Disp: , Rfl:  .  glucose blood test strip, Test fasting sugar before breakfast daily., Disp: 100 each, Rfl: 4 .  lisinopril-hydrochlorothiazide (ZESTORETIC) 10-12.5 MG tablet, TAKE 1 TABLET BY MOUTH DAILY, Disp: 90 tablet, Rfl: 0 .  metFORMIN (GLUCOPHAGE) 500 MG tablet, TAKE 1 TABLET(500 MG) BY MOUTH DAILY WITH BREAKFAST, Disp: 30 tablet, Rfl: 0 .  hydrochlorothiazide (HYDRODIURIL) 12.5 MG tablet, TAKE 1 TABLET BY MOUTH EVERY DAY (Patient not taking: Reported on 11/01/2019), Disp: 30 tablet, Rfl: 0 .  lisinopril (ZESTRIL) 10 MG  tablet, TAKE 1 BY MOUTH EVERY DAY (Patient not taking: Reported on 11/01/2019), Disp: 30 tablet, Rfl: 0  Review of Systems  Constitutional: Negative for appetite change, chills and fever.  Respiratory: Negative for chest tightness, shortness of breath and wheezing.   Cardiovascular: Negative for chest pain and palpitations.  Gastrointestinal: Negative for abdominal pain, nausea and vomiting.    Social History   Tobacco Use  . Smoking status: Never Smoker  . Smokeless tobacco: Current User    Types: Chew  . Tobacco comment: has been using for 8 years  Substance Use Topics  . Alcohol use: No    Alcohol/week: 0.0 standard drinks     Objective:   BP 116/80 (BP Location: Right Arm, Patient Position: Sitting, Cuff Size: Large)   Pulse 89   Temp (!) 97.3 F (36.3 C) (Other (Comment))   Resp 16   Ht 6\' 4"  (1.93 m)   Wt 270 lb (122.5 kg)   SpO2 97%   BMI 32.87 kg/m  Vitals:   11/01/19 1516  BP: 116/80  Pulse: 89  Resp: 16  Temp: (!) 97.3 F (36.3 C)  TempSrc: Other (Comment)  SpO2: 97%  Weight: 270 lb (122.5 kg)  Height: 6\' 4"  (1.93 m)  Body mass index is 32.87 kg/m. Wt Readings from Last 3 Encounters:  11/01/19 270 lb (122.5 kg)  08/02/19 280 lb (127 kg)  12/03/18 276 lb (125.2 kg)    Physical Exam Constitutional:  General: He is not in acute distress.    Appearance: He is well-developed.  HENT:     Head: Normocephalic and atraumatic.     Right Ear: Hearing normal.     Left Ear: Hearing normal.     Nose: Nose normal.  Eyes:     General: Lids are normal. No scleral icterus.       Right eye: No discharge.        Left eye: No discharge.     Conjunctiva/sclera: Conjunctivae normal.  Cardiovascular:     Rate and Rhythm: Normal rate.     Heart sounds: Normal heart sounds.  Pulmonary:     Effort: Pulmonary effort is normal. No respiratory distress.  Musculoskeletal:        General: Normal range of motion.     Cervical back: Neck supple.  Skin:     Findings: No lesion or rash.  Neurological:     Mental Status: He is alert and oriented to person, place, and time.  Psychiatric:        Speech: Speech normal.        Behavior: Behavior normal.        Thought Content: Thought content normal.       Assessment & Plan    1. Uncontrolled type 2 diabetes mellitus with hyperglycemia (HCC) Feeling well on the Metformin 500 mg qd without hypoglycemic episodes. No polyuria or blurring of vision. Encouraged to see ophthalmologist annually. Hgb A1C down to 8/1% today (was 9.7% 3 months ago). Continue diabetic diet and recheck follow up labs. - POCT glycosylated hemoglobin (Hb A1C) - CBC with Differential/Platelet - Comprehensive metabolic panel - metFORMIN (GLUCOPHAGE) 500 MG tablet; TAKE 1 TABLET(500 MG) BY MOUTH DAILY WITH BREAKFAST  Dispense: 90 tablet; Refill: 3  2. Essential hypertension Good BP control and tolerating Zestorectic 10-12.5 mg qd without side effects. Refilled prescription and will get follow up labs today. - CBC with Differential/Platelet - Comprehensive metabolic panel - lisinopril-hydrochlorothiazide (ZESTORETIC) 10-12.5 MG tablet; Take 1 tablet by mouth daily.  Dispense: 90 tablet; Refill: 3  3. Obesity, Class I, BMI 30-34.9 BMI >32 but has lost 10 lbs since November 2020. Continues to exercise regularly and lowered caloric intake. Recheck labs and follow up pending reports. - POCT glycosylated hemoglobin (Hb A1C) - CBC with Differential/Platelet - Comprehensive metabolic panel     Vernie Murders, PA  [facility]

## 2019-11-16 ENCOUNTER — Other Ambulatory Visit: Payer: Self-pay | Admitting: Physician Assistant

## 2019-11-16 DIAGNOSIS — N12 Tubulo-interstitial nephritis, not specified as acute or chronic: Secondary | ICD-10-CM

## 2019-11-18 ENCOUNTER — Ambulatory Visit (INDEPENDENT_AMBULATORY_CARE_PROVIDER_SITE_OTHER): Payer: Self-pay | Admitting: Family Medicine

## 2019-11-18 ENCOUNTER — Other Ambulatory Visit: Payer: Self-pay

## 2019-11-18 ENCOUNTER — Other Ambulatory Visit: Payer: Self-pay | Admitting: Family Medicine

## 2019-11-18 ENCOUNTER — Encounter: Payer: Self-pay | Admitting: Family Medicine

## 2019-11-18 ENCOUNTER — Ambulatory Visit: Payer: Self-pay | Admitting: Family Medicine

## 2019-11-18 VITALS — BP 128/80 | HR 103 | Temp 98.2°F | Wt 267.0 lb

## 2019-11-18 DIAGNOSIS — R350 Frequency of micturition: Secondary | ICD-10-CM

## 2019-11-18 DIAGNOSIS — N3001 Acute cystitis with hematuria: Secondary | ICD-10-CM

## 2019-11-18 LAB — POCT URINALYSIS DIPSTICK
Bilirubin, UA: NEGATIVE
Blood, UA: NEGATIVE
Glucose, UA: NEGATIVE
Ketones, UA: NEGATIVE
Nitrite, UA: NEGATIVE
Protein, UA: NEGATIVE
Spec Grav, UA: >=1.03 — AB (ref 1.010–1.025)
Urobilinogen, UA: 0.2 E.U./dL
pH, UA: 6 (ref 5.0–8.0)

## 2019-11-18 MED ORDER — SULFAMETHOXAZOLE-TRIMETHOPRIM 800-160 MG PO TABS: 1.0000 | ORAL_TABLET | Freq: Two times a day (BID) | ORAL | 0 refills | Status: DC

## 2019-11-18 NOTE — Telephone Encounter (Signed)
Pt seen Adrianna for a UTI while Maurine Minister was out on leave and the Pt denied another refill but now asked if he can get a refill for the antibiotic due to  having UTI symptoms  sulfamethoxazole-trimethoprim (BACTRIM DS) 800-160 MG tablet  /please advise and call Mrs. Wilczak asap

## 2019-11-18 NOTE — Telephone Encounter (Signed)
Appt has been made for patient  

## 2019-11-18 NOTE — Progress Notes (Signed)
Charles Och [name]  MRN: [id number] DOB: October 15, 1965  Subjective:  HPI   The patient is a 54 year old male who presents with complaint of UTI symptoms.  He states he was seen on 10/02/19 and was treated with Bactrim DS for a urine culture that was positive for E. Coli.   He was then seen for an unrelated visit on 11/01/19 and there was no mention of any symptoms suggestive that the infection was not cleared. Patient states that the symptoms of his UTI had all cleared and then 4 days ago he began having symptoms of pelvic pressure, decreased stream and slight blood in the urine.  Patient Active Problem List   Diagnosis Date Noted  . Diabetes type 2, uncontrolled (HCC) 06/25/2018  . Hypertension 04/03/2015  . Essential (primary) hypertension 04/03/2015  . Biceps tendon rupture 03/30/2015  . Achrochordon 02/04/2015  . Bacterial skin infection of leg 02/04/2015  . Edema of foot 02/04/2015  . Blood pressure elevated 02/04/2015  . Obesity, Class I, BMI 30-34.9 02/04/2015  . Enteritis presumed infectious 03/21/2006    Past Medical History:  Diagnosis Date  . Hypertension    Past Surgical History:  Procedure Laterality Date  . DISTAL BICEPS TENDON REPAIR Right 03/31/2015   Procedure: DISTAL BICEPS TENDON REPAIR;  Surgeon: Christena Flake, MD;  Location: [facility] ORS;  Service: Orthopedics;  Laterality: Right;  . ELBOW SURGERY Right 1975  . LEG SURGERY Right 1971   Family History  Problem Relation Age of Onset  . Breast cancer Mother   . Cancer Father   . Diabetes Father     Social History   Socioeconomic History  . Marital status: Married    Spouse name: Not on file  . Number of children: Not on file  . Years of education: Not on file  . Highest education level: Not on file  Occupational History  . Not on file  Tobacco Use  . Smoking status: Never Smoker  . Smokeless tobacco: Current User    Types: Chew  . Tobacco comment: has been using for 8 years  Substance and Sexual  Activity  . Alcohol use: No    Alcohol/week: 0.0 standard drinks  . Drug use: No  . Sexual activity: Not on file  Other Topics Concern  . Not on file  Social History Narrative  . Not on file   Social Determinants of Health   Financial Resource Strain:   . Difficulty of Paying Living Expenses:   Food Insecurity:   . Worried About Programme researcher, broadcasting/film/video in the Last Year:   . Barista in the Last Year:   Transportation Needs:   . Freight forwarder (Medical):   Marland Kitchen Lack of Transportation (Non-Medical):   Physical Activity:   . Days of Exercise per Week:   . Minutes of Exercise per Session:   Stress:   . Feeling of Stress :   Social Connections:   . Frequency of Communication with Friends and Family:   . Frequency of Social Gatherings with Friends and Family:   . Attends Religious Services:   . Active Member of Clubs or Organizations:   . Attends Banker Meetings:   Marland Kitchen Marital Status:   Intimate Partner Violence:   . Fear of Current or Ex-Partner:   . Emotionally Abused:   Marland Kitchen Physically Abused:   . Sexually Abused:     Outpatient Encounter Medications as of 11/18/2019  Medication Sig  . aspirin  325 MG tablet Take 325 mg by mouth daily.  Marland Kitchen glucose blood test strip Test fasting sugar before breakfast daily.  Marland Kitchen lisinopril-hydrochlorothiazide (ZESTORETIC) 10-12.5 MG tablet Take 1 tablet by mouth daily.  . metFORMIN (GLUCOPHAGE) 500 MG tablet TAKE 1 TABLET(500 MG) BY MOUTH DAILY WITH BREAKFAST   No facility-administered encounter medications on file as of 11/18/2019.    No Known Allergies  Review of Systems  Constitutional: Negative for chills, diaphoresis, fever and malaise/fatigue.  HENT: Negative for congestion, sinus pain and sore throat.   Respiratory: Negative for cough, shortness of breath and wheezing.   Cardiovascular: Negative for chest pain and palpitations.  Gastrointestinal: Negative for abdominal pain and diarrhea.  Genitourinary: Positive  for frequency (with decreased stream). Negative for dysuria.  Musculoskeletal: Negative for myalgias.  Neurological: Negative for headaches.    Objective:  BP 128/80 (BP Location: Right Arm, Patient Position: Sitting, Cuff Size: Large)   Pulse (!) 103   Temp 98.2 F (36.8 C) (Skin)   Wt 267 lb (121.1 kg)   SpO2 98%   BMI 32.50 kg/m   Physical Exam  Constitutional: He is oriented to person, place, and time and well-developed, well-nourished, and in no distress.  HENT:  Head: Normocephalic.  Eyes: Conjunctivae are normal.  Pulmonary/Chest: Effort normal.  Abdominal: Soft.  Musculoskeletal:        General: Normal range of motion.     Cervical back: Neck supple.  Neurological: He is alert and oriented to person, place, and time.  Skin: No rash noted.  Psychiatric: Mood, affect and judgment normal.    Assessment and Plan :  1. Urinary frequency Increase of frequency with some oliguria over the past 3-4 days. No fever but feeling bladder pressure. No back pain or CVA tenderness to percussion. Increase fluid intake and restart antibiotic while awaiting culture report. - POCT urinalysis dipstick - Urine Culture - sulfamethoxazole-trimethoprim (BACTRIM DS) 800-160 MG tablet; Take 1 tablet by mouth 2 (two) times daily.  Dispense: 28 tablet; Refill: 0  2. Acute cystitis with hematuria Had UTI on 10-02-19 with E.coli on culture. Some bladder pressure and slight drop of blood at end of urination the past 4 days. No urinary retention. Will refill Bactrim-DS and recheck culture. - Urine Culture - sulfamethoxazole-trimethoprim (BACTRIM DS) 800-160 MG tablet; Take 1 tablet by mouth 2 (two) times daily.  Dispense: 28 tablet; Refill: 0

## 2019-11-21 LAB — URINE CULTURE

## 2019-12-02 ENCOUNTER — Other Ambulatory Visit: Payer: Self-pay | Admitting: Family Medicine

## 2019-12-02 DIAGNOSIS — N3001 Acute cystitis with hematuria: Secondary | ICD-10-CM

## 2019-12-02 DIAGNOSIS — R350 Frequency of micturition: Secondary | ICD-10-CM

## 2019-12-02 NOTE — Telephone Encounter (Signed)
Requested medications are due for refill today?  Medication Not assigned to a protocol.    Requested medications are on active medication list?  Yes  Last Refill:   11/18/2019  # 28 with no refills   Future visit scheduled?  No  Notes to Clinic:

## 2019-12-03 NOTE — Telephone Encounter (Signed)
He was in recently with symptoms

## 2019-12-04 NOTE — Progress Notes (Signed)
Patient: Charles [name]. Male    DOB: [birth year]   54 y.o.   MRN: [id number] Visit Date: 12/05/2019  Today's Provider: Dortha Kern, PA   Chief Complaint  Patient presents with  . Urinary Frequency   Subjective:     Urinary Frequency  This is a new problem. The current episode started 1 to 4 weeks ago. The patient is experiencing no pain. There has been no fever. Associated symptoms include urgency. Pertinent negatives include no chills, flank pain, frequency or hesitancy. He has tried antibiotics for the symptoms. The treatment provided mild relief.      1. Urinary frequency from 11/18/19 Increase of frequency with some oliguria over the past 3-4 days. No fever but feeling bladder pressure. No back pain or CVA tenderness to percussion. Increase fluid intake and restart antibiotic while awaiting culture report. - POCT urinalysis dipstick - Urine Culture - sulfamethoxazole-trimethoprim (BACTRIM DS) 800-160 MG tablet; Take 1 tablet by mouth 2 (two) times daily.  Dispense: 28 tablet; Refill: 0  2. Acute cystitis with hematuria Had UTI on 10-02-19 with E.coli on culture. Some bladder pressure and slight drop of blood at end of urination the past 4 days. No urinary retention. Will refill Bactrim-DS and recheck culture. - Urine Culture - sulfamethoxazole-trimethoprim (BACTRIM DS) 800-160 MG tablet; Take 1 tablet by mouth 2 (two) times daily.  Dispense: 28 tablet; Refill: 0  Past Medical History:  Diagnosis Date  . Hypertension    Past Surgical History:  Procedure Laterality Date  . DISTAL BICEPS TENDON REPAIR Right 03/31/2015   Procedure: DISTAL BICEPS TENDON REPAIR;  Surgeon: Christena Flake, MD;  Location: [facility] ORS;  Service: Orthopedics;  Laterality: Right;  . ELBOW SURGERY Right 1975  . LEG SURGERY Right 1971   Patient Active Problem List   Diagnosis Date Noted  . Diabetes type 2, uncontrolled (HCC) 06/25/2018  . Hypertension 04/03/2015  . Essential (primary)  hypertension 04/03/2015  . Biceps tendon rupture 03/30/2015  . Achrochordon 02/04/2015  . Bacterial skin infection of leg 02/04/2015  . Edema of foot 02/04/2015  . Blood pressure elevated 02/04/2015  . Obesity, Class I, BMI 30-34.9 02/04/2015  . Enteritis presumed infectious 03/21/2006   Family History  Problem Relation Age of Onset  . Breast cancer Mother   . Cancer Father   . Diabetes Father    No Known Allergies  Current Outpatient Medications:  .  aspirin 325 MG tablet, Take 325 mg by mouth daily., Disp: , Rfl:  .  glucose blood test strip, Test fasting sugar before breakfast daily., Disp: 100 each, Rfl: 4 .  lisinopril-hydrochlorothiazide (ZESTORETIC) 10-12.5 MG tablet, Take 1 tablet by mouth daily., Disp: 90 tablet, Rfl: 3 .  metFORMIN (GLUCOPHAGE) 500 MG tablet, TAKE 1 TABLET(500 MG) BY MOUTH DAILY WITH BREAKFAST, Disp: 90 tablet, Rfl: 3 .  sulfamethoxazole-trimethoprim (BACTRIM DS) 800-160 MG tablet, Take 1 tablet by mouth 2 (two) times daily., Disp: 28 tablet, Rfl: 0  Review of Systems  Constitutional: Negative for chills.  Genitourinary: Positive for urgency. Negative for flank pain, frequency and hesitancy.    Social History   Tobacco Use  . Smoking status: Never Smoker  . Smokeless tobacco: Current User    Types: Chew  . Tobacco comment: has been using for 8 years  Substance Use Topics  . Alcohol use: No    Alcohol/week: 0.0 standard drinks      Objective:   BP (!) 143/94 (BP Location: Left Arm, Patient  Position: Sitting, Cuff Size: Large)   Pulse 84   Temp (!) 97.3 F (36.3 C) (Temporal)   Wt 275 lb 9.6 oz (125 kg)   BMI 33.55 kg/m  Vitals:   12/05/19 1507  BP: (!) 143/94  Pulse: 84  Temp: (!) 97.3 F (36.3 C)  TempSrc: Temporal  Weight: 275 lb 9.6 oz (125 kg)  Body mass index is 33.55 kg/m.  Physical Exam Constitutional:      General: He is not in acute distress.    Appearance: He is well-developed.  HENT:     Head: Normocephalic and  atraumatic.     Right Ear: Hearing normal.     Left Ear: Hearing normal.     Nose: Nose normal.  Eyes:     General: Lids are normal. No scleral icterus.       Right eye: No discharge.        Left eye: No discharge.     Conjunctiva/sclera: Conjunctivae normal.  Cardiovascular:     Rate and Rhythm: Normal rate and regular rhythm.     Heart sounds: Normal heart sounds.  Pulmonary:     Effort: Pulmonary effort is normal. No respiratory distress.     Breath sounds: Normal breath sounds.  Abdominal:     General: Bowel sounds are normal.     Palpations: Abdomen is soft.     Tenderness: There is no right CVA tenderness or left CVA tenderness.  Genitourinary:    Comments: Prostate enlarged without tenderness of palpable masses. Musculoskeletal:        General: Normal range of motion.     Cervical back: Normal range of motion and neck supple.  Skin:    Findings: No lesion or rash.  Neurological:     Mental Status: He is alert and oriented to person, place, and time.  Psychiatric:        Speech: Speech normal.        Behavior: Behavior normal.        Thought Content: Thought content normal.       Assessment & Plan    1. Urinary frequency Improved frequency but not completely gone. Urine culture on 11-19-19 isolated E. Coli sensitive to all antibiotics. No fever and no further gross blood in urine. Urinalysis showed a few RBC's and WBC's per hpf. No pain or dysuria. Will continue Bactrim DS for prostatitis. - POCT urinalysis dipstick - sulfamethoxazole-trimethoprim (BACTRIM DS) 800-160 MG tablet; Take 1 tablet by mouth 2 (two) times daily.  Dispense: 28 tablet; Refill: 0  2. Prostatitis, unspecified prostatitis type DRE showed some enlargement of prostate. No crystals on urine microscopy. Will repeat culture for possible antibiotic resistance forming. If no better with 2 more weeks of Bactrim DS, will need referral to an urologist. Continue increase in water intake to flush out urinary  tract. No fever, pelvic pain or prostatodynia. - sulfamethoxazole-trimethoprim (BACTRIM DS) 800-160 MG tablet; Take 1 tablet by mouth 2 (two) times daily.  Dispense: 28 tablet; Refill: 0 - Urine Culture     Vernie Murders, PA  [facility]

## 2019-12-05 ENCOUNTER — Ambulatory Visit (INDEPENDENT_AMBULATORY_CARE_PROVIDER_SITE_OTHER): Payer: Self-pay | Admitting: Family Medicine

## 2019-12-05 ENCOUNTER — Encounter: Payer: Self-pay | Admitting: Family Medicine

## 2019-12-05 ENCOUNTER — Ambulatory Visit: Payer: Self-pay | Admitting: Family Medicine

## 2019-12-05 ENCOUNTER — Other Ambulatory Visit: Payer: Self-pay

## 2019-12-05 VITALS — BP 143/94 | HR 84 | Temp 97.3°F | Wt 275.6 lb

## 2019-12-05 DIAGNOSIS — N419 Inflammatory disease of prostate, unspecified: Secondary | ICD-10-CM

## 2019-12-05 DIAGNOSIS — R350 Frequency of micturition: Secondary | ICD-10-CM

## 2019-12-05 LAB — POCT URINALYSIS DIPSTICK
Bilirubin, UA: NEGATIVE
Glucose, UA: NEGATIVE
Ketones, UA: NEGATIVE
Nitrite, UA: NEGATIVE
Protein, UA: NEGATIVE
Spec Grav, UA: 1.025 (ref 1.010–1.025)
Urobilinogen, UA: NEGATIVE E.U./dL — AB
pH, UA: 6 (ref 5.0–8.0)

## 2019-12-05 MED ORDER — SULFAMETHOXAZOLE-TRIMETHOPRIM 800-160 MG PO TABS: 1.0000 | ORAL_TABLET | Freq: Two times a day (BID) | ORAL | 0 refills | Status: DC

## 2019-12-07 LAB — URINE CULTURE

## 2020-03-15 ENCOUNTER — Encounter: Payer: Self-pay | Admitting: Emergency Medicine

## 2020-03-15 ENCOUNTER — Other Ambulatory Visit: Payer: Self-pay

## 2020-03-15 ENCOUNTER — Emergency Department
Admission: EM | Admit: 2020-03-15 | Discharge: 2020-03-15 | Disposition: A | Discharge status: Home / Self Care | Payer: Self-pay | Attending: Emergency Medicine | Admitting: Emergency Medicine

## 2020-03-15 DIAGNOSIS — E119 Type 2 diabetes mellitus without complications: Secondary | ICD-10-CM | POA: Insufficient documentation

## 2020-03-15 DIAGNOSIS — Z7984 Long term (current) use of oral hypoglycemic drugs: Secondary | ICD-10-CM | POA: Insufficient documentation

## 2020-03-15 DIAGNOSIS — N390 Urinary tract infection, site not specified: Secondary | ICD-10-CM | POA: Insufficient documentation

## 2020-03-15 DIAGNOSIS — Z79899 Other long term (current) drug therapy: Secondary | ICD-10-CM | POA: Insufficient documentation

## 2020-03-15 DIAGNOSIS — I10 Essential (primary) hypertension: Secondary | ICD-10-CM | POA: Insufficient documentation

## 2020-03-15 DIAGNOSIS — Z7982 Long term (current) use of aspirin: Secondary | ICD-10-CM | POA: Insufficient documentation

## 2020-03-15 LAB — COMPREHENSIVE METABOLIC PANEL
ALT: 31 U/L (ref 0–44)
AST: 18 U/L (ref 15–41)
Albumin: 3.8 g/dL (ref 3.5–5.0)
Alkaline Phosphatase: 70 U/L (ref 38–126)
Anion gap: 10 (ref 5–15)
BUN: 10 mg/dL (ref 6–20)
CO2: 26 mmol/L (ref 22–32)
Calcium: 8.9 mg/dL (ref 8.9–10.3)
Chloride: 102 mmol/L (ref 98–111)
Creatinine, Ser: 0.92 mg/dL (ref 0.61–1.24)
GFR calc Af Amer: >60 mL/min (ref >60)
GFR calc non Af Amer: >60 mL/min (ref >60)
Glucose, Bld: 222 mg/dL — ABNORMAL HIGH (ref 70–99)
Potassium: 3.3 mmol/L — ABNORMAL LOW (ref 3.5–5.1)
Sodium: 138 mmol/L (ref 135–145)
Total Bilirubin: 0.7 mg/dL (ref 0.3–1.2)
Total Protein: 8.4 g/dL — ABNORMAL HIGH (ref 6.5–8.1)

## 2020-03-15 LAB — CBC
HCT: 45.8 % (ref 39.0–52.0)
Hemoglobin: 15.8 g/dL (ref 13.0–17.0)
MCH: 27 pg (ref 26.0–34.0)
MCHC: 34.5 g/dL (ref 30.0–36.0)
MCV: 78.2 fL — ABNORMAL LOW (ref 80.0–100.0)
Platelets: 318 10*3/uL (ref 150–400)
RBC: 5.86 MIL/uL — ABNORMAL HIGH (ref 4.22–5.81)
RDW: 13.9 % (ref 11.5–15.5)
WBC: 10.2 10*3/uL (ref 4.0–10.5)
nRBC: 0 % (ref 0.0–0.2)

## 2020-03-15 LAB — URINALYSIS, COMPLETE (UACMP) WITH MICROSCOPIC
Bilirubin Urine: NEGATIVE
Glucose, UA: >=500 mg/dL — AB
Hgb urine dipstick: NEGATIVE
Ketones, ur: NEGATIVE mg/dL
Nitrite: NEGATIVE
Protein, ur: NEGATIVE mg/dL
Specific Gravity, Urine: 1.011 (ref 1.005–1.030)
Squamous Epithelial / HPF: NONE SEEN (ref 0–5)
pH: 6 (ref 5.0–8.0)

## 2020-03-15 LAB — LIPASE, BLOOD: Lipase: 25 U/L (ref 11–51)

## 2020-03-15 MED ORDER — SODIUM CHLORIDE 0.9 % IV BOLUS
1000.0000 mL | Freq: Once | INTRAVENOUS | Status: AC
  Administered 2020-03-15: 1000 mL via INTRAVENOUS

## 2020-03-15 MED ORDER — CEPHALEXIN 500 MG PO CAPS
500.0000 mg | ORAL_CAPSULE | Freq: Three times a day (TID) | ORAL | 0 refills | Status: DC
Start: 2020-03-15 — End: 2020-05-19

## 2020-03-15 MED ORDER — METFORMIN HCL 500 MG PO TABS: 500.0000 mg | ORAL_TABLET | Freq: Two times a day (BID) | ORAL | 1 refills | Status: DC

## 2020-03-15 MED ORDER — SODIUM CHLORIDE 0.9% FLUSH
3.0000 mL | Freq: Once | INTRAVENOUS | Status: AC
  Administered 2020-03-15: 3 mL via INTRAVENOUS

## 2020-03-15 MED ORDER — SODIUM CHLORIDE 0.9 % IV SOLN
1.0000 g | Freq: Once | INTRAVENOUS | Status: AC
  Administered 2020-03-15: 1 g via INTRAVENOUS
  Filled 2020-03-15: qty 10

## 2020-03-15 NOTE — ED Triage Notes (Signed)
Pt to ED via POV c/o lower abdominal pain, hematuria, and testicle pain. Pt states that he has been having symptoms since Friday. Pt is in NAD at this time.

## 2020-03-15 NOTE — ED Provider Notes (Signed)
[facility] Emergency Department Provider Note  Time seen: 5:17 PM  I have reviewed the triage vital signs and the nursing notes.   HISTORY  Chief Complaint Abdominal Pain and Hematuria   HPI Charles ACKERT [name] is a 54 y.o. male with a past medical history of hypertension, diabetes, presents to the emergency department for dysuria and fever.  According to the patient for the past 3 to 4 days he has been experiencing a slight dysuria with urinary frequency.  Patient states a history of urinary tract infection in the past to which this feels identical.  States very mild lower abdominal discomfort.  Patient states over the weekend he was experiencing chills and possible fever.  Has not experienced any chills or fever today per patient.   Past Medical History:  Diagnosis Date  . Hypertension     Patient Active Problem List   Diagnosis Date Noted  . Diabetes type 2, uncontrolled (HCC) 06/25/2018  . Hypertension 04/03/2015  . Essential (primary) hypertension 04/03/2015  . Biceps tendon rupture 03/30/2015  . Achrochordon 02/04/2015  . Bacterial skin infection of leg 02/04/2015  . Edema of foot 02/04/2015  . Blood pressure elevated 02/04/2015  . Obesity, Class I, BMI 30-34.9 02/04/2015  . Enteritis presumed infectious 03/21/2006    Past Surgical History:  Procedure Laterality Date  . DISTAL BICEPS TENDON REPAIR Right 03/31/2015   Procedure: DISTAL BICEPS TENDON REPAIR;  Surgeon: Charles Flake, MD;  Location: [facility] ORS;  Service: Orthopedics;  Laterality: Right;  . ELBOW SURGERY Right 1975  . LEG SURGERY Right 1971    Prior to Admission medications   Medication Sig Start Date End Date Taking? Authorizing Provider  aspirin 325 MG tablet Take 325 mg by mouth daily.    [provider]  glucose blood test strip Test fasting sugar before breakfast daily. 06/01/18   Chrismon, Jodell Cipro, PA  lisinopril-hydrochlorothiazide (ZESTORETIC) 10-12.5 MG tablet Take  1 tablet by mouth daily. 11/01/19   Chrismon, Jodell Cipro, PA  metFORMIN (GLUCOPHAGE) 500 MG tablet TAKE 1 TABLET(500 MG) BY MOUTH DAILY WITH BREAKFAST 11/01/19   Chrismon, Jodell Cipro, PA  sulfamethoxazole-trimethoprim (BACTRIM DS) 800-160 MG tablet Take 1 tablet by mouth 2 (two) times daily. 12/05/19   Chrismon, Jodell Cipro, PA    No Known Allergies  Family History  Problem Relation Age of Onset  . Breast cancer Mother   . Cancer Father   . Diabetes Father     Social History Social History   Tobacco Use  . Smoking status: Never Smoker  . Smokeless tobacco: Current User    Types: Chew  . Tobacco comment: has been using for 8 years  Substance Use Topics  . Alcohol use: No    Alcohol/week: 0.0 standard drinks  . Drug use: No    Review of Systems Constitutional: Subjective fever over the past several days but none today. Cardiovascular: Negative for chest pain. Respiratory: Negative for shortness of breath. Gastrointestinal: Slight lower abdominal discomfort. Genitourinary: Positive for dysuria and urinary frequency Musculoskeletal: Negative for musculoskeletal complaints Neurological: Negative for headache All other ROS negative  ____________________________________________   PHYSICAL EXAM:  VITAL SIGNS: ED Triage Vitals  Enc Vitals Group     BP 03/15/20 1218 (!) 178/98     Pulse Rate 03/15/20 1218 (!) 101     Resp 03/15/20 1218 16     Temp 03/15/20 1218 98.2 F (36.8 C)     Temp Source 03/15/20 1218 Oral  SpO2 03/15/20 1218 98 %     Weight 03/15/20 1219 270 lb (122.5 kg)     Height 03/15/20 1219 6\' 4"  (1.93 m)     Head Circumference --      Peak Flow --      Pain Score 03/15/20 1219 0     Pain Loc --      Pain Edu? --      Excl. in GC? --    Constitutional: Alert and oriented. Well appearing and in no distress. Eyes: Normal exam ENT      Head: Normocephalic and atraumatic.      Mouth/Throat: Mucous membranes are moist. Cardiovascular: Normal rate, regular  rhythm Respiratory: Normal respiratory effort without tachypnea nor retractions. Breath sounds are clear Gastrointestinal: Soft and nontender. No distention.  Musculoskeletal: Nontender with normal range of motion in all extremities. Neurologic:  Normal speech and language. No gross focal neurologic deficits  Skin:  Skin is warm, dry and intact.  Psychiatric: Mood and affect are normal.  ____________________________________________   INITIAL IMPRESSION / ASSESSMENT AND PLAN / ED COURSE  Pertinent labs & imaging results that were available during my care of the patient were reviewed by me and considered in my medical decision making (see chart for details).   Patient presents to the emergency department for urinary frequency lower abdominal discomfort mild dysuria and chills.  Patient has had a urinary tract infection previously to which this feels identical per patient.  Had a PCP appointment tomorrow but due to worsening symptoms came to the emergency department today.  Patient's urinalysis appears consistent with urinary tract infection.  I reviewed the patient's past cultures which have been pansensitive E. coli.  We will start on IV Rocephin.  Reassuringly blood work is largely within normal limits besides elevated blood glucose in the 200s, urinalysis does show greater than 500 glucose.  Patient is only taking Metformin once daily.  We will increase the patient's Metformin to twice daily.  We will dose IV antibiotics and discharged on oral antibiotics and have the patient follow-up with his doctor.  Patient agreeable to plan of care.  Overall appears well.  05/16/20 [name] was evaluated in Emergency Department on 03/15/2020 for the symptoms described in the history of present illness. He was evaluated in the context of the global COVID-19 pandemic, which necessitated consideration that the patient might be at risk for infection with the SARS-CoV-2 virus that causes COVID-19. Institutional  protocols and algorithms that pertain to the evaluation of patients at risk for COVID-19 are in a state of rapid change based on information released by regulatory bodies including the CDC and federal and state organizations. These policies and algorithms were followed during the patient's care in the ED.  ____________________________________________   FINAL CLINICAL IMPRESSION(S) / ED DIAGNOSES  Urinary tract infection   05/16/2020, MD 03/15/20 1724

## 2020-03-15 NOTE — ED Notes (Signed)
Pt to front desk stating that he was getting irritable because of waiting. Pt states that he has not eaten all day. RN offered to check with EDP to see if pt could have something to eat.  Per Dr. Lenard Lance, ok to give pt something to eat  Pt given crackers and peanut butter

## 2020-03-15 NOTE — ED Notes (Signed)
Pt provided sandwich tray as requested °

## 2020-03-15 NOTE — ED Notes (Signed)
Pt visualized in NAD at this time, pt sitting in chair at this time watching TV. Repeat VS obtained by this RN.

## 2020-03-16 ENCOUNTER — Ambulatory Visit: Payer: Self-pay | Admitting: Family Medicine

## 2020-03-16 ENCOUNTER — Telehealth: Payer: Self-pay

## 2020-03-16 NOTE — Telephone Encounter (Signed)
Patient is wondering if he can have a doctors note for his visit today to take back to work tomorrow. I rescheduled the patient's office visit because he was seen at the ER yesterday for the problem he was going to be seen for today office and was given medication. I rescheduled the patient for next week just to give him time to take medication. Please advise?

## 2020-03-16 NOTE — Telephone Encounter (Signed)
Awaiting response from pcp

## 2020-03-16 NOTE — Progress Notes (Deleted)
     Established patient visit   Patient: Charles TESCH [name]   DOB: 07/28/1966   54 y.o. Male  MRN: [id number] Visit Date: 03/16/2020  Today's healthcare provider: Dortha Kern, PA   No chief complaint on file.  Subjective    Urinary Frequency  Associated symptoms include frequency.    ***  {Show patient history (optional):23778::" "}   Medications: Outpatient Medications Prior to Visit  Medication Sig  . aspirin 325 MG tablet Take 325 mg by mouth daily.  . cephALEXin (KEFLEX) 500 MG capsule Take 1 capsule (500 mg total) by mouth 3 (three) times daily.  Marland Kitchen glucose blood test strip Test fasting sugar before breakfast daily.  Marland Kitchen lisinopril-hydrochlorothiazide (ZESTORETIC) 10-12.5 MG tablet Take 1 tablet by mouth daily.  . metFORMIN (GLUCOPHAGE) 500 MG tablet TAKE 1 TABLET(500 MG) BY MOUTH DAILY WITH BREAKFAST  . metFORMIN (GLUCOPHAGE) 500 MG tablet Take 1 tablet (500 mg total) by mouth 2 (two) times daily with a meal.  . sulfamethoxazole-trimethoprim (BACTRIM DS) 800-160 MG tablet Take 1 tablet by mouth 2 (two) times daily.   No facility-administered medications prior to visit.    Review of Systems  Genitourinary: Positive for frequency.    {Heme  Chem  Endocrine  Serology  Results Review (optional):23779::" "}  Objective    There were no vitals taken for this visit. {Show previous vital signs (optional):23777::" "}  Physical Exam  ***  No results found for any visits on 03/16/20.  Assessment & Plan     ***  No follow-ups on file.      {provider attestation***:1}   Dortha Kern, PA  [facility] [phone] (phone) [phone] (fax)  [facility]

## 2020-03-16 NOTE — Telephone Encounter (Signed)
Patient called to check status on the note that he requested in an earlier message. Asking for a call back if he will get the note stating that his appointment was changed from today to next week. Please advise  Ph# [phone]

## 2020-03-16 NOTE — Telephone Encounter (Signed)
Was he out of work today? He shouldn't need a note stating he changed his appointment. If he needs a not about his ER visit yesterday, he would need a note from them.

## 2020-03-18 LAB — URINE CULTURE: Culture: 50000 — AB

## 2020-03-24 ENCOUNTER — Ambulatory Visit: Payer: Self-pay | Admitting: Family Medicine

## 2020-05-12 ENCOUNTER — Ambulatory Visit: Payer: Self-pay | Admitting: Family Medicine

## 2020-05-19 ENCOUNTER — Ambulatory Visit (INDEPENDENT_AMBULATORY_CARE_PROVIDER_SITE_OTHER): Payer: Self-pay | Admitting: Family Medicine

## 2020-05-19 ENCOUNTER — Encounter: Payer: Self-pay | Admitting: Family Medicine

## 2020-05-19 ENCOUNTER — Other Ambulatory Visit: Payer: Self-pay

## 2020-05-19 VITALS — BP 139/84 | HR 77 | Temp 98.2°F | Wt 277.0 lb

## 2020-05-19 DIAGNOSIS — E669 Obesity, unspecified: Secondary | ICD-10-CM

## 2020-05-19 DIAGNOSIS — I10 Essential (primary) hypertension: Secondary | ICD-10-CM

## 2020-05-19 DIAGNOSIS — E1169 Type 2 diabetes mellitus with other specified complication: Secondary | ICD-10-CM

## 2020-05-19 NOTE — Progress Notes (Signed)
Established patient visit   Patient: Charles SHAKER [name]   DOB: 1965-12-07   54 y.o. Male  MRN: [id number] Visit Date: 05/19/2020  Today's healthcare provider: Dortha Kern, PA   No chief complaint on file.  Subjective    HPI   Patient is a 54 year old male whop resents for follow up from ER visit. He was seen in the ER back in July for UTI.  Patient states he feels well now and has no issues.  Patient brought with him his blood pressure readings from home.  He states he had to rush to get here today and also forgot to take his BP medicine this morning.  He had been having elevated glucose at the time he had the UTI.  He reports that at that time he was drinking a lot of Gatorade that was high in sugar.  He is watching the sugar intake better now.  The ER physician had written the patient to be taking his Metformin BID instead of once daily.  He states he never increased the Metformin from the time being seen in the ER.  Past Medical History:  Diagnosis Date  . Hypertension    Past Surgical History:  Procedure Laterality Date  . DISTAL BICEPS TENDON REPAIR Right 03/31/2015   Procedure: DISTAL BICEPS TENDON REPAIR;  Surgeon: Christena Flake, MD;  Location: [facility] ORS;  Service: Orthopedics;  Laterality: Right;  . ELBOW SURGERY Right 1975  . LEG SURGERY Right 1971   Social History   Tobacco Use  . Smoking status: Never Smoker  . Smokeless tobacco: Current User    Types: Chew  . Tobacco comment: has been using for 8 years  Substance Use Topics  . Alcohol use: No    Alcohol/week: 0.0 standard drinks  . Drug use: No   Family Status  Relation Name Status  . Mother  Alive  . Father  Deceased  . MGM  Deceased  . MGF  Deceased  . PGM  Deceased  . PGF  Deceased   No Known Allergies     Medications: Outpatient Medications Prior to Visit  Medication Sig  . aspirin 325 MG tablet Take 325 mg by mouth daily.  Marland Kitchen glucose blood test strip Test fasting sugar before  breakfast daily.  Marland Kitchen lisinopril-hydrochlorothiazide (ZESTORETIC) 10-12.5 MG tablet Take 1 tablet by mouth daily.  . metFORMIN (GLUCOPHAGE) 500 MG tablet TAKE 1 TABLET(500 MG) BY MOUTH DAILY WITH BREAKFAST  . metFORMIN (GLUCOPHAGE) 500 MG tablet Take 1 tablet (500 mg total) by mouth 2 (two) times daily with a meal.  . [DISCONTINUED] cephALEXin (KEFLEX) 500 MG capsule Take 1 capsule (500 mg total) by mouth 3 (three) times daily.  . [DISCONTINUED] sulfamethoxazole-trimethoprim (BACTRIM DS) 800-160 MG tablet Take 1 tablet by mouth 2 (two) times daily.   No facility-administered medications prior to visit.    Review of Systems  Constitutional: Negative for diaphoresis and fatigue.  HENT: Negative for congestion, sinus pain and sore throat.   Respiratory: Negative for cough and shortness of breath.   Cardiovascular: Negative for chest pain, palpitations and leg swelling.  Endocrine: Negative for polydipsia and polyuria.  Musculoskeletal: Negative for arthralgias and joint swelling.  Neurological: Negative for dizziness and headaches.    Last metabolic panel Lab Results  Component Value Date   GLUCOSE 222 (H) 03/15/2020   NA 138 03/15/2020   K 3.3 (L) 03/15/2020   CL 102 03/15/2020   CO2 26 03/15/2020   BUN  10 03/15/2020   CREATININE 0.92 03/15/2020   GFRNONAA >60 03/15/2020   GFRAA >60 03/15/2020   CALCIUM 8.9 03/15/2020   PROT 8.4 (H) 03/15/2020   ALBUMIN 3.8 03/15/2020   LABGLOB 2.9 05/18/2018   AGRATIO 1.4 05/18/2018   BILITOT 0.7 03/15/2020   ALKPHOS 70 03/15/2020   AST 18 03/15/2020   ALT 31 03/15/2020   ANIONGAP 10 03/15/2020   Last hemoglobin A1c Lab Results  Component Value Date   HGBA1C 8.1 (A) 11/01/2019      Objective    BP 139/84 (BP Location: Right Arm, Patient Position: Sitting, Cuff Size: Normal)   Pulse 77   Temp 98.2 F (36.8 C) (Oral)   Wt 277 lb (125.6 kg)   SpO2 100%   BMI 33.72 kg/m  BP Readings from Last 3 Encounters:  05/19/20 139/84    03/15/20 136/86  12/05/19 (!) 143/94   Wt Readings from Last 3 Encounters:  05/19/20 277 lb (125.6 kg)  03/15/20 270 lb (122.5 kg)  12/05/19 275 lb 9.6 oz (125 kg)   Physical Exam Constitutional:      General: He is not in acute distress.    Appearance: He is well-developed.  HENT:     Head: Normocephalic and atraumatic.     Right Ear: Hearing normal.     Left Ear: Hearing normal.     Nose: Nose normal.  Eyes:     General: Lids are normal. No scleral icterus.       Right eye: No discharge.        Left eye: No discharge.     Conjunctiva/sclera: Conjunctivae normal.  Cardiovascular:     Rate and Rhythm: Normal rate.     Heart sounds: Normal heart sounds.  Pulmonary:     Effort: Pulmonary effort is normal. No respiratory distress.     Breath sounds: Normal breath sounds.  Abdominal:     General: Bowel sounds are normal.     Palpations: Abdomen is soft.  Musculoskeletal:        General: Normal range of motion.  Skin:    Findings: No lesion or rash.  Neurological:     Mental Status: He is alert and oriented to person, place, and time.  Psychiatric:        Speech: Speech normal.        Behavior: Behavior normal.        Thought Content: Thought content normal.      No results found for any visits on 05/19/20.  Assessment & Plan     1. Type 2 diabetes mellitus with other specified complication, without long-term current use of insulin (HCC) Blood sugar in the 100-118 range in the past couple months. No polyuria, polydipsia or polyphagia. Tolerating Metformin 500 mg qd without side effects. No hypoglycemia. Recheck labs and continue diabetic diet. Recheck in 3 months. - CBC with Differential/Platelet - Comprehensive metabolic panel - Lipid Panel With LDL/HDL Ratio - Hemoglobin A1c  2. Essential (primary) hypertension Tolerating Zestoretic 10-12.5 mg qd. No edema, palpitations or dizziness. No side effects from medications. Recheck labs. - CBC with  Differential/Platelet - Comprehensive metabolic panel - Lipid Panel With LDL/HDL Ratio  3. Obesity, Class I, BMI 30-34.9 Weight up 7 lbs in the past 2 months. Encouraged to exercise 30-40 minutes 3-4 days a week and low fat diet. Recheck labs and follow up in 3 months. - CBC with Differential/Platelet - Comprehensive metabolic panel - Lipid Panel With LDL/HDL Ratio   No follow-ups on  file.      Haywood Pao, PA, have reviewed all documentation for this visit. The documentation on 05/19/20 for the exam, diagnosis, procedures, and orders are all accurate and complete.    Dortha Kern, PA  [facility] [phone] (phone) [phone] (fax)  [facility]

## 2020-07-23 ENCOUNTER — Other Ambulatory Visit: Payer: Self-pay

## 2020-07-23 ENCOUNTER — Ambulatory Visit (INDEPENDENT_AMBULATORY_CARE_PROVIDER_SITE_OTHER): Payer: Self-pay | Admitting: Family Medicine

## 2020-07-23 ENCOUNTER — Encounter: Payer: Self-pay | Admitting: Family Medicine

## 2020-07-23 VITALS — BP 146/80 | HR 101 | Temp 98.0°F | Resp 16 | Wt 278.0 lb

## 2020-07-23 DIAGNOSIS — M62838 Other muscle spasm: Secondary | ICD-10-CM

## 2020-07-23 MED ORDER — PREDNISONE 10 MG PO TABS: ORAL_TABLET | ORAL | 0 refills | Status: DC

## 2020-07-23 NOTE — Progress Notes (Addendum)
Established patient visit   Patient: Charles PIERRON [name]   DOB: [birth year]   54 y.o. Male  MRN: [id number] Visit Date: 07/23/2020  Today's healthcare provider: Dortha Kern, PA   Chief Complaint  Patient presents with   Neck Pain   Subjective    Neck Pain  This is a new problem. The current episode started in the past 7 days. The problem has been unchanged. The pain is associated with nothing. The quality of the pain is described as aching and shooting. The pain is moderate. The symptoms are aggravated by twisting and bending. The pain is same all the time. He has tried acetaminophen and heat for the symptoms. The treatment provided mild relief.      Past Medical History:  Diagnosis Date   Hypertension    Past Surgical History:  Procedure Laterality Date   DISTAL BICEPS TENDON REPAIR Right 03/31/2015   Procedure: DISTAL BICEPS TENDON REPAIR;  Surgeon: Christena Flake, MD;  Location: [facility] ORS;  Service: Orthopedics;  Laterality: Right;   ELBOW SURGERY Right 1975   LEG SURGERY Right 1971   Social History   Tobacco Use   Smoking status: Never Smoker   Smokeless tobacco: Current User    Types: Chew   Tobacco comment: has been using for 8 years  Substance Use Topics   Alcohol use: No    Alcohol/week: 0.0 standard drinks   Drug use: No   Family History  Problem Relation Age of Onset   Breast cancer Mother    Cancer Father    Diabetes Father    No Known Allergies     Medications: Outpatient Medications Prior to Visit  Medication Sig   aspirin 325 MG tablet Take 325 mg by mouth daily.   glucose blood test strip Test fasting sugar before breakfast daily.   lisinopril-hydrochlorothiazide (ZESTORETIC) 10-12.5 MG tablet Take 1 tablet by mouth daily.   metFORMIN (GLUCOPHAGE) 500 MG tablet TAKE 1 TABLET(500 MG) BY MOUTH DAILY WITH BREAKFAST   No facility-administered medications prior to visit.    Review of Systems  Constitutional: Negative.   Musculoskeletal:  Positive for myalgias, neck pain and neck stiffness.  Neurological: Negative.       Objective    BP (!) 146/80   Pulse (!) 101   Temp 98 F (36.7 C)   Resp 16   Wt 278 lb (126.1 kg)   BMI 33.84 kg/m    Physical Exam Constitutional:      General: He is not in acute distress.    Appearance: He is well-developed.  HENT:     Head: Normocephalic and atraumatic.     Right Ear: Hearing normal.     Left Ear: Hearing normal.     Nose: Nose normal.  Eyes:     General: Lids are normal. No scleral icterus.       Right eye: No discharge.        Left eye: No discharge.     Conjunctiva/sclera: Conjunctivae normal.  Neck:     Comments: Tenderness and spasms in right posterior cervical muscles. Cardiovascular:     Rate and Rhythm: Normal rate and regular rhythm.     Heart sounds: Normal heart sounds.  Pulmonary:     Effort: Pulmonary effort is normal. No respiratory distress.  Musculoskeletal:        General: Normal range of motion.  Skin:    Findings: No lesion or rash.  Neurological:     Mental Status:  He is alert and oriented to person, place, and time.  Psychiatric:        Speech: Speech normal.        Behavior: Behavior normal.        Thought Content: Thought content normal.       No results found for any visits on 07/23/20.  Assessment & Plan     1. Cervical paraspinal muscle spasm Developed spasm and pain in the right posterior neck muscles/trapezius over the past 2 days. Had some tingling in the right thumb and index finger from "slept in the wrong position over the weekend". Continue to use moist heat andTENS unit for muscle spasm. Not much help from Aleve and Tylenol. Will give prednisone taper for stronger anti-inflammatory effect and decrease swelling of pinched nerves. Limit lifting over head and recheck prn. - predniSONE (DELTASONE) 10 MG tablet; Taper down dosage by 1 tablets by mouth daily starting at 6 day 1, then, 5 day 2, 4 day 3, 3 day 4, 2 day 5 and 1 day  6. Divide dosage among meals and bedtime each day.  Dispense: 21 tablet; Refill: 0  No follow-ups on file.      Haywood Pao, PA, have reviewed all documentation for this visit. The documentation on 07/23/20 for the exam, diagnosis, procedures, and orders are all accurate and complete.    Dortha Kern, PA  [facility] [phone] (phone) [phone] (fax)  [facility]

## 2020-07-29 ENCOUNTER — Other Ambulatory Visit: Payer: Self-pay | Admitting: Family Medicine

## 2020-07-29 ENCOUNTER — Telehealth: Payer: Self-pay | Admitting: Family Medicine

## 2020-07-29 DIAGNOSIS — M62838 Other muscle spasm: Secondary | ICD-10-CM

## 2020-07-29 NOTE — Telephone Encounter (Signed)
Patient called and his wife answered and says she called about the Prednisone. I advised the office is closed until Monday and the provider will have to approve the refill. Advised if he needs it to go to the UC for evaluation. She verbalized understanding.

## 2020-07-29 NOTE — Telephone Encounter (Signed)
Pt still has some inflamation and wanted one more round of predniSONE (DELTASONE) 10 MG tablet / please advise if this can be sent over the holiday

## 2020-07-29 NOTE — Telephone Encounter (Signed)
Requested medication (s) are due for refill today - unknown  Requested medication (s) are on the active medication list -yes  Future visit scheduled -no  Last refill: 07/23/20  Notes to clinic: Request refill non delegated Rx  Requested Prescriptions  Pending Prescriptions Disp Refills   predniSONE (STERAPRED UNI-PAK 21 TAB) 10 MG (21) TBPK tablet [Pharmacy Med Name: PREDNISONE 10MG  TABS PACK 21S] 21 each     Sig: TAKE AS DIRECTED      Not Delegated - Endocrinology:  Oral Corticosteroids Failed - 07/29/2020  3:58 PM      Failed - This refill cannot be delegated      Failed - Last BP in normal range    BP Readings from Last 1 Encounters:  07/23/20 (!) 146/80          Passed - Valid encounter within last 6 months    Recent Outpatient Visits           6 days ago Cervical paraspinal muscle spasm   07/25/20, [place]   2 months ago Type 2 diabetes mellitus with other specified complication, without long-term current use of insulin (HCC)   [place]   7 months ago Urinary frequency   [place] [place]   8 months ago Urinary frequency   [place] [place]   9 months ago Uncontrolled type 2 diabetes mellitus with hyperglycemia (HCC)   [place] [facility]                  Requested Prescriptions  Pending Prescriptions Disp Refills   predniSONE (STERAPRED UNI-PAK 21 TAB) 10 MG (21) TBPK tablet [Pharmacy Med Name: PREDNISONE 10MG  TABS PACK 21S] 21 each     Sig: TAKE AS DIRECTED      Not Delegated - Endocrinology:  Oral Corticosteroids Failed - 07/29/2020  3:58 PM      Failed - This refill cannot be delegated      Failed - Last BP in normal range    BP Readings from Last 1 Encounters:  07/23/20 (!) 146/80          Passed - Valid encounter within last 6 months    Recent Outpatient Visits           6  days ago Cervical paraspinal muscle spasm   07/31/2020, [place] [facility], [facility]   2 months ago Type 2 diabetes mellitus with other specified complication, without long-term current use of insulin (HCC)   [place] [facility], [facility]   7 months ago Urinary frequency   [place] [facility], [facility]   8 months ago Urinary frequency   [place], [place] [facility], [facility]   9 months ago Uncontrolled type 2 diabetes mellitus with hyperglycemia [facility]   [facility] [place], [facility], [facility]

## 2020-08-03 ENCOUNTER — Telehealth: Payer: Self-pay | Admitting: Family Medicine

## 2020-08-03 ENCOUNTER — Other Ambulatory Visit: Payer: Self-pay | Admitting: Family Medicine

## 2020-08-03 DIAGNOSIS — M62838 Other muscle spasm: Secondary | ICD-10-CM

## 2020-08-03 MED ORDER — PREDNISONE 10 MG PO TABS: ORAL_TABLET | ORAL | 0 refills | Status: DC

## 2020-08-03 NOTE — Telephone Encounter (Signed)
Patient is calling to request another medication other than Predisone for Cervical paraspinal muscle spasm. Patient is still hae muscle spasm. And can even she his muscles jumping. Please advise CB- [phone] Preferred Pharmacy- [facility] & [address]

## 2020-08-03 NOTE — Telephone Encounter (Signed)
Pt called back in to follow up on request. Pt says that his preferred [facility] is out so he would like to have [facility] to the [facility] [address] instead.      Please assist.

## 2020-08-03 NOTE — Telephone Encounter (Signed)
Done

## 2020-08-03 NOTE — Telephone Encounter (Signed)
Requested medication (s) are due for refill today: no  Requested medication (s) are on the active medication list: yes  Last refill:  07/23/20 #21  Future visit scheduled: no  Notes to clinic:  Please review for refill. Refill not delegated per protocol. Also see telephone encounters on 08/03/20    Requested Prescriptions  Pending Prescriptions Disp Refills   predniSONE (STERAPRED UNI-PAK 21 TAB) 10 MG (21) TBPK tablet [Pharmacy Med Name: PREDNISONE 10MG  TABS PACK 21S] 21 each     Sig: TAKE AS DIRECTED      Not Delegated - Endocrinology:  Oral Corticosteroids Failed - 08/03/2020  4:47 PM      Failed - This refill cannot be delegated      Failed - Last BP in normal range    BP Readings from Last 1 Encounters:  07/23/20 (!) 146/80          Passed - Valid encounter within last 6 months    Recent Outpatient Visits           1 week ago Cervical paraspinal muscle spasm   07/25/20, [place], [place]   2 months ago Type 2 diabetes mellitus with other specified complication, without long-term current use of insulin (HCC)   [place], [place], [place]   8 months ago Urinary frequency   [place], [place], [place]   8 months ago Urinary frequency   [place], [place], [place]   9 months ago Uncontrolled type 2 diabetes mellitus with hyperglycemia [facility])   [facility] Chrism[place], [facility], Jodell Cipro

## 2020-08-03 NOTE — Telephone Encounter (Signed)
[facility] faxed refill request for the following medications:  predniSONE (DELTASONE) 10 MG tablet  Please advise. Thanks, [employer]

## 2020-08-04 ENCOUNTER — Other Ambulatory Visit: Payer: Self-pay | Admitting: Family Medicine

## 2020-08-04 DIAGNOSIS — M62838 Other muscle spasm: Secondary | ICD-10-CM

## 2020-08-04 MED ORDER — CYCLOBENZAPRINE HCL 10 MG PO TABS: 10.0000 mg | ORAL_TABLET | Freq: Three times a day (TID) | ORAL | 0 refills | Status: DC | PRN

## 2020-08-04 NOTE — Telephone Encounter (Signed)
Sent Flexeril to the pharmacy to use with the prednisone taper. Recheck in a week if no better.

## 2020-08-24 ENCOUNTER — Other Ambulatory Visit: Payer: Self-pay | Admitting: Family Medicine

## 2020-08-24 DIAGNOSIS — E1165 Type 2 diabetes mellitus with hyperglycemia: Secondary | ICD-10-CM

## 2020-11-05 ENCOUNTER — Other Ambulatory Visit: Payer: Self-pay | Admitting: Family Medicine

## 2020-11-05 DIAGNOSIS — I10 Essential (primary) hypertension: Secondary | ICD-10-CM

## 2020-11-05 NOTE — Telephone Encounter (Signed)
Requested medication (s) are due for refill today: expired medication  Requested medication (s) are on the active medication list: yes   Last refill:  11/01/19 #90 3 refills  Future visit scheduled: no  Notes to clinic:  expired medication. Do you want to renew Rx?     Requested Prescriptions  Pending Prescriptions Disp Refills   lisinopril-hydrochlorothiazide (ZESTORETIC) 10-12.5 MG tablet 90 tablet 3    Sig: Take 1 tablet by mouth daily.      Cardiovascular:  ACEI + Diuretic Combos Failed - 11/05/2020  5:15 PM      Failed - Na in normal range and within 180 days    Sodium  Date Value Ref Range Status  03/15/2020 138 135 - 145 mmol/L Final  05/18/2018 138 134 - 144 mmol/L Final  04/25/2014 137 136 - 145 mmol/L Final          Failed - K in normal range and within 180 days    Potassium  Date Value Ref Range Status  03/15/2020 3.3 (L) 3.5 - 5.1 mmol/L Final  04/25/2014 3.7 3.5 - 5.1 mmol/L Final          Failed - Cr in normal range and within 180 days    Creatinine  Date Value Ref Range Status  04/25/2014 1.11 0.60 - 1.30 mg/dL Final   Creatinine, Ser  Date Value Ref Range Status  03/15/2020 0.92 0.61 - 1.24 mg/dL Final          Failed - Ca in normal range and within 180 days    Calcium  Date Value Ref Range Status  03/15/2020 8.9 8.9 - 10.3 mg/dL Final   Calcium, Total  Date Value Ref Range Status  04/25/2014 8.5 8.5 - 10.1 mg/dL Final          Failed - Last BP in normal range    BP Readings from Last 1 Encounters:  07/23/20 (!) 146/80          Passed - Patient is not pregnant      Passed - Valid encounter within last 6 months    Recent Outpatient Visits           3 months ago Cervical paraspinal muscle spasm   [facility], Jodell Cipro, PA-C   5 months ago Type 2 diabetes mellitus with other specified complication, without long-term current use of insulin (HCC)   [facility], Jodell Cipro, PA-C   11 months  ago Urinary frequency   [facility] [facility], Jodell Cipro, PA-C   11 months ago Urinary frequency   [facility] [facility], Jodell Cipro, PA-C   1 year ago Uncontrolled type 2 diabetes mellitus with hyperglycemia [facility])   [facility] [place], Jodell Cipro, [place]

## 2020-11-05 NOTE — Telephone Encounter (Signed)
Medication: lisinopril-hydrochlorothiazide (ZESTORETIC) 10-12.5 MG tablet [[id number]]   Has the patient contacted their pharmacy? YES  (Agent: If no, request that the patient contact the pharmacy for the refill.) (Agent: If yes, when and what did the pharmacy advise?)  Preferred Pharmacy (with phone number or street name): [id number] Nicholes Rough, [place] - [address] AT [facility] OF [address] [place] [address] Phone: [phone] Fax: [phone] Hours: Not open 24 hours    Agent: Please be advised that RX refills may take up to 3 business days. We ask that you follow-up with your pharmacy.

## 2020-11-06 MED ORDER — LISINOPRIL-HYDROCHLOROTHIAZIDE 10-12.5 MG PO TABS: 1.0000 | ORAL_TABLET | Freq: Every day | ORAL | 0 refills | Status: DC

## 2021-01-22 ENCOUNTER — Other Ambulatory Visit: Payer: Self-pay | Admitting: Family Medicine

## 2021-01-22 DIAGNOSIS — I10 Essential (primary) hypertension: Secondary | ICD-10-CM

## 2021-01-22 NOTE — Telephone Encounter (Signed)
Requested medications are due for refill today.  yes  Requested medications are on the active medications list.  yes  Last refill. 11/06/2020  Future visit scheduled.   No  Notes to clinic.  Courtesy refill already given. Pt is more than 3 months overdue for OV.

## 2021-02-25 ENCOUNTER — Other Ambulatory Visit: Payer: Self-pay

## 2021-02-25 ENCOUNTER — Encounter: Payer: Self-pay | Admitting: Family Medicine

## 2021-02-25 ENCOUNTER — Ambulatory Visit (INDEPENDENT_AMBULATORY_CARE_PROVIDER_SITE_OTHER): Payer: Self-pay | Admitting: Family Medicine

## 2021-02-25 VITALS — BP 132/86 | HR 91 | Wt 270.0 lb

## 2021-02-25 DIAGNOSIS — E1169 Type 2 diabetes mellitus with other specified complication: Secondary | ICD-10-CM

## 2021-02-25 DIAGNOSIS — N410 Acute prostatitis: Secondary | ICD-10-CM

## 2021-02-25 DIAGNOSIS — I10 Essential (primary) hypertension: Secondary | ICD-10-CM

## 2021-02-25 DIAGNOSIS — Z1211 Encounter for screening for malignant neoplasm of colon: Secondary | ICD-10-CM

## 2021-02-25 LAB — POCT URINALYSIS DIPSTICK
Bilirubin, UA: NEGATIVE
Glucose, UA: NEGATIVE
Ketones, UA: NEGATIVE
Nitrite, UA: NEGATIVE
Protein, UA: POSITIVE — AB
Spec Grav, UA: 1.02 (ref 1.010–1.025)
Urobilinogen, UA: 0.2 E.U./dL
pH, UA: 6 (ref 5.0–8.0)

## 2021-02-25 MED ORDER — TAMSULOSIN HCL 0.4 MG PO CAPS: 0.4000 mg | ORAL_CAPSULE | Freq: Every day | ORAL | 3 refills | Status: DC

## 2021-02-25 MED ORDER — CIPROFLOXACIN HCL 500 MG PO TABS: 500.0000 mg | ORAL_TABLET | Freq: Two times a day (BID) | ORAL | 0 refills | Status: AC

## 2021-02-25 NOTE — Progress Notes (Signed)
Established patient visit   Patient: Charles ACHILLE [name]   DOB: Dec 15, 1965   55 y.o. Male  MRN: [id number] Visit Date: 02/25/2021  Today's healthcare provider: Dortha Kern, PA-C   No chief complaint on file.  Subjective    HPI  Follow up for Prostatitis  The patient was last seen for this 2 weeks ago. Changes made at last visit include rx for tamsulosin and an antibiotic .  He reports excellent compliance with treatment. He feels that condition is  resolved . He is not having side effects.   ----------------------------------------------------------------------------------------- up Past Medical History:  Diagnosis Date   Hypertension    Past Surgical History:  Procedure Laterality Date   DISTAL BICEPS TENDON REPAIR Right 03/31/2015   Procedure: DISTAL BICEPS TENDON REPAIR;  Surgeon: Christena Flake, MD;  Location: [facility] ORS;  Service: Orthopedics;  Laterality: Right;   ELBOW SURGERY Right 1975   LEG SURGERY Right 1971   Family History  Problem Relation Age of Onset   Breast cancer Mother    Cancer Father    Diabetes Father    Social History   Tobacco Use   Smoking status: Never   Smokeless tobacco: Current    Types: Chew   Tobacco comments:    has been using for 8 years  Substance Use Topics   Alcohol use: No    Alcohol/week: 0.0 standard drinks   Drug use: No   No Known Allergies     Medications: Outpatient Medications Prior to Visit  Medication Sig   aspirin 325 MG tablet Take 325 mg by mouth daily.   cyclobenzaprine (FLEXERIL) 10 MG tablet Take 1 tablet (10 mg total) by mouth 3 (three) times daily as needed for muscle spasms.   glucose blood test strip Test fasting sugar before breakfast daily.   lisinopril-hydrochlorothiazide (ZESTORETIC) 10-12.5 MG tablet TAKE 1 TABLET BY MOUTH EVERY DAY   metFORMIN (GLUCOPHAGE) 500 MG tablet TAKE 1 TABLET(500 MG) BY MOUTH DAILY WITH BREAKFAST   predniSONE (DELTASONE) 10 MG tablet Taper down dosage by 1  tablets by mouth daily starting at 6 day 1, then, 5 day 2, 4 day 3, 3 day 4, 2 day 5 and 1 day 6. Divide dosage among meals and bedtime each day.   No facility-administered medications prior to visit.    Review of Systems  Constitutional: Negative.   Gastrointestinal: Negative.   Genitourinary: Negative.   Neurological:  Negative for dizziness, light-headedness and headaches.      Objective    BP 132/86 (BP Location: Right Arm, Patient Position: Sitting, Cuff Size: Large)   Pulse 91   Wt 270 lb (122.5 kg)   SpO2 98%   BMI 32.87 kg/m     Physical Exam Constitutional:      General: He is not in acute distress.    Appearance: He is well-developed.  HENT:     Head: Normocephalic and atraumatic.     Right Ear: Hearing normal.     Left Ear: Hearing normal.     Nose: Nose normal.  Eyes:     General: Lids are normal. No scleral icterus.       Right eye: No discharge.        Left eye: No discharge.     Conjunctiva/sclera: Conjunctivae normal.  Pulmonary:     Effort: Pulmonary effort is normal. No respiratory distress.  Genitourinary:    Comments: Enlarged prostate without significant pain. No nodules appreciated. Musculoskeletal:  General: Normal range of motion.  Skin:    Findings: No lesion or rash.  Neurological:     Mental Status: He is alert and oriented to person, place, and time.  Psychiatric:        Speech: Speech normal.        Behavior: Behavior normal.        Thought Content: Thought content normal.    No results found for any visits on 02/25/21.  Assessment & Plan     1. Acute prostatitis Treated at [facility] with Cipro and Flomax for 10 days. Has had some return of decreased urine stream with urgency. Urinalysis shows leukocytes and DRE without significant pain. Refill Cipro and Flomax while urine culture incubating. May need Finasteride and/or urology referral pending reports. - POCT urinalysis dipstick - CULTURE, URINE COMPREHENSIVE -  Urinalysis, microscopic only - CBC with Differential/Platelet - Comprehensive metabolic panel - tamsulosin (FLOMAX) 0.4 MG CAPS capsule; Take 1 capsule (0.4 mg total) by mouth daily.  Dispense: 30 capsule; Refill: 3 - ciprofloxacin (CIPRO) 500 MG tablet; Take 1 tablet (500 mg total) by mouth 2 (two) times daily for 10 days.  Dispense: 20 tablet; Refill: 0  2. Essential (primary) hypertension Well controlled with Zestoretic 10-12.5 mg qd. Recheck follow up labs. - CBC with Differential/Platelet - Comprehensive metabolic panel  3. Type 2 diabetes mellitus with other specified complication, without long-term current use of insulin (HCC) FBS in the 101-110 range. Recheck labs. Continue Metformin 500 mg qd and diabetic diet. - CBC with Differential/Platelet - Comprehensive metabolic panel - Hemoglobin A1c  4. Screening for colon cancer - Ambulatory referral to Gastroenterology   No follow-ups on file.      I, Addalynn Kumari, PA-C, have reviewed all documentation for this visit. The documentation on 02/25/21 for the exam, diagnosis, procedures, and orders are all accurate and complete.    Dortha Kern, PA-C  [employer] [phone] (phone) [phone] (fax)  [facility]

## 2021-02-26 LAB — URINALYSIS, MICROSCOPIC ONLY
Bacteria, UA: NONE SEEN
Casts: NONE SEEN /lpf
Epithelial Cells (non renal): NONE SEEN /hpf (ref 0–10)
RBC, Urine: NONE SEEN /hpf (ref 0–2)

## 2021-03-03 LAB — CULTURE, URINE COMPREHENSIVE

## 2021-03-11 ENCOUNTER — Telehealth (INDEPENDENT_AMBULATORY_CARE_PROVIDER_SITE_OTHER): Payer: No Typology Code available for payment source | Admitting: Gastroenterology

## 2021-03-11 DIAGNOSIS — Z1211 Encounter for screening for malignant neoplasm of colon: Secondary | ICD-10-CM

## 2021-03-11 MED ORDER — PEG 3350-KCL-NA BICARB-NACL 420 G PO SOLR
4000.0000 mL | Freq: Once | ORAL | 0 refills | Status: AC
Start: 2021-03-11 — End: 2021-03-11

## 2021-03-11 NOTE — Progress Notes (Signed)
Gastroenterology Pre-Procedure Review  Request Date: 04/02/21 Requesting Physician: Dr. Maximino Greenland  PATIENT REVIEW QUESTIONS: The patient responded to the following health history questions as indicated:    1. Are you having any GI issues? no 2. Do you have a personal history of Polyps? no 3. Do you have a family history of Colon Cancer or Polyps? no 4. Diabetes Mellitus? yes (Type II) 5. Joint replacements in the past 12 months?no 6. Major health problems in the past 3 months?no 7. Any artificial heart valves, MVP, or defibrillator?no    MEDICATIONS & ALLERGIES:    Patient reports the following regarding taking any anticoagulation/antiplatelet therapy:   Plavix, Coumadin, Eliquis, Xarelto, Lovenox, Pradaxa, Brilinta, or Effient? no Aspirin? yes (325 mg)  Patient confirms/reports the following medications:  Current Outpatient Medications  Medication Sig Dispense Refill   aspirin 325 MG tablet Take 325 mg by mouth daily.     cyclobenzaprine (FLEXERIL) 10 MG tablet Take 1 tablet (10 mg total) by mouth 3 (three) times daily as needed for muscle spasms. 30 tablet 0   glucose blood test strip Test fasting sugar before breakfast daily. 100 each 4   lisinopril-hydrochlorothiazide (ZESTORETIC) 10-12.5 MG tablet TAKE 1 TABLET BY MOUTH EVERY DAY 90 tablet 0   metFORMIN (GLUCOPHAGE) 500 MG tablet TAKE 1 TABLET(500 MG) BY MOUTH DAILY WITH BREAKFAST 90 tablet 3   predniSONE (DELTASONE) 10 MG tablet Taper down dosage by 1 tablets by mouth daily starting at 6 day 1, then, 5 day 2, 4 day 3, 3 day 4, 2 day 5 and 1 day 6. Divide dosage among meals and bedtime each day. 21 tablet 0   tamsulosin (FLOMAX) 0.4 MG CAPS capsule Take 1 capsule (0.4 mg total) by mouth daily. 30 capsule 3   No current facility-administered medications for this visit.    Patient confirms/reports the following allergies:  No Known Allergies  No orders of the defined types were placed in this encounter.   AUTHORIZATION  INFORMATION Primary Insurance: 1D#: Group #:  Secondary Insurance: 1D#: Group #:  SCHEDULE INFORMATION: Date: 04/02/21  Time: Location: [facility]

## 2021-04-01 ENCOUNTER — Encounter: Payer: Self-pay | Admitting: Gastroenterology

## 2021-04-02 ENCOUNTER — Encounter: Payer: Self-pay | Admitting: Gastroenterology

## 2021-04-02 ENCOUNTER — Encounter
Admission: RE | Disposition: A | Discharge status: Home / Self Care | Payer: Self-pay | Source: Ambulatory Visit | Attending: Gastroenterology

## 2021-04-02 ENCOUNTER — Ambulatory Visit: Payer: PRIVATE HEALTH INSURANCE | Admitting: Anesthesiology

## 2021-04-02 ENCOUNTER — Ambulatory Visit
Admission: RE | Admit: 2021-04-02 | Discharge: 2021-04-02 | Disposition: A | Discharge status: Home / Self Care | Payer: PRIVATE HEALTH INSURANCE | Source: Ambulatory Visit | Attending: Gastroenterology | Admitting: Gastroenterology

## 2021-04-02 DIAGNOSIS — Z79899 Other long term (current) drug therapy: Secondary | ICD-10-CM | POA: Diagnosis not present

## 2021-04-02 DIAGNOSIS — Z7984 Long term (current) use of oral hypoglycemic drugs: Secondary | ICD-10-CM | POA: Insufficient documentation

## 2021-04-02 DIAGNOSIS — Z1211 Encounter for screening for malignant neoplasm of colon: Secondary | ICD-10-CM | POA: Insufficient documentation

## 2021-04-02 DIAGNOSIS — E119 Type 2 diabetes mellitus without complications: Secondary | ICD-10-CM | POA: Insufficient documentation

## 2021-04-02 DIAGNOSIS — Z833 Family history of diabetes mellitus: Secondary | ICD-10-CM | POA: Insufficient documentation

## 2021-04-02 DIAGNOSIS — K635 Polyp of colon: Secondary | ICD-10-CM | POA: Insufficient documentation

## 2021-04-02 DIAGNOSIS — Z803 Family history of malignant neoplasm of breast: Secondary | ICD-10-CM | POA: Insufficient documentation

## 2021-04-02 DIAGNOSIS — K6389 Other specified diseases of intestine: Secondary | ICD-10-CM | POA: Insufficient documentation

## 2021-04-02 DIAGNOSIS — I10 Essential (primary) hypertension: Secondary | ICD-10-CM | POA: Insufficient documentation

## 2021-04-02 DIAGNOSIS — Z809 Family history of malignant neoplasm, unspecified: Secondary | ICD-10-CM | POA: Insufficient documentation

## 2021-04-02 DIAGNOSIS — F17220 Nicotine dependence, chewing tobacco, uncomplicated: Secondary | ICD-10-CM | POA: Diagnosis not present

## 2021-04-02 DIAGNOSIS — Z7982 Long term (current) use of aspirin: Secondary | ICD-10-CM | POA: Insufficient documentation

## 2021-04-02 DIAGNOSIS — K5730 Diverticulosis of large intestine without perforation or abscess without bleeding: Secondary | ICD-10-CM | POA: Diagnosis not present

## 2021-04-02 HISTORY — DX: Type 2 diabetes mellitus without complications: E11.9

## 2021-04-02 HISTORY — PX: COLONOSCOPY WITH PROPOFOL: SHX5780

## 2021-04-02 LAB — GLUCOSE, CAPILLARY: Glucose-Capillary: 135 mg/dL — ABNORMAL HIGH (ref 70–99)

## 2021-04-02 SURGERY — COLONOSCOPY WITH PROPOFOL
Anesthesia: General

## 2021-04-02 MED ORDER — PROPOFOL 500 MG/50ML IV EMUL
INTRAVENOUS | Status: AC
  Filled 2021-04-02: qty 50

## 2021-04-02 MED ORDER — SODIUM CHLORIDE 0.9 % IV SOLN
INTRAVENOUS | Status: DC
  Administered 2021-04-02: 20 mL/h via INTRAVENOUS

## 2021-04-02 MED ORDER — PROPOFOL 500 MG/50ML IV EMUL
INTRAVENOUS | Status: DC | PRN
  Administered 2021-04-02: 150 ug/kg/min via INTRAVENOUS

## 2021-04-02 MED ORDER — PROPOFOL 10 MG/ML IV BOLUS
INTRAVENOUS | Status: DC | PRN
  Administered 2021-04-02: 60 mg via INTRAVENOUS

## 2021-04-02 MED ORDER — LIDOCAINE HCL (CARDIAC) PF 100 MG/5ML IV SOSY
PREFILLED_SYRINGE | INTRAVENOUS | Status: DC | PRN
  Administered 2021-04-02: 50 mg via INTRAVENOUS

## 2021-04-02 NOTE — Transfer of Care (Signed)
Immediate Anesthesia Transfer of Care Note  Patient: Charles RICKETSON [name]  Procedure(s) Performed: COLONOSCOPY WITH PROPOFOL  Patient Location: PACU and Endoscopy Unit  Anesthesia Type:General  Level of Consciousness: drowsy and patient cooperative  Airway & Oxygen Therapy: Patient Spontanous Breathing  Post-op Assessment: Report given to RN and Post -op Vital signs reviewed and stable  Post vital signs: Reviewed and stable  Last Vitals:  Vitals Value Taken Time  BP 105/58 04/02/21 0945  Temp    Pulse 74 04/02/21 0946  Resp 15 04/02/21 0946  SpO2 96 % 04/02/21 0946  Vitals shown include unvalidated device data.  Last Pain:  Vitals:   04/02/21 0821  TempSrc: Temporal  PainSc: 0-No pain         Complications: No notable events documented.

## 2021-04-02 NOTE — Anesthesia Postprocedure Evaluation (Signed)
Anesthesia Post Note  Patient: Charles MANI [name]  Procedure(s) Performed: COLONOSCOPY WITH PROPOFOL  Patient location during evaluation: PACU Anesthesia Type: General Level of consciousness: awake and alert Pain management: pain level controlled Vital Signs Assessment: post-procedure vital signs reviewed and stable Respiratory status: spontaneous breathing Cardiovascular status: blood pressure returned to baseline Postop Assessment: no headache Anesthetic complications: no   No notable events documented.   Last Vitals:  Vitals:   04/02/21 0955 04/02/21 1005  BP: 120/75 124/79  Pulse: 75 70  Resp: (!) 25 16  Temp:    SpO2: 97% 100%    Last Pain:  Vitals:   04/02/21 1005  TempSrc:   PainSc: 0-No pain                 Lannette Donath

## 2021-04-02 NOTE — Op Note (Signed)
[facility] Gastroenterology Patient Name: Charles [name] Procedure Date: 04/02/2021 8:59 AM MRN: [id number] Account #: [id number] Date of Birth: 05/24/1966 Admit Type: Outpatient Age: 55 Room: [facility] ENDO ROOM 2 Gender: Male Note Status: Finalized Procedure:             Colonoscopy Indications:           Screening for colorectal malignant neoplasm Providers:             Lewin Pellow B. Maximino Greenlandahiliani MD, MD Referring MD:          Jodell Ciproennis E. Chrismon, MD (Referring MD) Medicines:             Monitored Anesthesia Care Complications:         No immediate complications. Procedure:             Pre-Anesthesia Assessment:                        - ASA Grade Assessment: II - A patient with mild                         systemic disease.                        - Prior to the procedure, a History and Physical was                         performed, and patient medications, allergies and                         sensitivities were reviewed. The patient's tolerance                         of previous anesthesia was reviewed.                        - The risks and benefits of the procedure and the                         sedation options and risks were discussed with the                         patient. All questions were answered and informed                         consent was obtained.                        - Patient identification and proposed procedure were                         verified prior to the procedure by the physician, the                         nurse, the anesthesiologist, the anesthetist and the                         technician. The procedure was verified in the                         procedure  room.                        After obtaining informed consent, the colonoscope was                         passed under direct vision. Throughout the procedure,                         the patient's blood pressure, pulse, and oxygen                         saturations were  monitored continuously. The                         Colonoscope was introduced through the anus and                         advanced to the the cecum, identified by appendiceal                         orifice and ileocecal valve. The colonoscopy was                         performed with ease. The patient tolerated the                         procedure well. The quality of the bowel preparation                         was fair except the ascending colon was poor and the                         cecum was poor. Findings:      The perianal and digital rectal examinations were normal.      A 5 mm polyp was found in the ascending colon. The polyp was sessile.       The polyp was removed with a cold snare. Resection and retrieval were       complete.      An area of mildly thickened folds of the mucosa was found at the       ileocecal valve. Biopsies were taken with a cold forceps for histology.      Two flat polyps were found in the sigmoid colon. The polyps were 4 to 5       mm in size. These polyps were removed with a cold biopsy forceps.       Resection and retrieval were complete.      Multiple diverticula were found in the sigmoid colon.      The exam was otherwise without abnormality.      The rectum, sigmoid colon, descending colon, transverse colon, ascending       colon and cecum appeared normal.      The retroflexed view of the distal rectum and anal verge was normal and       showed no anal or rectal abnormalities. Impression:            - One 5 mm polyp in the ascending colon, removed with  a cold snare. Resected and retrieved.                        - Thickened folds of the mucosa at the ileocecal                         valve. Biopsied.                        - Two 4 to 5 mm polyps in the sigmoid colon, removed                         with a cold biopsy forceps. Resected and retrieved.                        - Diverticulosis in the sigmoid colon.                         - The examination was otherwise normal.                        - The rectum, sigmoid colon, descending colon,                         transverse colon, ascending colon and cecum are normal.                        - The distal rectum and anal verge are normal on                         retroflexion view. Recommendation:        - Await pathology results.                        - High fiber diet.                        - Discharge patient to home (with escort).                        - Advance diet as tolerated.                        - Continue present medications.                        - Repeat colonoscopy in 6 months, with 2 day prep,                         because the bowel preparation was suboptimal.                        - The findings and recommendations were discussed with                         the patient.                        - The findings and recommendations were discussed with  the patient's family.                        - Return to primary care physician as previously                         scheduled. Procedure Code(s):     --- Professional ---                        [phone], Colonoscopy, flexible; with removal of                         tumor(s), polyp(s), or other lesion(s) by snare                         technique                        45380, 59, Colonoscopy, flexible; with biopsy, single                         or multiple Diagnosis Code(s):     --- Professional ---                        K63.5, Polyp of colon                        Z12.11, Encounter for screening for malignant neoplasm                         of colon                        K63.89, Other specified diseases of intestine CPT copyright 2019 American Medical Association. All rights reserved. The codes documented in this report are preliminary and upon coder review may  be revised to meet current compliance requirements.  Melodie Bouillon, MD Michel Bickers B.  Maximino Greenland MD, MD 04/02/2021 9:52:22 AM This report has been signed electronically. Number of Addenda: 0 Note Initiated On: 04/02/2021 8:59 AM Scope Withdrawal Time: 0 hours 23 minutes 33 seconds  Total Procedure Duration: 0 hours 30 minutes 5 seconds  Estimated Blood Loss:  Estimated blood loss: none.      [facility]

## 2021-04-02 NOTE — Anesthesia Preprocedure Evaluation (Addendum)
Anesthesia Evaluation  Patient identified by MRN, date of birth, ID band Patient awake    History of Anesthesia Complications Negative for: history of anesthetic complications  Airway Mallampati: II  TM Distance: >3 FB Neck ROM: Full    Dental no notable dental hx.    Pulmonary neg pulmonary ROS,    Pulmonary exam normal - rhonchi (-) wheezing      Cardiovascular Exercise Tolerance: Good hypertension, Normal cardiovascular exam Rhythm:Regular Rate:Normal - Systolic murmurs and - Diastolic murmurs 12/20/3843 TTE: INTERPRETATION  NORMAL LEFT VENTRICULAR SYSTOLIC FUNCTION WITH AN ESTIMATED EF = > 60 %  NORMAL RIGHT VENTRICULAR SYSTOLIC FUNCTION  MILD MITRAL VALVE INSUFFICIENCY  TRACE TRICUSPID VALVE INSUFFICIENCY  NO VALVULAR STENOSIS  MILD LVH   03/31/2015 ECG: NSR RBBB T wave abnormality   Neuro/Psych negative neurological ROS  negative psych ROS   GI/Hepatic negative GI ROS, Neg liver ROS,   Endo/Other  diabetes, Well Controlled  Renal/GU    Prostatitis    Musculoskeletal   Abdominal Normal abdominal exam  (+)   Peds negative pediatric ROS (+)  Hematology negative hematology ROS (+)   Anesthesia Other Findings   Reproductive/Obstetrics                            Anesthesia Physical Anesthesia Plan  ASA: 2  Anesthesia Plan: MAC   Post-op Pain Management:    Induction: Intravenous  PONV Risk Score and Plan: 0 and Propofol infusion and TIVA  Airway Management Planned: Mask  Additional Equipment:   Intra-op Plan:   Post-operative Plan:   Informed Consent: I have reviewed the patients History and Physical, chart, labs and discussed the procedure including the risks, benefits and alternatives for the proposed anesthesia with the patient or authorized representative who has indicated his/her understanding and acceptance.     Dental advisory given  Plan Discussed with:  CRNA and Anesthesiologist  Anesthesia Plan Comments: (IVGA, PIV x 1, ASA SM, RBO discussed, Consent signed.)        Anesthesia Quick Evaluation

## 2021-04-02 NOTE — Anesthesia Procedure Notes (Signed)
Procedure Name: MAC Date/Time: 04/02/2021 9:02 AM Performed by: Jerrye Noble, CRNA Pre-anesthesia Checklist: Patient identified, Emergency Drugs available, Suction available and Patient being monitored Patient Re-evaluated:Patient Re-evaluated prior to induction Oxygen Delivery Method: Nasal cannula

## 2021-04-02 NOTE — H&P (Signed)
Melodie Bouillon, MD [address], [address], [place], [place], [address] [address], [address], [place], [place], [address] Phone: [phone]  Fax: [phone]  Primary Care Physician:  Tamsen Roers, PA-C   Pre-Procedure History & Physical: HPI:  Charles [name]. is a 55 y.o. male is here for a colonoscopy.   Past Medical History:  Diagnosis Date   Diabetes mellitus without complication (HCC)    Hypertension     Past Surgical History:  Procedure Laterality Date   DISTAL BICEPS TENDON REPAIR Right 03/31/2015   Procedure: DISTAL BICEPS TENDON REPAIR;  Surgeon: Christena Flake, MD;  Location: [facility] ORS;  Service: Orthopedics;  Laterality: Right;   ELBOW SURGERY Right 1975   LEG SURGERY Right 1971    Prior to Admission medications   Medication Sig Start Date End Date Taking? Authorizing Provider  aspirin 325 MG tablet Take 325 mg by mouth daily.   Yes [provider]  cyclobenzaprine (FLEXERIL) 10 MG tablet Take 1 tablet (10 mg total) by mouth 3 (three) times daily as needed for muscle spasms. 08/04/20  Yes Chrismon, Maurine Minister E, PA-C  glucose blood test strip Test fasting sugar before breakfast daily. 06/01/18  Yes Chrismon, Jodell Cipro, PA-C  lisinopril-hydrochlorothiazide (ZESTORETIC) 10-12.5 MG tablet TAKE 1 TABLET BY MOUTH EVERY DAY 01/25/21  Yes Chrismon, Jodell Cipro, PA-C  metFORMIN (GLUCOPHAGE) 500 MG tablet TAKE 1 TABLET(500 MG) BY MOUTH DAILY WITH BREAKFAST 08/24/20  Yes Chrismon, Jodell Cipro, PA-C  tamsulosin (FLOMAX) 0.4 MG CAPS capsule Take 1 capsule (0.4 mg total) by mouth daily. 02/25/21  Yes Chrismon, Jodell Cipro, PA-C  predniSONE (DELTASONE) 10 MG tablet Taper down dosage by 1 tablets by mouth daily starting at 6 day 1, then, 5 day 2, 4 day 3, 3 day 4, 2 day 5 and 1 day 6. Divide dosage among meals and bedtime each day. Patient not taking: Reported on 03/11/2021 08/03/20   Chrismon, Jodell Cipro, PA-C    Allergies as of 03/11/2021   (No Known Allergies)    Family  History  Problem Relation Age of Onset   Breast cancer Mother    Cancer Father    Diabetes Father     Social History   Socioeconomic History   Marital status: Married    Spouse name: Not on file   Number of children: Not on file   Years of education: Not on file   Highest education level: Not on file  Occupational History   Not on file  Tobacco Use   Smoking status: Never   Smokeless tobacco: Current    Types: Chew   Tobacco comments:    has been using for 8 years  Substance and Sexual Activity   Alcohol use: No    Alcohol/week: 0.0 standard drinks   Drug use: No   Sexual activity: Not on file  Other Topics Concern   Not on file  Social History Narrative   Not on file   Social Determinants of Health   Financial Resource Strain: Not on file  Food Insecurity: Not on file  Transportation Needs: Not on file  Physical Activity: Not on file  Stress: Not on file  Social Connections: Not on file  Intimate Partner Violence: Not on file    Review of Systems: See HPI, otherwise negative ROS  Physical Exam: Constitutional: General:   Alert,  Well-developed, well-nourished, pleasant and cooperative in NAD BP (!) 146/92   Pulse 75   Temp (!) 96.5 F (35.8 C) (Temporal)  Resp 20   Ht 6\' 4"  (1.93 m)   Wt 123.4 kg   SpO2 99%   BMI 33.11 kg/m   Head: Normocephalic, atraumatic.   Eyes:  Sclera clear, no icterus.   Conjunctiva pink.   Mouth:  No deformity or lesions, oropharynx pink & moist.  Neck:  Supple, trachea midline  Respiratory: Normal respiratory effort  Gastrointestinal:  Soft, non-tender and non-distended without masses, hepatosplenomegaly or hernias noted.  No guarding or rebound tenderness.     Cardiac: No clubbing or edema.  No cyanosis. Normal posterior tibial pedal pulses noted.  Lymphatic:  No significant cervical adenopathy.  Psych:  Alert and cooperative. Normal mood and affect.  Musculoskeletal:   Symmetrical without gross deformities.  5/5 Lower extremity strength bilaterally.  Skin: Warm. Intact without significant lesions or rashes. No jaundice.  Neurologic:  Face symmetrical, tongue midline, Normal sensation to touch;  grossly normal neurologically.  Psych:  Alert and oriented x3, Alert and cooperative. Normal mood and affect.  Impression/Plan: [name] is here for a colonoscopy to be performed for average risk screening.  Risks, benefits, limitations, and alternatives regarding  colonoscopy have been reviewed with the patient.  Questions have been answered.  All parties agreeable.   Orlene Och, MD  04/02/2021, 8:54 AM

## 2021-04-05 ENCOUNTER — Telehealth: Payer: Self-pay | Admitting: Gastroenterology

## 2021-04-05 NOTE — Telephone Encounter (Signed)
Patient is ready to get Colonoscopy scheduled. Clinical staff will follow up with patient. 

## 2021-04-06 ENCOUNTER — Encounter: Payer: Self-pay | Admitting: Gastroenterology

## 2021-04-06 LAB — SURGICAL PATHOLOGY

## 2021-04-13 NOTE — Telephone Encounter (Signed)
Called patient and left a detail message that he was not due for a colonoscopy for 6 months and his last one was 04/02/2021. He will need a 2 day prep when he has the colonoscopy. Sent mychart message also. Informed patient he would receive a letter when it get closer to the time to schedule the 6 month colonoscopy

## 2021-04-14 ENCOUNTER — Other Ambulatory Visit: Payer: Self-pay

## 2021-04-14 ENCOUNTER — Encounter: Payer: Self-pay | Admitting: Gastroenterology

## 2021-04-14 ENCOUNTER — Telehealth: Payer: Self-pay

## 2021-04-14 ENCOUNTER — Telehealth: Payer: Self-pay | Admitting: Gastroenterology

## 2021-04-14 DIAGNOSIS — Z86010 Personal history of colonic polyps: Secondary | ICD-10-CM

## 2021-04-14 MED ORDER — PEG 3350-KCL-NA BICARB-NACL 420 G PO SOLR: 4000.0000 mL | Freq: Once | ORAL | 0 refills | Status: AC

## 2021-04-14 NOTE — Telephone Encounter (Signed)
Patient's wife-[phone] stated that the CMA- Melanie(nothing is documented) told them that the patient's Colonoscopy was scheduled on 04/15/2021. I informed the wife that his chart says repeat Colonoscopy in 6 months. Wife stated that if Dr. Maximino Greenland can do his procedure on 04/15/2021, please schedule him and patient has already started bowel prepping. Clinical staff will follow up with patient.

## 2021-04-14 NOTE — Telephone Encounter (Signed)
Called patient to inform them insurance has not approved procedure for tomorrow. Explained to them they may be asked to pay or receive a bill for this procedure. Number was given to pre-service to ask how much patient will be responsible for paying. Wife will call to check and see. Explained to patient why procedure was coded as Hx of polyps. Wife verbalized understanding but patient did not. Asher Muir, [place] is aware of this situation.

## 2021-04-14 NOTE — Telephone Encounter (Signed)
Please read other note that Jovon Hinton had spoken to the patient's wife in reference to this messge.

## 2021-04-14 NOTE — Progress Notes (Signed)
Per Tahiliani: patient can be scheduled for 04/15/21. This will be a repeat colonoscopy. Prep has been sent to pharmacy. Patient has been notified of procedure scheduled for tomorrow.

## 2021-04-15 ENCOUNTER — Encounter: Payer: Self-pay | Admitting: Registered Nurse

## 2021-04-15 ENCOUNTER — Ambulatory Visit
Admission: RE | Admit: 2021-04-15 | Payer: No Typology Code available for payment source | Source: Ambulatory Visit | Admitting: Gastroenterology

## 2021-04-15 ENCOUNTER — Encounter: Admission: RE | Payer: Self-pay | Source: Ambulatory Visit

## 2021-04-15 SURGERY — COLONOSCOPY WITH PROPOFOL
Anesthesia: General

## 2021-04-25 ENCOUNTER — Other Ambulatory Visit: Payer: Self-pay | Admitting: Family Medicine

## 2021-04-25 DIAGNOSIS — I10 Essential (primary) hypertension: Secondary | ICD-10-CM

## 2021-04-25 NOTE — Telephone Encounter (Signed)
Requested medication (s) are due for refill today: yes  Requested medication (s) are on the active medication list: yes  Last refill:  01/25/21  Future visit scheduled: no  Notes to clinic:  overdue lab work   Requested Prescriptions  Pending Prescriptions Disp Refills   lisinopril-hydrochlorothiazide (ZESTORETIC) 10-12.5 MG tablet [Pharmacy Med Name: LISINOPRIL-HCTZ 10/12.5MG  TABLETS] 90 tablet 0    Sig: TAKE 1 TABLET BY MOUTH EVERY DAY     Cardiovascular:  ACEI + Diuretic Combos Failed - 04/25/2021 10:21 AM      Failed - Na in normal range and within 180 days    Sodium  Date Value Ref Range Status  03/15/2020 138 135 - 145 mmol/L Final  05/18/2018 138 134 - 144 mmol/L Final  04/25/2014 137 136 - 145 mmol/L Final          Failed - K in normal range and within 180 days    Potassium  Date Value Ref Range Status  03/15/2020 3.3 (L) 3.5 - 5.1 mmol/L Final  04/25/2014 3.7 3.5 - 5.1 mmol/L Final          Failed - Cr in normal range and within 180 days    Creatinine  Date Value Ref Range Status  04/25/2014 1.11 0.60 - 1.30 mg/dL Final   Creatinine, Ser  Date Value Ref Range Status  03/15/2020 0.92 0.61 - 1.24 mg/dL Final          Failed - Ca in normal range and within 180 days    Calcium  Date Value Ref Range Status  03/15/2020 8.9 8.9 - 10.3 mg/dL Final   Calcium, Total  Date Value Ref Range Status  04/25/2014 8.5 8.5 - 10.1 mg/dL Final          Passed - Patient is not pregnant      Passed - Last BP in normal range    BP Readings from Last 1 Encounters:  04/02/21 124/79          Passed - Valid encounter within last 6 months    Recent Outpatient Visits           1 month ago Acute prostatitis   [facility] Chrismon, Jodell Cipro, PA-C   9 months ago Cervical paraspinal muscle spasm   [facility], Jodell Cipro, PA-C   11 months ago Type 2 diabetes mellitus with other specified complication, without long-term current use of  insulin (HCC)   [facility], Jodell Cipro, PA-C   1 year ago Urinary frequency   [facility], Jodell Cipro, PA-C   1 year ago Urinary frequency   [facility], Jodell Cipro, [place]

## 2021-04-26 ENCOUNTER — Other Ambulatory Visit: Payer: Self-pay | Admitting: Family Medicine

## 2021-04-26 DIAGNOSIS — I10 Essential (primary) hypertension: Secondary | ICD-10-CM

## 2021-04-26 NOTE — Telephone Encounter (Signed)
   Notes to clinic:  Patient requests 90 days supply   Requested Prescriptions  Pending Prescriptions Disp Refills   lisinopril-hydrochlorothiazide (ZESTORETIC) 10-12.5 MG tablet [Pharmacy Med Name: LISINOPRIL-HCTZ 10/12.5MG  TABLETS] 90 tablet     Sig: TAKE 1 TABLET BY MOUTH EVERY DAY     Cardiovascular:  ACEI + Diuretic Combos Failed - 04/26/2021  7:58 AM      Failed - Na in normal range and within 180 days    Sodium  Date Value Ref Range Status  03/15/2020 138 135 - 145 mmol/L Final  05/18/2018 138 134 - 144 mmol/L Final  04/25/2014 137 136 - 145 mmol/L Final          Failed - K in normal range and within 180 days    Potassium  Date Value Ref Range Status  03/15/2020 3.3 (L) 3.5 - 5.1 mmol/L Final  04/25/2014 3.7 3.5 - 5.1 mmol/L Final          Failed - Cr in normal range and within 180 days    Creatinine  Date Value Ref Range Status  04/25/2014 1.11 0.60 - 1.30 mg/dL Final   Creatinine, Ser  Date Value Ref Range Status  03/15/2020 0.92 0.61 - 1.24 mg/dL Final          Failed - Ca in normal range and within 180 days    Calcium  Date Value Ref Range Status  03/15/2020 8.9 8.9 - 10.3 mg/dL Final   Calcium, Total  Date Value Ref Range Status  04/25/2014 8.5 8.5 - 10.1 mg/dL Final          Passed - Patient is not pregnant      Passed - Last BP in normal range    BP Readings from Last 1 Encounters:  04/02/21 124/79          Passed - Valid encounter within last 6 months    Recent Outpatient Visits           2 months ago Acute prostatitis   [facility] Chrismon, Jodell Cipro, PA-C   9 months ago Cervical paraspinal muscle spasm   [facility], Jodell Cipro, PA-C   11 months ago Type 2 diabetes mellitus with other specified complication, without long-term current use of insulin (HCC)   [facility], Jodell Cipro, PA-C   1 year ago Urinary frequency   [facility], Jodell Cipro, PA-C   1  year ago Urinary frequency   [facility], Jodell Cipro, [place]

## 2021-06-11 ENCOUNTER — Other Ambulatory Visit: Payer: Self-pay | Admitting: Family Medicine

## 2021-06-11 ENCOUNTER — Telehealth: Payer: Self-pay | Admitting: Family Medicine

## 2021-06-11 DIAGNOSIS — E1165 Type 2 diabetes mellitus with hyperglycemia: Secondary | ICD-10-CM

## 2021-06-11 MED ORDER — METFORMIN HCL 500 MG PO TABS: 500.0000 mg | ORAL_TABLET | Freq: Every day | ORAL | 0 refills | Status: DC

## 2021-06-11 NOTE — Telephone Encounter (Signed)
[facility] faxed refill request for the following medications:   metFORMIN (GLUCOPHAGE) 500 MG tablet    Please advise.  

## 2021-06-14 ENCOUNTER — Other Ambulatory Visit: Payer: Self-pay | Admitting: Family Medicine

## 2021-06-14 ENCOUNTER — Ambulatory Visit: Payer: Self-pay

## 2021-06-14 DIAGNOSIS — I10 Essential (primary) hypertension: Secondary | ICD-10-CM

## 2021-06-14 NOTE — Telephone Encounter (Signed)
Patient's wife called, left VM to return the call to the office.   Message from [place] sent at 06/14/2021  4:53 PM EDT  Pt spouse requests to speak with a nurse regarding prescriptions as she was told that one of pt medications had been discontinued and she does not want to request a Rx refill if pt is not suppose to be taking it. Cb# [phone]

## 2021-06-14 NOTE — Telephone Encounter (Signed)
Medication Refill - Medication: lisinopril-hydrochlorothiazide (ZESTORETIC) 10-12.5 MG tablet  Pt is completely out of his current supply  Has the patient contacted their pharmacy? Yes.   (Agent: If no, request that the patient contact the pharmacy for the refill.) (Agent: If yes, when and what did the pharmacy advise?)  Preferred Pharmacy (with phone number or street name):  [id number] Nicholes Rough, [place] - [address] AT [facility] OF [address]  [address] [place] [address]  Phone: [phone] Fax: [phone]   Has the patient been seen for an appointment in the last year OR does the patient have an upcoming appointment? Yes.    Agent: Please be advised that RX refills may take up to 3 business days. We ask that you follow-up with your pharmacy.

## 2021-06-15 NOTE — Telephone Encounter (Signed)
Requested medication (s) are due for refill today Yes  Requested medication (s) are on the active medication list yes  Future visit scheduled Yes on 07/08/21 with Dr.Gilbert.  Note to clinic-Last refill ordered on 04/26/21 stated must have labs before additional refill. Routing to clinic for review.   Requested Prescriptions  Pending Prescriptions Disp Refills   lisinopril-hydrochlorothiazide (ZESTORETIC) 10-12.5 MG tablet 30 tablet 0    Sig: Take 1 tablet by mouth daily.     Cardiovascular:  ACEI + Diuretic Combos Failed - 06/14/2021  5:34 PM      Failed - Na in normal range and within 180 days    Sodium  Date Value Ref Range Status  03/15/2020 138 135 - 145 mmol/L Final  05/18/2018 138 134 - 144 mmol/L Final  04/25/2014 137 136 - 145 mmol/L Final          Failed - K in normal range and within 180 days    Potassium  Date Value Ref Range Status  03/15/2020 3.3 (L) 3.5 - 5.1 mmol/L Final  04/25/2014 3.7 3.5 - 5.1 mmol/L Final          Failed - Cr in normal range and within 180 days    Creatinine  Date Value Ref Range Status  04/25/2014 1.11 0.60 - 1.30 mg/dL Final   Creatinine, Ser  Date Value Ref Range Status  03/15/2020 0.92 0.61 - 1.24 mg/dL Final          Failed - Ca in normal range and within 180 days    Calcium  Date Value Ref Range Status  03/15/2020 8.9 8.9 - 10.3 mg/dL Final   Calcium, Total  Date Value Ref Range Status  04/25/2014 8.5 8.5 - 10.1 mg/dL Final          Passed - Patient is not pregnant      Passed - Last BP in normal range    BP Readings from Last 1 Encounters:  04/02/21 124/79          Passed - Valid encounter within last 6 months    Recent Outpatient Visits           3 months ago Acute prostatitis   [facility] Chrismon, Jodell Cipro, PA-C   10 months ago Cervical paraspinal muscle spasm   [facility], Jodell Cipro, PA-C   1 year ago Type 2 diabetes mellitus with other specified complication,  without long-term current use of insulin (HCC)   [facility], Jodell Cipro, PA-C   1 year ago Urinary frequency   [facility], Jodell Cipro, PA-C   1 year ago Urinary frequency   [facility], Jodell Cipro, PA-C       Future Appointments             In 3 weeks Maple Hudson., MD [facility]

## 2021-06-16 MED ORDER — LISINOPRIL-HYDROCHLOROTHIAZIDE 10-12.5 MG PO TABS: 1.0000 | ORAL_TABLET | Freq: Every day | ORAL | 3 refills | Status: DC

## 2021-07-07 ENCOUNTER — Ambulatory Visit: Payer: Self-pay | Admitting: Family Medicine

## 2021-07-08 ENCOUNTER — Ambulatory Visit (INDEPENDENT_AMBULATORY_CARE_PROVIDER_SITE_OTHER): Payer: Self-pay | Admitting: Family Medicine

## 2021-07-08 ENCOUNTER — Other Ambulatory Visit: Payer: Self-pay

## 2021-07-08 ENCOUNTER — Encounter: Payer: Self-pay | Admitting: Family Medicine

## 2021-07-08 VITALS — BP 147/75 | HR 91 | Temp 99.0°F | Resp 16 | Wt 276.0 lb

## 2021-07-08 DIAGNOSIS — Z6834 Body mass index (BMI) 34.0-34.9, adult: Secondary | ICD-10-CM

## 2021-07-08 DIAGNOSIS — N410 Acute prostatitis: Secondary | ICD-10-CM

## 2021-07-08 DIAGNOSIS — I10 Essential (primary) hypertension: Secondary | ICD-10-CM | POA: Diagnosis not present

## 2021-07-08 DIAGNOSIS — E1165 Type 2 diabetes mellitus with hyperglycemia: Secondary | ICD-10-CM

## 2021-07-08 DIAGNOSIS — E785 Hyperlipidemia, unspecified: Secondary | ICD-10-CM | POA: Diagnosis not present

## 2021-07-08 DIAGNOSIS — E669 Obesity, unspecified: Secondary | ICD-10-CM

## 2021-07-08 DIAGNOSIS — E1169 Type 2 diabetes mellitus with other specified complication: Secondary | ICD-10-CM

## 2021-07-08 MED ORDER — METFORMIN HCL 500 MG PO TABS: 500.0000 mg | ORAL_TABLET | Freq: Every day | ORAL | 3 refills | Status: DC

## 2021-07-08 MED ORDER — LISINOPRIL-HYDROCHLOROTHIAZIDE 10-12.5 MG PO TABS: 1.0000 | ORAL_TABLET | Freq: Every day | ORAL | 3 refills | Status: DC

## 2021-07-08 MED ORDER — TAMSULOSIN HCL 0.4 MG PO CAPS: 0.4000 mg | ORAL_CAPSULE | Freq: Every day | ORAL | 3 refills | Status: DC

## 2021-07-08 NOTE — Progress Notes (Signed)
Established patient visit   Patient: Charles BUTCHER [name]   DOB: 1966/01/10   55 y.o. Male  MRN: [id number] Visit Date: 07/08/2021  Today's healthcare provider: Megan Mans, MD   Chief Complaint  Patient presents with   Follow-up   Diabetes   Hypertension   Subjective    HPI  Comes in today for follow-up.  My first time seeing this very nice 55 year old woman who is married for the second marriage and he has 5 grandchildren. Feels well today and has no complaints.  He admits he has put on some weight this year. He is taking his medicines as prescribed. Diabetes Mellitus Type II, follow-up  Lab Results  Component Value Date   HGBA1C 8.1 (A) 11/01/2019   HGBA1C 9.7 (A) 08/02/2019   HGBA1C 8.3 (H) 05/18/2018   Last seen for diabetes 5 months ago.  Management since then includes continuing the same treatment. He reports good compliance with treatment. He is not having side effects. none  Home blood sugar records: fasting range: 101-120  Episodes of hypoglycemia? No none   Current insulin regiment: n/a Most Recent Eye Exam: due  --------------------------------------------------------------------------------------------------- Hypertension, follow-up  BP Readings from Last 3 Encounters:  07/08/21 (!) 147/75  04/02/21 124/79  02/25/21 132/86   Wt Readings from Last 3 Encounters:  07/08/21 276 lb (125.2 kg)  04/02/21 272 lb (123.4 kg)  02/25/21 270 lb (122.5 kg)     He was last seen for hypertension 5 months ago.  BP at that visit was 132/86. Management since that visit includes; Well controlled with Zestoretic 10-12.5 mg qd.  He reports good compliance with treatment. He is not having side effects. none He is exercising. He is not adherent to low salt diet.   Outside blood pressures are n/a.  He does not smoke.  Use of agents associated with hypertension: none.    ---------------------------------------------------------------------------------------------------     Medications: Outpatient Medications Prior to Visit  Medication Sig   aspirin 325 MG tablet Take 325 mg by mouth daily.   cyclobenzaprine (FLEXERIL) 10 MG tablet Take 1 tablet (10 mg total) by mouth 3 (three) times daily as needed for muscle spasms.   glucose blood test strip Test fasting sugar before breakfast daily.   lisinopril-hydrochlorothiazide (ZESTORETIC) 10-12.5 MG tablet Take 1 tablet by mouth daily.   metFORMIN (GLUCOPHAGE) 500 MG tablet Take 1 tablet (500 mg total) by mouth daily with breakfast. Please schedule an office visit before anymore refills.   predniSONE (DELTASONE) 10 MG tablet Taper down dosage by 1 tablets by mouth daily starting at 6 day 1, then, 5 day 2, 4 day 3, 3 day 4, 2 day 5 and 1 day 6. Divide dosage among meals and bedtime each day.   tamsulosin (FLOMAX) 0.4 MG CAPS capsule Take 1 capsule (0.4 mg total) by mouth daily.   No facility-administered medications prior to visit.    Review of Systems  Constitutional:  Negative for appetite change, chills and fever.  Respiratory:  Negative for chest tightness, shortness of breath and wheezing.   Cardiovascular:  Negative for chest pain and palpitations.  Gastrointestinal:  Negative for abdominal pain, nausea and vomiting.      Objective    BP (!) 147/75 (BP Location: Right Arm, Patient Position: Sitting, Cuff Size: Large)   Pulse 91   Temp 99 F (37.2 C) (Oral)   Resp 16   Wt 276 lb (125.2 kg)   SpO2 96%   BMI 33.60  kg/m  {Show previous vital signs (optional):23777}  Physical Exam Vitals reviewed.  Constitutional:      General: He is not in acute distress.    Appearance: He is well-developed.  HENT:     Head: Normocephalic and atraumatic.     Right Ear: Hearing normal.     Left Ear: Hearing normal.     Nose: Nose normal.  Eyes:     General: Lids are normal. No scleral icterus.        Right eye: No discharge.        Left eye: No discharge.     Conjunctiva/sclera: Conjunctivae normal.  Cardiovascular:     Rate and Rhythm: Normal rate.     Heart sounds: Normal heart sounds.  Pulmonary:     Effort: Pulmonary effort is normal. No respiratory distress.     Breath sounds: Normal breath sounds.  Abdominal:     General: Bowel sounds are normal.     Palpations: Abdomen is soft.  Musculoskeletal:     Right lower leg: No edema.     Left lower leg: No edema.  Skin:    General: Skin is warm and dry.     Findings: No lesion or rash.  Neurological:     General: No focal deficit present.     Mental Status: He is alert and oriented to person, place, and time.  Psychiatric:        Mood and Affect: Mood normal.        Speech: Speech normal.        Behavior: Behavior normal.        Thought Content: Thought content normal.        Judgment: Judgment normal.      No results found for any visits on 07/08/21.  Assessment & Plan     .1. Type 2 diabetes mellitus with other specified complication, without long-term current use of insulin (HCC) Diabetes has not been well controlled on metformin 500 mg daily.  May need to at least double the dose of this.  Sitter adding [place] or Ozempic. - Lipid Panel With LDL/HDL Ratio - Hemoglobin A1c - CBC with Differential/Platelet - Comprehensive metabolic panel - TSH  2. Essential (primary) hypertension Fair control on lisinopril HCT.  May need to increase dose on next visit - Lipid Panel With LDL/HDL Ratio - Hemoglobin A1c - CBC with Differential/Platelet - Comprehensive metabolic panel - TSH  3. Hyperlipidemia, unspecified hyperlipidemia type Probably need to add a statin depending on LDL - Lipid Panel With LDL/HDL Ratio - Hemoglobin A1c - CBC with Differential/Platelet - Comprehensive metabolic panel - TSH  4. Obesity, Class I, BMI 30-34.9 Diet and exercise discussed as I think it would address all the above issues. -  Lipid Panel With LDL/HDL Ratio - Hemoglobin A1c - CBC with Differential/Platelet - Comprehensive metabolic panel - TSH  5. Uncontrolled type 2 diabetes mellitus with hyperglycemia (HCC)   6. Acute prostatitis Resolved.  Sinew Flomax. - tamsulosin (FLOMAX) 0.4 MG CAPS capsule; Take 1 capsule (0.4 mg total) by mouth daily.  Dispense: 90 capsule; Refill: 3    No follow-ups on file.      I, Megan Mans, MD, have reviewed all documentation for this visit. The documentation on 07/14/21 for the exam, diagnosis, procedures, and orders are all accurate and complete.    Imani Sherrin Wendelyn Breslow, MD  [facility] [phone] (phone) [phone] (fax)  [facility]

## 2021-07-09 LAB — CBC WITH DIFFERENTIAL/PLATELET
Basophils Absolute: 0 10*3/uL (ref 0.0–0.2)
Basos: 1 %
EOS (ABSOLUTE): 0.2 10*3/uL (ref 0.0–0.4)
Eos: 3 %
Hematocrit: 45.4 % (ref 37.5–51.0)
Hemoglobin: 15.2 g/dL (ref 13.0–17.7)
Immature Grans (Abs): 0 10*3/uL (ref 0.0–0.1)
Immature Granulocytes: 0 %
Lymphocytes Absolute: 2.6 10*3/uL (ref 0.7–3.1)
Lymphs: 48 %
MCH: 27.2 pg (ref 26.6–33.0)
MCHC: 33.5 g/dL (ref 31.5–35.7)
MCV: 81 fL (ref 79–97)
Monocytes Absolute: 0.4 10*3/uL (ref 0.1–0.9)
Monocytes: 8 %
Neutrophils Absolute: 2.1 10*3/uL (ref 1.4–7.0)
Neutrophils: 40 %
Platelets: 243 10*3/uL (ref 150–450)
RBC: 5.58 x10E6/uL (ref 4.14–5.80)
RDW: 13.2 % (ref 11.6–15.4)
WBC: 5.3 10*3/uL (ref 3.4–10.8)

## 2021-07-09 LAB — LIPID PANEL WITH LDL/HDL RATIO
Cholesterol, Total: 186 mg/dL (ref 100–199)
HDL: 37 mg/dL — ABNORMAL LOW (ref >39)
LDL Chol Calc (NIH): 122 mg/dL — ABNORMAL HIGH (ref 0–99)
LDL/HDL Ratio: 3.3 ratio (ref 0.0–3.6)
Triglycerides: 148 mg/dL (ref 0–149)
VLDL Cholesterol Cal: 27 mg/dL (ref 5–40)

## 2021-07-09 LAB — COMPREHENSIVE METABOLIC PANEL
ALT: 38 IU/L (ref 0–44)
AST: 27 IU/L (ref 0–40)
Albumin/Globulin Ratio: 1.4 (ref 1.2–2.2)
Albumin: 4 g/dL (ref 3.8–4.9)
Alkaline Phosphatase: 90 IU/L (ref 44–121)
BUN/Creatinine Ratio: 9 (ref 9–20)
BUN: 11 mg/dL (ref 6–24)
Bilirubin Total: 0.2 mg/dL (ref 0.0–1.2)
CO2: 23 mmol/L (ref 20–29)
Calcium: 9.3 mg/dL (ref 8.7–10.2)
Chloride: 100 mmol/L (ref 96–106)
Creatinine, Ser: 1.27 mg/dL (ref 0.76–1.27)
Globulin, Total: 2.8 g/dL (ref 1.5–4.5)
Glucose: 344 mg/dL — ABNORMAL HIGH (ref 70–99)
Potassium: 4.3 mmol/L (ref 3.5–5.2)
Sodium: 136 mmol/L (ref 134–144)
Total Protein: 6.8 g/dL (ref 6.0–8.5)
eGFR: 66 mL/min/{1.73_m2} (ref >59)

## 2021-07-09 LAB — TSH: TSH: 1.59 u[IU]/mL (ref 0.450–4.500)

## 2021-07-09 LAB — HEMOGLOBIN A1C
Est. average glucose Bld gHb Est-mCnc: 232 mg/dL
Hgb A1c MFr Bld: 9.7 % — ABNORMAL HIGH (ref 4.8–5.6)

## 2021-07-13 ENCOUNTER — Other Ambulatory Visit: Payer: Self-pay | Admitting: *Deleted

## 2021-07-13 DIAGNOSIS — E1169 Type 2 diabetes mellitus with other specified complication: Secondary | ICD-10-CM

## 2021-07-13 MED ORDER — METFORMIN HCL 1000 MG PO TABS: 1000.0000 mg | ORAL_TABLET | Freq: Every day | ORAL | 3 refills | Status: DC

## 2021-11-09 ENCOUNTER — Telehealth: Payer: Self-pay

## 2021-11-09 NOTE — Telephone Encounter (Signed)
On phone with wife. Verified pt's name and DOB. Able to discuss and disclose to wife. ? ?Pt would [facility] to [facility]. ? ?New pharmacy [facility], [address] [place] ,[phone] ? ?Change made in the chart. ?

## 2021-11-16 ENCOUNTER — Ambulatory Visit: Payer: Self-pay | Admitting: *Deleted

## 2021-11-16 NOTE — Telephone Encounter (Signed)
?  Chief Complaint: blood in urine, requesting appt. ?Symptoms: blood noted in urine since yesterday . Dark tea colored and when finishing urination noted drops of blood. Mild burning with urination. Reports urgency Sunday .  ?Frequency: yesterday  ?Pertinent Negatives: Patient denies abdominal pain, low back or flank pain. No fever.  ?Disposition: [] ED /[] Urgent Care (no appt availability in office) / [x] Appointment(In office/virtual)/ []  [place] [facility]/ [] Home Care/ [] Refused Recommended Disposition /[] [place] [facility]/ []  Follow-up with PCP ?Additional Notes:  ? ?Appt scheduled 11/17/21. ? ? Reason for Disposition ? Blood in urine  (Exception: could be normal menstrual bleeding) ? ?Answer Assessment - Initial Assessment Questions ?1. COLOR of URINE: "Describe the color of the urine."  (e.g., tea-colored, pink, red, blood clots, bloody) ?    Dark red color then with bloody when finishing urinating  ?2. ONSET: "When did the bleeding start?"  ?    Yesterday . Urgency  ?3. EPISODES: "How many times has there been blood in the urine?" or "How many times today?" ?    3 since yesterday , today little red color x 1 ?4. PAIN with URINATION: "Is there any pain with passing your urine?" If Yes, ask: "How bad is the pain?"  (Scale 1-10; or mild, moderate, severe) ?   - MILD - complains slightly about urination hurting ?   - MODERATE - interferes with normal activities   ?   - SEVERE - excruciating, unwilling or unable to urinate because of the pain  ?    Mild burning  ?5. FEVER: "Do you have a fever?" If Yes, ask: "What is your temperature, how was it measured, and when did it start?" ?    Na  ?6. ASSOCIATED SYMPTOMS: "Are you passing urine more frequently than usual?" ?    na ?7. OTHER SYMPTOMS: "Do you have any other symptoms?" (e.g., back/flank pain, abdominal pain, vomiting) ?    No  ?8. PREGNANCY: "Is there any chance you are pregnant?" "When was your last menstrual period?" ?    na ? ?Protocols used:  Urine - Blood In-A-AH ? ?

## 2021-11-17 ENCOUNTER — Telehealth: Payer: Self-pay

## 2021-11-17 ENCOUNTER — Ambulatory Visit: Payer: No Typology Code available for payment source | Admitting: Family Medicine

## 2021-11-17 ENCOUNTER — Ambulatory Visit: Payer: Self-pay | Admitting: *Deleted

## 2021-11-17 NOTE — Telephone Encounter (Addendum)
?  Chief Complaint: Needs appt rescheduled.  Car broke down, missed this mornings appt. ?Symptoms: Appt for blood in urine   Triaged yesterday by Nurse Triage from [facility]. ?Frequency: N/A ?Pertinent Negatives: Patient denies N/A ?Disposition: [] ED /[x] Urgent Care (no appt availability in office) / [] Appointment(In office/virtual)/ []  [place] [facility]/ [] Home Care/ [] Refused Recommended Disposition /[] [place] [facility]/ []  Follow-up with PCP ?Additional Notes:   After speaking with in the office pt can be double booked at 1:00 with .   I called pt and he is agreeable to this time.   asked that I let her know if that works for the pt.   When I called her back the 1:00 appt had already been taken.   He will need to go to urgent care because there isn't another opening for today.   ? ?I called pt back and let him know the 1:00 appt had been taken.   He was agreeable to going to the urgent care.   ?

## 2021-11-17 NOTE — Progress Notes (Deleted)
?  ? ? ?  Established patient visit ? ? ?Patient: Charles SENTENO [name]   DOB: October 03, 1965   56 y.o. Male  MRN: [id number] ?Visit Date: 11/17/2021 ? ?Today's healthcare provider: Jacky Kindle, FNP  ? ?No chief complaint on file. ? ?Subjective  ?  ?Hematuria ?This is a new problem. The current episode started in the past 7 days. The pain is mild. He describes his urine color as tea-colored. Irritative symptoms include urgency.   ?*** ? ?Medications: ?Outpatient Medications Prior to Visit  ?Medication Sig  ? aspirin 325 MG tablet Take 325 mg by mouth daily.  ? glucose blood test strip Test fasting sugar before breakfast daily.  ? lisinopril-hydrochlorothiazide (ZESTORETIC) 10-12.5 MG tablet Take 1 tablet by mouth daily.  ? metFORMIN (GLUCOPHAGE) 1000 MG tablet Take 1 tablet (1,000 mg total) by mouth daily with breakfast.  ? tamsulosin (FLOMAX) 0.4 MG CAPS capsule Take 1 capsule (0.4 mg total) by mouth daily.  ? ?No facility-administered medications prior to visit.  ? ? ?Review of Systems  ?Genitourinary:  Positive for hematuria and urgency.  ? ?{Labs  Heme  Chem  Endocrine  Serology  Results Review (optional):23779} ?  Objective  ?  ?There were no vitals taken for this visit. ?{Show previous vital signs (optional):23777} ? ?Physical Exam  ?*** ? ?No results found for any visits on 11/17/21. ? Assessment & Plan  ?  ? ?*** ? ?No follow-ups on file.  ?   ? ?{provider attestation***:1} ? ? ?Jacky Kindle, FNP  ?[facility] ?[phone] (phone) ?[phone] (fax) ? ?[facility] ?

## 2021-11-17 NOTE — Telephone Encounter (Signed)
Former patient of Simona Huh.  He states that he and his wife saw you and you told them you would take them as patients.   ? ?

## 2021-11-17 NOTE — Telephone Encounter (Signed)
Copied from [place] [phone]. Topic: Appointment Scheduling - Scheduling Inquiry for Clinic ?>> Nov 17, 2021 11:15 AM Valere Dross wrote: ?Reason for CRM: Pt called in stating he ran out of gas this morning and missed his appt, but pt is still wanting to come in to do the urine sample, and then reschedule. Please advise. ?

## 2021-11-17 NOTE — Telephone Encounter (Signed)
Pt has called in twice because he is broke down on the side of the road.   He was on the way to his appt at [facility] at 10:40 this morning but is not going to make it.   He has disconnected both times he has called in. ? ?Agent was talking with Jiles Garter at [facility] regarding an appt when he got disconnected.   ?Reason for Disposition ?? Requesting regular office appointment ?   Needs to reschedule because broke down on way to appt this morning. ? ?Answer Assessment - Initial Assessment Questions ?1. REASON FOR CALL or QUESTION: "What is your reason for calling today?" or "How can I best help you?" or "What question do you have that I can help answer?" ?    Pt broke down on way to appt this morning for having blood in his urine.   Missed his appt.   Needing to reschedule but the line has disconnected twice.    ? ?I spoke with Jiles Garter in the office and Mikey Kirschner has a 1:00 appt today.   I called pt and he can make this appt. ? ?Protocols used: Information Only Call - No Triage-A-AH ? ?

## 2021-11-18 ENCOUNTER — Other Ambulatory Visit: Payer: Self-pay

## 2021-11-18 ENCOUNTER — Encounter: Payer: Self-pay | Admitting: Physician Assistant

## 2021-11-18 ENCOUNTER — Ambulatory Visit (INDEPENDENT_AMBULATORY_CARE_PROVIDER_SITE_OTHER): Payer: BC Managed Care – PPO | Admitting: Physician Assistant

## 2021-11-18 ENCOUNTER — Other Ambulatory Visit: Payer: Self-pay | Admitting: Physician Assistant

## 2021-11-18 VITALS — BP 152/85 | HR 79 | Resp 16 | Wt 279.8 lb

## 2021-11-18 DIAGNOSIS — R319 Hematuria, unspecified: Secondary | ICD-10-CM

## 2021-11-18 LAB — POCT URINALYSIS DIPSTICK
Bilirubin, UA: NEGATIVE
Glucose, UA: POSITIVE — AB
Ketones, UA: NEGATIVE
Leukocytes, UA: NEGATIVE
Nitrite, UA: NEGATIVE
Protein, UA: NEGATIVE
Spec Grav, UA: 1.025 (ref 1.010–1.025)
Urobilinogen, UA: 0.2 E.U./dL
pH, UA: 6 (ref 5.0–8.0)

## 2021-11-18 NOTE — Progress Notes (Signed)
?  ? ? ?Established patient visit ? ? ?Patient: Charles KNOTTS [name]   DOB: 04-Dec-1965   56 y.o. Male  MRN: [id number] ?Visit Date: 11/18/2021 ? ?Today's healthcare provider: Debera Lat, PA-C  ? ?Chief Complaint  ?Patient presents with  ? Hematuria  ? ?Subjective  ?  ?Hematuria ?This is a new problem. The current episode started in the past 7 days. The problem is unchanged. He describes the hematuria as gross hematuria. He reports no clotting in his urine stream. He describes his urine color as dark red. Irritative symptoms include urgency. Associated symptoms include dysuria and urinary retention. Pertinent negatives include no abdominal pain, bladder pain, bone pain, chills, facial swelling, fever, flank pain, genital pain, hematospermia, hesitancy, inability to urinate, nausea, vomiting or weight loss. He is sexually active. His past medical history is significant for hypertension. Risk factors include aspirin.   ?HEMATURIA ?Duration: days ?Gross hematuria: yes ?Microscopic hematuria: no ?Dipstick hematuria: no ?Duration: days ?Red cell casts: no ?Dark brown/cola colored urine: no ?Clots in urine: yes ?Recent urine culture: unclear, patient showed a few pictures ?Dysuria/urinary frequency:normal ?Urinary incontinence: no ?Urinary hesistancy/dribbling: no ?Back pain:no ?Suprapubic pain/pressure: no ?Flank pain: no ?Recent URI: sinusitis in January, bacterial for ten days , used flonase and antibiotics ?Recent vigorous exercise/trauma: no ?Status: stable ?Hx of UTI and treatment with Antibiotics ?Currently taking tamsulosin for BPH? ? ?Medications: ?Outpatient Medications Prior to Visit  ?Medication Sig  ? aspirin 325 MG tablet Take 325 mg by mouth daily.  ? glucose blood test strip Test fasting sugar before breakfast daily.  ? lisinopril-hydrochlorothiazide (ZESTORETIC) 10-12.5 MG tablet Take 1 tablet by mouth daily.  ? metFORMIN (GLUCOPHAGE) 1000 MG tablet Take 1 tablet (1,000 mg total) by mouth daily with  breakfast.  ? tamsulosin (FLOMAX) 0.4 MG CAPS capsule Take 1 capsule (0.4 mg total) by mouth daily.  ? ?No facility-administered medications prior to visit.  ? ? ?Review of Systems  ?Constitutional:  Negative for chills, fever and weight loss.  ?HENT:  Negative for facial swelling.   ?Gastrointestinal:  Negative for abdominal pain, anal bleeding, blood in stool, constipation, diarrhea, nausea and vomiting.  ?Genitourinary:  Positive for dysuria, hematuria and urgency. Negative for flank pain and hesitancy.  ? ? ?  Objective  ?  ?BP (!) 152/85   Pulse 79   Resp 16   Wt 279 lb 12.8 oz (126.9 kg)   SpO2 98%   BMI 34.06 kg/m?  ? ? ?Physical Exam ?Vitals and nursing note reviewed.  ?Constitutional:   ?   Appearance: Normal appearance. He is obese.  ?HENT:  ?   Head: Normocephalic and atraumatic.  ?   Right Ear: Tympanic membrane normal.  ?   Left Ear: Tympanic membrane normal.  ?   Nose: Nose normal.  ?   Mouth/Throat:  ?   Mouth: Mucous membranes are moist.  ?Eyes:  ?   Extraocular Movements: Extraocular movements intact.  ?   Pupils: Pupils are equal, round, and reactive to light.  ?Cardiovascular:  ?   Rate and Rhythm: Normal rate and regular rhythm.  ?   Pulses: Normal pulses.  ?Pulmonary:  ?   Effort: Pulmonary effort is normal.  ?Abdominal:  ?   General: Abdomen is flat. Bowel sounds are normal.  ?   Palpations: Abdomen is soft.  ?Neurological:  ?   Mental Status: He is alert.  ?  ? ? ?Results for orders placed or performed in visit on 11/18/21  ?POCT urinalysis  dipstick  ?Result Value Ref Range  ? Color, UA dark yellow   ? Clarity, UA clear   ? Glucose, UA Positive (A) Negative  ? Bilirubin, UA negative   ? Ketones, UA negative   ? Spec Grav, UA 1.025 1.010 - 1.025  ? Blood, UA non hemolyzed trace   ? pH, UA 6.0 5.0 - 8.0  ? Protein, UA Negative Negative  ? Urobilinogen, UA 0.2 0.2 or 1.0 E.U./dL  ? Nitrite, UA negative   ? Leukocytes, UA Negative Negative  ? Appearance    ? Odor    ? ? Assessment & Plan  ?   ?1. Hematuria, unspecified type ? ?- POCT urinalysis dipstick ?- Urinalysis, Routine w reflex microscopic ?- Urine Culture ?- Ambulatory referral to Urology ?- CBC with Differential/Platelet ?- Comprehensive Metabolic Panel (CMET) ?- CK (Creatine Kinase)  ? ?Fu with Dr. Sullivan Lone in 1 mo ? ?The patient was advised to call back or seek an in-person evaluation if the symptoms worsen or if the condition fails to improve as anticipated. ? ?I discussed the assessment and treatment plan with the patient. The patient was provided an opportunity to ask questions and all were answered. The patient agreed with the plan and demonstrated an understanding of the instructions. ? ?The entirety of the information documented in the History of Present Illness, Review of Systems and Physical Exam were personally obtained by me. Portions of this information were initially documented by the CMA and reviewed by me for thoroughness and accuracy.   ? ?[employer] as a Neurosurgeon for [employer], PA-C.,have documented all relevant documentation on the behalf of Debera Lat, PA-C,as directed by  [employer], PA-C while in the presence of [employer], PA-C.  ? ?Debera Lat, PA-C  ?[facility] ?[phone] (phone) ?[phone] (fax) ? ?[facility] ?

## 2021-11-19 LAB — COMPREHENSIVE METABOLIC PANEL
ALT: 46 IU/L — ABNORMAL HIGH (ref 0–44)
AST: 27 IU/L (ref 0–40)
Albumin/Globulin Ratio: 1.8 (ref 1.2–2.2)
Albumin: 4.2 g/dL (ref 3.8–4.9)
Alkaline Phosphatase: 86 IU/L (ref 44–121)
BUN/Creatinine Ratio: 12 (ref 9–20)
BUN: 12 mg/dL (ref 6–24)
Bilirubin Total: 0.4 mg/dL (ref 0.0–1.2)
CO2: 22 mmol/L (ref 20–29)
Calcium: 9.8 mg/dL (ref 8.7–10.2)
Chloride: 98 mmol/L (ref 96–106)
Creatinine, Ser: 1.03 mg/dL (ref 0.76–1.27)
Globulin, Total: 2.4 g/dL (ref 1.5–4.5)
Glucose: 229 mg/dL — ABNORMAL HIGH (ref 70–99)
Potassium: 4.2 mmol/L (ref 3.5–5.2)
Sodium: 138 mmol/L (ref 134–144)
Total Protein: 6.6 g/dL (ref 6.0–8.5)
eGFR: 85 mL/min/{1.73_m2} (ref >59)

## 2021-11-19 LAB — URINALYSIS, ROUTINE W REFLEX MICROSCOPIC
Bilirubin, UA: NEGATIVE
Ketones, UA: NEGATIVE
Leukocytes,UA: NEGATIVE
Nitrite, UA: NEGATIVE
Protein,UA: NEGATIVE
RBC, UA: NEGATIVE
Specific Gravity, UA: 1.026 (ref 1.005–1.030)
Urobilinogen, Ur: 0.2 mg/dL (ref 0.2–1.0)
pH, UA: 5.5 (ref 5.0–7.5)

## 2021-11-19 LAB — CBC WITH DIFFERENTIAL/PLATELET
Basophils Absolute: 0 10*3/uL (ref 0.0–0.2)
Basos: 1 %
EOS (ABSOLUTE): 0.2 10*3/uL (ref 0.0–0.4)
Eos: 3 %
Hematocrit: 45.9 % (ref 37.5–51.0)
Hemoglobin: 15.1 g/dL (ref 13.0–17.7)
Immature Grans (Abs): 0 10*3/uL (ref 0.0–0.1)
Immature Granulocytes: 0 %
Lymphocytes Absolute: 2.6 10*3/uL (ref 0.7–3.1)
Lymphs: 44 %
MCH: 26.7 pg (ref 26.6–33.0)
MCHC: 32.9 g/dL (ref 31.5–35.7)
MCV: 81 fL (ref 79–97)
Monocytes Absolute: 0.5 10*3/uL (ref 0.1–0.9)
Monocytes: 8 %
Neutrophils Absolute: 2.6 10*3/uL (ref 1.4–7.0)
Neutrophils: 44 %
Platelets: 244 10*3/uL (ref 150–450)
RBC: 5.65 x10E6/uL (ref 4.14–5.80)
RDW: 13.6 % (ref 11.6–15.4)
WBC: 5.9 10*3/uL (ref 3.4–10.8)

## 2021-11-19 LAB — CK: Total CK: 211 U/L (ref 41–331)

## 2021-11-21 LAB — URINE CULTURE: Organism ID, Bacteria: NO GROWTH

## 2021-11-26 ENCOUNTER — Other Ambulatory Visit: Payer: Self-pay | Admitting: Family Medicine

## 2021-11-26 DIAGNOSIS — E1165 Type 2 diabetes mellitus with hyperglycemia: Secondary | ICD-10-CM

## 2021-12-01 ENCOUNTER — Encounter: Payer: Self-pay | Admitting: Urology

## 2021-12-01 ENCOUNTER — Ambulatory Visit (INDEPENDENT_AMBULATORY_CARE_PROVIDER_SITE_OTHER): Payer: BC Managed Care – PPO | Admitting: Urology

## 2021-12-01 VITALS — BP 149/81 | HR 93 | Ht 76.0 in | Wt 282.0 lb

## 2021-12-01 DIAGNOSIS — R319 Hematuria, unspecified: Secondary | ICD-10-CM | POA: Diagnosis not present

## 2021-12-01 DIAGNOSIS — R310 Gross hematuria: Secondary | ICD-10-CM

## 2021-12-01 NOTE — Progress Notes (Signed)
? ?  12/01/21 ?5:02 PM  ? ?Charles Griffith [name] ?1966-05-23 ?[id number] ? ?CC: Gross hematuria ? ?HPI: ?56 year old male who presents with gross hematuria.  He reports 2 to 3 days of significant painless gross hematuria about 2 weeks ago that resolved spontaneously.  He also noted some small blood clots in the urine at that time.  He did not have any flank pain or dysuria at that time.  He denies any smoking history, or any carcinogenic exposures.  He presented to his PCP and dipstick urinalysis was benign, and urine culture was negative. ? ? ?PMH: ?Past Medical History:  ?Diagnosis Date  ? Diabetes mellitus without complication ([place])   ? Hypertension   ? ? ?Surgical History: ?Past Surgical History:  ?Procedure Laterality Date  ? COLONOSCOPY WITH PROPOFOL N/A 04/02/2021  ? Procedure: COLONOSCOPY WITH PROPOFOL;  Surgeon: Virgel Manifold, MD;  Location: [facility] ENDOSCOPY;  Service: Endoscopy;  Laterality: N/A;  ? DISTAL BICEPS TENDON REPAIR Right 03/31/2015  ? Procedure: DISTAL BICEPS TENDON REPAIR;  Surgeon: Corky Mull, MD;  Location: [facility] ORS;  Service: Orthopedics;  Laterality: Right;  ? ELBOW SURGERY Right 1975  ? LEG SURGERY Right 1971  ? ? ? ?Family History: ?Family History  ?Problem Relation Age of Onset  ? Breast cancer Mother   ? Cancer Father   ? Diabetes Father   ? ? ?Social History:  reports that he has never smoked. His smokeless tobacco use includes chew. He reports that he does not drink alcohol and does not use drugs. ? ?Physical Exam: ?BP (!) 149/81 (BP Location: Left Arm, Patient Position: Sitting, Cuff Size: Large)   Pulse 93   Ht 6\' 4"  (1.93 m)   Wt 282 lb (127.9 kg)   BMI 34.33 kg/m?   ? ?Constitutional:  Alert and oriented, No acute distress. ?Cardiovascular: No clubbing, cyanosis, or edema. ?Respiratory: Normal respiratory effort, no increased work of breathing. ?GI: Abdomen is soft, nontender, nondistended, no abdominal masses ? ?Laboratory Data: ?Reviewed, see HPI ? ?Pertinent Imaging: ?None  to review ? ?Assessment & Plan:   ?56 year old male with 2 to 3 days of painless gross hematuria that spontaneously resolved. ? ?We discussed common possible etiologies of hematuria including BPH, malignancy, urolithiasis, medical renal disease, and idiopathic. Standard workup recommended by the AUA includes imaging with CT urogram to assess the upper tracts, and cystoscopy. Cytology is performed on patient's with gross hematuria to look for malignant cells in the urine. ? ?CT urogram and cystoscopy to complete hematuria work-up ? ?Nickolas Madrid, MD ?12/01/2021 ? ?[facility] ?[address] ?[place], [place] [address] ?[phone] ? ? ?

## 2021-12-01 NOTE — Patient Instructions (Signed)

## 2021-12-02 LAB — MICROSCOPIC EXAMINATION: Bacteria, UA: NONE SEEN

## 2021-12-02 LAB — URINALYSIS, COMPLETE
Bilirubin, UA: NEGATIVE
Ketones, UA: NEGATIVE
Leukocytes,UA: NEGATIVE
Nitrite, UA: NEGATIVE
Protein,UA: NEGATIVE
RBC, UA: NEGATIVE
Specific Gravity, UA: >1.03 — ABNORMAL HIGH (ref 1.005–1.030)
Urobilinogen, Ur: 0.2 mg/dL (ref 0.2–1.0)
pH, UA: 5.5 (ref 5.0–7.5)

## 2021-12-15 ENCOUNTER — Ambulatory Visit
Admission: RE | Admit: 2021-12-15 | Discharge: 2021-12-15 | Disposition: A | Discharge status: Home / Self Care | Payer: BC Managed Care – PPO | Source: Ambulatory Visit | Attending: Urology | Admitting: Urology

## 2021-12-15 DIAGNOSIS — R319 Hematuria, unspecified: Secondary | ICD-10-CM | POA: Diagnosis present

## 2021-12-15 MED ORDER — IOHEXOL 300 MG/ML  SOLN
125.0000 mL | Freq: Once | INTRAMUSCULAR | Status: AC | PRN
  Administered 2021-12-15: 125 mL via INTRAVENOUS

## 2021-12-16 ENCOUNTER — Ambulatory Visit: Payer: BC Managed Care – PPO | Admitting: Family Medicine

## 2021-12-23 ENCOUNTER — Other Ambulatory Visit: Payer: BC Managed Care – PPO | Admitting: Urology

## 2021-12-31 ENCOUNTER — Telehealth: Payer: Self-pay | Admitting: Family Medicine

## 2021-12-31 NOTE — Telephone Encounter (Signed)
Placed in error

## 2022-01-05 ENCOUNTER — Ambulatory Visit: Payer: No Typology Code available for payment source | Admitting: Family Medicine

## 2022-01-11 ENCOUNTER — Ambulatory Visit: Payer: BC Managed Care – PPO | Admitting: Family Medicine

## 2022-01-12 ENCOUNTER — Other Ambulatory Visit: Payer: BC Managed Care – PPO | Admitting: Urology

## 2022-01-19 NOTE — Progress Notes (Deleted)
      Established patient visit   Patient: Charles NEALY [name]   DOB: 09/17/1965   56 y.o. Male  MRN: [id number] Visit Date: 01/20/2022  Today's healthcare provider: Megan Mans, MD   No chief complaint on file.  Subjective    HPI  Patient here for 1 month follow-up. Patient was seen by Debera Lat, PA-C for Hematuria. Patient was advised to follow-up with PCP.  Medications: Outpatient Medications Prior to Visit  Medication Sig   aspirin 325 MG tablet Take 325 mg by mouth daily.   chlorhexidine (PERIDEX) 0.12 % solution SMARTSIG:By Mouth   fluticasone (FLONASE) 50 MCG/ACT nasal spray Place into both nostrils.   glucose blood test strip Test fasting sugar before breakfast daily.   lisinopril-hydrochlorothiazide (ZESTORETIC) 10-12.5 MG tablet Take 1 tablet by mouth daily.   metFORMIN (GLUCOPHAGE) 1000 MG tablet Take 1 tablet (1,000 mg total) by mouth daily with breakfast.   oxyCODONE-acetaminophen (PERCOCET/ROXICET) 5-325 MG tablet Take 1 tablet by mouth every 6 (six) hours.   tamsulosin (FLOMAX) 0.4 MG CAPS capsule Take 1 capsule (0.4 mg total) by mouth daily.   No facility-administered medications prior to visit.    Review of Systems  {Labs  Heme  Chem  Endocrine  Serology  Results Review (optional):23779}   Objective    There were no vitals taken for this visit. {Show previous vital signs (optional):23777}  Physical Exam  ***  No results found for any visits on 01/20/22.  Assessment & Plan     ***  No follow-ups on file.      {provider attestation***:1}   Megan Mans, MD  [facility] [phone] (phone) [phone] (fax)  [facility]

## 2022-01-20 ENCOUNTER — Ambulatory Visit: Payer: BC Managed Care – PPO | Admitting: Family Medicine

## 2022-02-23 ENCOUNTER — Other Ambulatory Visit: Payer: BC Managed Care – PPO | Admitting: Urology

## 2022-02-25 ENCOUNTER — Encounter: Payer: Self-pay | Admitting: Urology

## 2022-07-22 ENCOUNTER — Other Ambulatory Visit: Payer: Self-pay | Admitting: Family Medicine

## 2022-07-22 DIAGNOSIS — E1169 Type 2 diabetes mellitus with other specified complication: Secondary | ICD-10-CM

## 2022-07-22 DIAGNOSIS — I10 Essential (primary) hypertension: Secondary | ICD-10-CM

## 2022-07-22 DIAGNOSIS — N410 Acute prostatitis: Secondary | ICD-10-CM

## 2022-07-22 MED ORDER — TAMSULOSIN HCL 0.4 MG PO CAPS: 0.4000 mg | ORAL_CAPSULE | Freq: Every day | ORAL | 0 refills | Status: DC

## 2022-07-22 MED ORDER — METFORMIN HCL 1000 MG PO TABS: 1000.0000 mg | ORAL_TABLET | Freq: Every day | ORAL | 0 refills | Status: DC

## 2022-07-22 MED ORDER — LISINOPRIL-HYDROCHLOROTHIAZIDE 10-12.5 MG PO TABS: 1.0000 | ORAL_TABLET | Freq: Every day | ORAL | 0 refills | Status: DC

## 2022-07-22 NOTE — Telephone Encounter (Signed)
Requested Prescriptions  Pending Prescriptions Disp Refills   lisinopril-hydrochlorothiazide (ZESTORETIC) 10-12.5 MG tablet 30 tablet 0    Sig: Take 1 tablet by mouth daily.     Cardiovascular:  ACEI + Diuretic Combos Failed - 07/22/2022  3:11 PM      Failed - Na in normal range and within 180 days    Sodium  Date Value Ref Range Status  11/18/2021 138 134 - 144 mmol/L Final  04/25/2014 137 136 - 145 mmol/L Final         Failed - K in normal range and within 180 days    Potassium  Date Value Ref Range Status  11/18/2021 4.2 3.5 - 5.2 mmol/L Final  04/25/2014 3.7 3.5 - 5.1 mmol/L Final         Failed - Cr in normal range and within 180 days    Creatinine  Date Value Ref Range Status  04/25/2014 1.11 0.60 - 1.30 mg/dL Final   Creatinine, Ser  Date Value Ref Range Status  11/18/2021 1.03 0.76 - 1.27 mg/dL Final         Failed - eGFR is 30 or above and within 180 days    EGFR (African American)  Date Value Ref Range Status  04/25/2014 >60  Final   GFR calc Af Amer  Date Value Ref Range Status  03/15/2020 >60 >60 mL/min Final   EGFR (Non-African Amer.)  Date Value Ref Range Status  04/25/2014 >60  Final    Comment:    eGFR values <50m/min/1.73 m2 may be an indication of chronic kidney disease (CKD). Calculated eGFR is useful in patients with stable renal function. The eGFR calculation will not be reliable in acutely ill patients when serum creatinine is changing rapidly. It is not useful in  patients on dialysis. The eGFR calculation may not be applicable to patients at the low and high extremes of body sizes, pregnant women, and vegetarians.    GFR calc non Af Amer  Date Value Ref Range Status  03/15/2020 >60 >60 mL/min Final   eGFR  Date Value Ref Range Status  11/18/2021 85 >59 mL/min/1.73 Final         Failed - Last BP in normal range    BP Readings from Last 1 Encounters:  12/01/21 (!) 149/81         Failed - Valid encounter within last 6 months     Recent Outpatient Visits           8 months ago Hematuria, unspecified type   [facility]   1 year ago Type 2 diabetes mellitus with other specified complication, without long-term current use of insulin ([place]   [facility]   1 year ago Acute prostatitis   [place] PA-C   1 year ago Cervical paraspinal muscle spasm   [place] Muff PA-C   2 years ago Type 2 diabetes mellitus with other specified complication, without long-term current use of insulin ([place]   [place] Muff PA-C       Future Appointments             In 3 weeks [name], [place], MD [employer] [employer] [facility]- Patient is not pregnant       metFORMIN (GLUCOPHAGE) 1000 MG tablet 30 tablet 0    Sig: Take 1  tablet (1,000 mg total) by mouth daily with breakfast.     Endocrinology:  Diabetes - Biguanides Failed - 07/22/2022  3:11 PM      Failed - HBA1C is between 0 and 7.9 and within 180 days    Hgb A1c MFr Bld  Date Value Ref Range Status  07/08/2021 9.7 (H) 4.8 - 5.6 % Final    Comment:             Prediabetes: 5.7 - 6.4          Diabetes: >6.4          Glycemic control for adults with diabetes: <7.0          Failed - B12 Level in normal range and within 720 days    No results found for: "VITAMINB12"       Failed - Valid encounter within last 6 months    Recent Outpatient Visits           8 months ago Hematuria, unspecified type   [facility], [place], PA-C   1 year ago Type 2 diabetes mellitus with other specified complication, without long-term current use of insulin ([place])   [facility] Charles Banana., MD   1 year ago Acute prostatitis   [place], Charles Muff, PA-C   1 year ago Cervical paraspinal muscle spasm    [place], Charles Muff, PA-C   2 years ago Type 2 diabetes mellitus with other specified complication, without long-term current use of insulin ([place])   [employer], Charles Muff, PA-C       Future Appointments             In 3 weeks [name], [place], MD [employer], Charles [name] in normal range and within 360 days    Creatinine  Date Value Ref Range Status  04/25/2014 1.11 0.60 - 1.30 mg/dL Final   Creatinine, Ser  Date Value Ref Range Status  11/18/2021 1.03 0.76 - 1.27 mg/dL Final         Passed - eGFR in normal range and within 360 days    EGFR (African American)  Date Value Ref Range Status  04/25/2014 >60  Final   GFR calc Af Amer  Date Value Ref Range Status  03/15/2020 >60 >60 mL/min Final   EGFR (Non-African Amer.)  Date Value Ref Range Status  04/25/2014 >60  Final    Comment:    eGFR values <53m/min/1.73 m2 may be an indication of chronic kidney disease (CKD). Calculated eGFR is useful in patients with stable renal function. The eGFR calculation will not be reliable in acutely ill patients when serum creatinine is changing rapidly. It is not useful in  patients on dialysis. The eGFR calculation may not be applicable to patients at the low and high extremes of body sizes, pregnant women, and vegetarians.    GFR calc non Af Amer  Date Value Ref Range Status  03/15/2020 >60 >60 mL/min Final   eGFR  Date Value Ref Range Status  11/18/2021 85 >59 mL/min/1.73 Final         Passed - CBC within normal limits and completed in the last 12 months    WBC  Date Value Ref Range Status  11/18/2021 5.9 3.4 - 10.8 x10E3/uL Final  03/15/2020 10.2 4.0 - 10.5 K/uL Final   RBC  Date Value Ref Range  Status  11/18/2021 5.65 4.14 - 5.80 x10E6/uL Final  03/15/2020 5.86 (H) 4.22 - 5.81 MIL/uL Final   Hemoglobin  Date Value Ref Range Status  11/18/2021 15.1 13.0 - 17.7 g/dL Final    Hematocrit  Date Value Ref Range Status  11/18/2021 45.9 37.5 - 51.0 % Final   MCHC  Date Value Ref Range Status  11/18/2021 32.9 31.5 - 35.7 g/dL Final  03/15/2020 34.5 30.0 - 36.0 g/dL Final   [facility]  Date Value Ref Range Status  11/18/2021 26.7 26.6 - 33.0 pg Final  03/15/2020 27.0 26.0 - 34.0 pg Final   MCV  Date Value Ref Range Status  11/18/2021 81 79 - 97 fL Final  04/25/2014 84 80 - 100 fL Final   No results found for: "PLTCOUNTKUC", "LABPLAT", "POCPLA" RDW  Date Value Ref Range Status  11/18/2021 13.6 11.6 - 15.4 % Final  04/25/2014 14.2 11.5 - 14.5 % Final          tamsulosin (FLOMAX) 0.4 MG CAPS capsule 90 capsule 0    Sig: Take 1 capsule (0.4 mg total) by mouth daily.     Urology: Alpha-Adrenergic Blocker Failed - 07/22/2022  3:11 PM      Failed - PSA in normal range and within 360 days    No results found for: "LABPSA", "PSA", "PSA1", "ULTRAPSA"       Failed - Last BP in normal range    BP Readings from Last 1 Encounters:  12/01/21 (!) 149/81         Passed - Valid encounter within last 12 months    Recent Outpatient Visits           8 months ago Hematuria, unspecified type   [facility] [place], [place], PA-C   1 year ago Type 2 diabetes mellitus with other specified complication, without long-term current use of insulin ([place])   [facility] Charles Banana., MD   1 year ago Acute prostatitis   [place], PA-C   1 year ago Cervical paraspinal muscle spasm   [place], Charles Muff, PA-C   2 years ago Type 2 diabetes mellitus with other specified complication, without long-term current use of insulin ([place])   [employer], Charles Muff, PA-C       Future Appointments             In 3 weeks [name], Charles [place], MD [employer], [place]

## 2022-07-22 NOTE — Telephone Encounter (Signed)
Medication Refill - Medication: lisinopril-hydrochlorothiazide (ZESTORETIC) 10-12.5 MG tablet  metFORMIN (GLUCOPHAGE) 1000 MG tablet  tamsulosin (FLOMAX) 0.4 MG CAPS capsule   Has the patient contacted their pharmacy? No. (Agent: If no, request that the patient contact the pharmacy for the refill. If patient does not wish to contact the pharmacy document the reason why and proceed with request.) (Agent: If yes, when and what did the pharmacy advise?)  Preferred Pharmacy (with phone number or street name):  [id number] [place], [place] - [address] Phone: [phone]  Fax: [phone]     Has the patient been seen for an appointment in the last year OR does the patient have an upcoming appointment? Yes.    Agent: Please be advised that RX refills may take up to 3 business days. We ask that you follow-up with your pharmacy.

## 2022-08-09 ENCOUNTER — Other Ambulatory Visit: Payer: Self-pay | Admitting: Family Medicine

## 2022-08-09 DIAGNOSIS — E1169 Type 2 diabetes mellitus with other specified complication: Secondary | ICD-10-CM

## 2022-08-10 NOTE — Telephone Encounter (Signed)
Pt must have appt for further refills.  Last reordered a courtesy refill on 07/22/22 #30 Has upcoming OV.   Requested Prescriptions  Refused Prescriptions Disp Refills   metFORMIN (GLUCOPHAGE) 1000 MG tablet [Pharmacy Med Name: METFORMIN HCL 1,000 MG TABLET] 30 tablet 0    Sig: TAKE 1 TABLET BY MOUTH EVERY DAY WITH BREAKFAST     Endocrinology:  Diabetes - Biguanides Failed - 08/09/2022  8:09 PM      Failed - HBA1C is between 0 and 7.9 and within 180 days    Hgb A1c MFr Bld  Date Value Ref Range Status  07/08/2021 9.7 (H) 4.8 - 5.6 % Final    Comment:             Prediabetes: 5.7 - 6.4          Diabetes: >6.4          Glycemic control for adults with diabetes: <7.0          Failed - B12 Level in normal range and within 720 days    No results found for: "VITAMINB12"       Failed - Valid encounter within last 6 months    Recent Outpatient Visits           8 months ago Hematuria, unspecified type   [facility] [place], [place], PA-C   1 year ago Type 2 diabetes mellitus with other specified complication, without long-term current use of insulin ([place])   [facility] [name]., MD   1 year ago Acute prostatitis   [place], Vickki Muff, PA-C   2 years ago Cervical paraspinal muscle spasm   [employer], Vickki Muff, PA-C   2 years ago Type 2 diabetes mellitus with other specified complication, without long-term current use of insulin ([place])   [employer], Vickki Muff, PA-C       Future Appointments             In 1 week Simmons-Robinson, [place], MD [employer], [place] in normal range and within 360 days    Creatinine  Date Value Ref Range Status  04/25/2014 1.11 0.60 - 1.30 mg/dL Final   Creatinine, Ser  Date Value Ref Range Status  11/18/2021 1.03 0.76 - 1.27 mg/dL Final         Passed - eGFR in normal range and within 360  days    EGFR (African American)  Date Value Ref Range Status  04/25/2014 >60  Final   GFR calc Af Amer  Date Value Ref Range Status  03/15/2020 >60 >60 mL/min Final   EGFR (Non-African Amer.)  Date Value Ref Range Status  04/25/2014 >60  Final    Comment:    eGFR values <2m/min/1.73 m2 may be an indication of chronic kidney disease (CKD). Calculated eGFR is useful in patients with stable renal function. The eGFR calculation will not be reliable in acutely ill patients when serum creatinine is changing rapidly. It is not useful in  patients on dialysis. The eGFR calculation may not be applicable to patients at the low and high extremes of body sizes, pregnant women, and vegetarians.    GFR calc non Af Amer  Date Value Ref Range Status  03/15/2020 >60 >60 mL/min Final   eGFR  Date Value Ref Range Status  11/18/2021 85 >59 mL/min/1.73 Final  Passed - CBC within normal limits and completed in the last 12 months    WBC  Date Value Ref Range Status  11/18/2021 5.9 3.4 - 10.8 x10E3/uL Final  03/15/2020 10.2 4.0 - 10.5 K/uL Final   RBC  Date Value Ref Range Status  11/18/2021 5.65 4.14 - 5.80 x10E6/uL Final  03/15/2020 5.86 (H) 4.22 - 5.81 MIL/uL Final   Hemoglobin  Date Value Ref Range Status  11/18/2021 15.1 13.0 - 17.7 g/dL Final   Hematocrit  Date Value Ref Range Status  11/18/2021 45.9 37.5 - 51.0 % Final   MCHC  Date Value Ref Range Status  11/18/2021 32.9 31.5 - 35.7 g/dL Final  03/15/2020 34.5 30.0 - 36.0 g/dL Final   [facility]  Date Value Ref Range Status  11/18/2021 26.7 26.6 - 33.0 pg Final  03/15/2020 27.0 26.0 - 34.0 pg Final   MCV  Date Value Ref Range Status  11/18/2021 81 79 - 97 fL Final  04/25/2014 84 80 - 100 fL Final   No results found for: "PLTCOUNTKUC", "LABPLAT", "POCPLA" RDW  Date Value Ref Range Status  11/18/2021 13.6 11.6 - 15.4 % Final  04/25/2014 14.2 11.5 - 14.5 % Final

## 2022-08-12 ENCOUNTER — Other Ambulatory Visit: Payer: Self-pay

## 2022-08-12 DIAGNOSIS — E1169 Type 2 diabetes mellitus with other specified complication: Secondary | ICD-10-CM

## 2022-08-12 MED ORDER — METFORMIN HCL 1000 MG PO TABS: 1000.0000 mg | ORAL_TABLET | Freq: Every day | ORAL | 0 refills | Status: DC

## 2022-08-17 NOTE — Progress Notes (Deleted)
      Established patient visit   Patient: Charles ANSELL [name]   DOB: March 26, 1966   56 y.o. Male  MRN: [id number] Visit Date: 08/18/2022  Today's healthcare provider: Ronnald Ramp, MD   No chief complaint on file.  Subjective    HPI  ***  Medications: Outpatient Medications Prior to Visit  Medication Sig   aspirin 325 MG tablet Take 325 mg by mouth daily.   chlorhexidine (PERIDEX) 0.12 % solution SMARTSIG:By Mouth   fluticasone (FLONASE) 50 MCG/ACT nasal spray Place into both nostrils.   glucose blood test strip Test fasting sugar before breakfast daily.   lisinopril-hydrochlorothiazide (ZESTORETIC) 10-12.5 MG tablet Take 1 tablet by mouth daily.   metFORMIN (GLUCOPHAGE) 1000 MG tablet Take 1 tablet (1,000 mg total) by mouth daily with breakfast.   oxyCODONE-acetaminophen (PERCOCET/ROXICET) 5-325 MG tablet Take 1 tablet by mouth every 6 (six) hours.   tamsulosin (FLOMAX) 0.4 MG CAPS capsule Take 1 capsule (0.4 mg total) by mouth daily.   No facility-administered medications prior to visit.    Review of Systems  {Labs  Heme  Chem  Endocrine  Serology  Results Review (optional):23779}   Objective    There were no vitals taken for this visit. {Show previous vital signs (optional):23777}  Physical Exam  ***  No results found for any visits on 08/18/22.  Assessment & Plan     ***  No follow-ups on file.      {provider attestation***:1}   Ronnald Ramp, MD  [facility] [phone] (phone) [phone] (fax)  [facility]

## 2022-08-18 ENCOUNTER — Ambulatory Visit: Payer: BC Managed Care – PPO | Admitting: Family Medicine

## 2022-08-18 ENCOUNTER — Other Ambulatory Visit: Payer: Self-pay | Admitting: Family Medicine

## 2022-08-21 ENCOUNTER — Other Ambulatory Visit: Payer: Self-pay | Admitting: Family Medicine

## 2022-08-21 DIAGNOSIS — I10 Essential (primary) hypertension: Secondary | ICD-10-CM

## 2022-08-22 NOTE — Telephone Encounter (Signed)
Requested medications are due for refill today.  yes  Requested medications are on the active medications list.  yes  Last refill. 07/22/2022 #30 0 rf  Future visit scheduled.   yes  Notes to clinic.  Dr. Rosanna Randy pt. Per last refill pt needs labs. Pt 3 months overdue for OV    Requested Prescriptions  Pending Prescriptions Disp Refills   lisinopril-hydrochlorothiazide (ZESTORETIC) 10-12.5 MG tablet [Pharmacy Med Name: LISINOPRIL-HCTZ 10-12.5 MG TAB] 90 tablet 1    Sig: TAKE 1 TABLET BY MOUTH EVERY DAY *NEEDS LABS*     Cardiovascular:  ACEI + Diuretic Combos Failed - 08/21/2022  1:31 PM      Failed - Na in normal range and within 180 days    Sodium  Date Value Ref Range Status  11/18/2021 138 134 - 144 mmol/L Final  04/25/2014 137 136 - 145 mmol/L Final         Failed - K in normal range and within 180 days    Potassium  Date Value Ref Range Status  11/18/2021 4.2 3.5 - 5.2 mmol/L Final  04/25/2014 3.7 3.5 - 5.1 mmol/L Final         Failed - Cr in normal range and within 180 days    Creatinine  Date Value Ref Range Status  04/25/2014 1.11 0.60 - 1.30 mg/dL Final   Creatinine, Ser  Date Value Ref Range Status  11/18/2021 1.03 0.76 - 1.27 mg/dL Final         Failed - eGFR is 30 or above and within 180 days    EGFR (African American)  Date Value Ref Range Status  04/25/2014 >60  Final   GFR calc Af Amer  Date Value Ref Range Status  03/15/2020 >60 >60 mL/min Final   EGFR (Non-African Amer.)  Date Value Ref Range Status  04/25/2014 >60  Final    Comment:    eGFR values <41m/min/1.73 m2 may be an indication of chronic kidney disease (CKD). Calculated eGFR is useful in patients with stable renal function. The eGFR calculation will not be reliable in acutely ill patients when serum creatinine is changing rapidly. It is not useful in  patients on dialysis. The eGFR calculation may not be applicable to patients at the low and high extremes of body sizes,  pregnant women, and vegetarians.    GFR calc non Af Amer  Date Value Ref Range Status  03/15/2020 >60 >60 mL/min Final   eGFR  Date Value Ref Range Status  11/18/2021 85 >59 mL/min/1.73 Final         Failed - Last BP in normal range    BP Readings from Last 1 Encounters:  12/01/21 (!) 149/81         Failed - Valid encounter within last 6 months    Recent Outpatient Visits           9 months ago [facility]aturia, unspecified type   [facility] [place] [facility]   1 year ago Type 2 diabetes mellitus with other specified complication, without long-term current use of insulin ([facility]   [facility]   1 year ago Acute prostatitis   [place]   2 years ago Cervical paraspinal muscle spasm   [place]-C   2 years ago Type 2 diabetes mellitus with other specified complication, without long-term current use of insulin ([facility]   [facility] [place]  Future Appointments             In 2 weeks Simmons-Robinson, Riki [place]er, MD [facility], [place] - Patient is not pregnant

## 2022-08-24 LAB — HM DIABETES EYE EXAM

## 2022-08-25 ENCOUNTER — Other Ambulatory Visit: Payer: Self-pay | Admitting: Family Medicine

## 2022-08-25 DIAGNOSIS — E1169 Type 2 diabetes mellitus with other specified complication: Secondary | ICD-10-CM

## 2022-08-25 DIAGNOSIS — I10 Essential (primary) hypertension: Secondary | ICD-10-CM

## 2022-08-26 NOTE — Telephone Encounter (Signed)
Requested medication (s) are due for refill today: yes  Requested medication (s) are on the active medication list: yes  Last refill:  08/12/22 #15 0 refills  Future visit scheduled: yes in 1 week  Notes to clinic:  do you want to give another courtesy refill? Pharmacy requesting #[phone].     Requested Prescriptions  Pending Prescriptions Disp Refills   metFORMIN (GLUCOPHAGE) 1000 MG tablet [Pharmacy Med Name: METFORMIN HCL 1,000 MG TABLET] 90 tablet 1    Sig: TAKE 1 TABLET BY MOUTH EVERY DAY WITH BREAKFAST     Endocrinology:  Diabetes - Biguanides Failed - 08/25/2022  9:31 AM      Failed - HBA1C is between 0 and 7.9 and within 180 days    Hgb A1c MFr Bld  Date Value Ref Range Status  07/08/2021 9.7 (H) 4.8 - 5.6 % Final    Comment:             Prediabetes: 5.7 - 6.4          Diabetes: >6.4          Glycemic control for adults with diabetes: <7.0          Failed - B12 Level in normal range and within 720 days    No results found for: "VITAMINB12"       Failed - Valid encounter within last 6 months    Recent Outpatient Visits           9 months ago Hematuria, unspecified type   [facility] [place], [place], PA-C   1 year ago Type 2 diabetes mellitus with other specified complication, without long-term current use of insulin ([place])   [facility] [name]., MD   1 year ago Acute prostatitis   [place], Vickki Muff, PA-C   2 years ago Cervical paraspinal muscle spasm   [employer], Vickki Muff, PA-C   2 years ago Type 2 diabetes mellitus with other specified complication, without long-term current use of insulin ([place])   [employer], Vickki Muff, PA-C       Future Appointments             In 1 week Simmons-Robinson, [place], MD [employer], [place] in normal range and within 360 days    Creatinine  Date Value Ref Range Status   04/25/2014 1.11 0.60 - 1.30 mg/dL Final   Creatinine, Ser  Date Value Ref Range Status  11/18/2021 1.03 0.76 - 1.27 mg/dL Final         Passed - eGFR in normal range and within 360 days    EGFR (African American)  Date Value Ref Range Status  04/25/2014 >60  Final   GFR calc Af Amer  Date Value Ref Range Status  03/15/2020 >60 >60 mL/min Final   EGFR (Non-African Amer.)  Date Value Ref Range Status  04/25/2014 >60  Final    Comment:    eGFR values <39m/min/1.73 m2 may be an indication of chronic kidney disease (CKD). Calculated eGFR is useful in patients with stable renal function. The eGFR calculation will not be reliable in acutely ill patients when serum creatinine is changing rapidly. It is not useful in  patients on dialysis. The eGFR calculation may not be applicable to patients at the low and high extremes of body sizes, pregnant women, and vegetarians.    GFR calc  non Af Amer  Date Value Ref Range Status  03/15/2020 >60 >60 mL/min Final   eGFR  Date Value Ref Range Status  11/18/2021 85 >59 mL/min/1.73 Final         Passed - CBC within normal limits and completed in the last 12 months    WBC  Date Value Ref Range Status  11/18/2021 5.9 3.4 - 10.8 x10E3/uL Final  03/15/2020 10.2 4.0 - 10.5 K/uL Final   RBC  Date Value Ref Range Status  11/18/2021 5.65 4.14 - 5.80 x10E6/uL Final  03/15/2020 5.86 (H) 4.22 - 5.81 MIL/uL Final   Hemoglobin  Date Value Ref Range Status  11/18/2021 15.1 13.0 - 17.7 g/dL Final   Hematocrit  Date Value Ref Range Status  11/18/2021 45.9 37.5 - 51.0 % Final   MCHC  Date Value Ref Range Status  11/18/2021 32.9 31.5 - 35.7 g/dL Final  03/15/2020 34.5 30.0 - 36.0 g/dL Final   [facility]  Date Value Ref Range Status  11/18/2021 26.7 26.6 - 33.0 pg Final  03/15/2020 27.0 26.0 - 34.0 pg Final   MCV  Date Value Ref Range Status  11/18/2021 81 79 - 97 fL Final  04/25/2014 84 80 - 100 fL Final   No results found for:  "PLTCOUNTKUC", "LABPLAT", "POCPLA" RDW  Date Value Ref Range Status  11/18/2021 13.6 11.6 - 15.4 % Final  04/25/2014 14.2 11.5 - 14.5 % Final

## 2022-09-02 NOTE — Progress Notes (Signed)
I,Joseline E Rosas,acting as a scribe for [employer], MD.,have documented all relevant documentation on the behalf of Ronnald Ramp, MD,as directed by  Ronnald Ramp, MD while in the presence of Ronnald Ramp, MD.   Established patient visit   Patient: Charles HOSBACH [name]   DOB: 1966/04/17   56 y.o. Male  MRN: [id number] Visit Date: 09/06/2022  Today's healthcare provider: Ronnald Ramp, MD   No chief complaint on file.  Subjective    HPI  Diabetes Mellitus Type II, follow-up  Lab Results  Component Value Date   HGBA1C 9.7 (H) 07/08/2021   HGBA1C 8.1 (A) 11/01/2019   HGBA1C 9.7 (A) 08/02/2019   Last seen for diabetes 1 years ago.  Management since then includes continuing the same treatment. He reports excellent compliance with treatment. Patient states he often has increased appetite during work lunch  He states that he has returned to working out and watching his diet  He has been focusing on having carbs during the early afternoon and protein after exercising     Home blood sugar records: fasting range: 105-110's  Episodes of hypoglycemia? No    Current insulin regiment: none Most Recent Eye Exam: last week   Hypertension, follow-up  BP Readings from Last 3 Encounters:  09/06/22 (!) 141/91  12/01/21 (!) 149/81  11/18/21 (!) 152/85   Wt Readings from Last 3 Encounters:  09/06/22 275 lb 12.8 oz (125.1 kg)  12/01/21 282 lb (127.9 kg)  11/18/21 279 lb 12.8 oz (126.9 kg)     He was last seen for hypertension 1 years ago.  BP at that visit was 147/75. Management since that visit includes no changes. May need to increase lisinopril-hctz at next visit.  He reports excellent compliance with treatment.  Outside blood pressures are 130's-140/80's.  Lipid/Cholesterol, follow-up  Last Lipid Panel: Lab Results  Component Value Date   CHOL 186 07/08/2021   LDLCALC 122 (H) 07/08/2021   HDL 37 (L)  07/08/2021   TRIG 148 07/08/2021    He was last seen for this 1 years ago.  Management since that visit includes no changes.  Last metabolic panel Lab Results  Component Value Date   GLUCOSE 229 (H) 11/18/2021   NA 138 11/18/2021   K 4.2 11/18/2021   BUN 12 11/18/2021   CREATININE 1.03 11/18/2021   EGFR 85 11/18/2021   GFRNONAA >60 03/15/2020   CALCIUM 9.8 11/18/2021   AST 27 11/18/2021   ALT 46 (H) 11/18/2021   The 10-year ASCVD risk score (Arnett DK, et al., 2019) is: 27.8%    Medications: Outpatient Medications Prior to Visit  Medication Sig   aspirin 325 MG tablet Take 325 mg by mouth daily.   chlorhexidine (PERIDEX) 0.12 % solution SMARTSIG:By Mouth   fluticasone (FLONASE) 50 MCG/ACT nasal spray Place into both nostrils.   glucose blood test strip Test fasting sugar before breakfast daily.   lisinopril-hydrochlorothiazide (ZESTORETIC) 10-12.5 MG tablet TAKE 1 TABLET BY MOUTH EVERY DAY *NEEDS LABS*   metFORMIN (GLUCOPHAGE) 1000 MG tablet Take 1 tablet (1,000 mg total) by mouth daily with breakfast.   oxyCODONE-acetaminophen (PERCOCET/ROXICET) 5-325 MG tablet Take 1 tablet by mouth every 6 (six) hours.   tamsulosin (FLOMAX) 0.4 MG CAPS capsule Take 1 capsule (0.4 mg total) by mouth daily.   No facility-administered medications prior to visit.    Review of Systems  Constitutional:  Negative for appetite change and fatigue.  Eyes:  Negative for visual disturbance.  Respiratory:  Negative  for chest tightness and shortness of breath.   Cardiovascular:  Negative for chest pain and leg swelling.  Gastrointestinal:  Negative for abdominal pain, nausea and vomiting.  Neurological:  Negative for dizziness, light-headedness and headaches.  All other systems reviewed and are negative.      Objective    BP (!) 141/91 (BP Location: Left Arm, Patient Position: Sitting, Cuff Size: Large)   Pulse 94   Resp 16   Wt 275 lb 12.8 oz (125.1 kg)   BMI 33.57 kg/m  BP Readings  from Last 3 Encounters:  09/06/22 (!) 141/91  12/01/21 (!) 149/81  11/18/21 (!) 152/85   Wt Readings from Last 3 Encounters:  09/06/22 275 lb 12.8 oz (125.1 kg)  12/01/21 282 lb (127.9 kg)  11/18/21 279 lb 12.8 oz (126.9 kg)      Physical Exam Vitals reviewed.  Constitutional:      General: He is not in acute distress.    Appearance: Normal appearance. He is not ill-appearing, toxic-appearing or diaphoretic.  Eyes:     Conjunctiva/sclera: Conjunctivae normal.  Cardiovascular:     Rate and Rhythm: Normal rate and regular rhythm.     Pulses: Normal pulses.     Heart sounds: Normal heart sounds. No murmur heard.    No friction rub. No gallop.  Pulmonary:     Effort: Pulmonary effort is normal. No respiratory distress.     Breath sounds: Normal breath sounds. No stridor. No wheezing, rhonchi or rales.  Abdominal:     General: Bowel sounds are normal. There is no distension.     Palpations: Abdomen is soft.     Tenderness: There is no abdominal tenderness.  Musculoskeletal:     Right lower leg: No edema.     Left lower leg: No edema.  Skin:    Findings: No erythema or rash.  Neurological:     Mental Status: He is alert and oriented to person, place, and time.       No results found for any visits on 09/06/22.  Assessment & Plan     Problem List Items Addressed This Visit       Cardiovascular and Mediastinum   Essential (primary) hypertension    BP mildly elevated today  No changes to medications      Relevant Orders   Comprehensive metabolic panel     Endocrine   Diabetes mellitus (HCC) - Primary    Repeat A1c today  Last A1c above goal  Currently on 1000mg  metformin once daily  Encouraged continued diet changes and exercise regimen  Recommended increasing metformin to 1000mg  twice daily  Discussed follow up depending on repeat A1c as well as recommendation for insuiln in A1c >10, patient voiced understanding       Relevant Orders   Microalbumin /  creatinine urine ratio   Hemoglobin A1c     Other   Obesity, Class I, BMI 30-34.9    CMP, A1c, lipid panel screenings      Relevant Orders   Comprehensive metabolic panel   Lipid Panel With LDL/HDL Ratio   Hyperlipidemia    Lipid panel ordered  No current statin therapy  Discussed importance of lifestyle (diet and exercise to reach and maintain goals)  The 10-year ASCVD risk score (Arnett DK, et al., 2019) is: 27.8% Will discuss next steps pending lipid panel results       Relevant Orders   Lipid Panel With LDL/HDL Ratio   Encounter for hepatitis C screening test for low  risk patient    Hep C screening ordered today       Relevant Orders   Hepatitis C antibody   Screening for colon cancer    Referral for GI, colonoscopy submitted       Relevant Orders   Ambulatory referral to Gastroenterology     No follow-ups on file.      I, Ronnald Ramp, MD, have reviewed all documentation for this visit.  Portions of this information were initially documented by the CMA and reviewed by me for thoroughness and accuracy.      Ronnald Ramp, MD  [facility] [phone] (phone) [phone] (fax)  [facility]

## 2022-09-05 DIAGNOSIS — Z1159 Encounter for screening for other viral diseases: Secondary | ICD-10-CM | POA: Insufficient documentation

## 2022-09-05 DIAGNOSIS — E785 Hyperlipidemia, unspecified: Secondary | ICD-10-CM | POA: Insufficient documentation

## 2022-09-05 DIAGNOSIS — E119 Type 2 diabetes mellitus without complications: Secondary | ICD-10-CM | POA: Insufficient documentation

## 2022-09-05 DIAGNOSIS — Z1211 Encounter for screening for malignant neoplasm of colon: Secondary | ICD-10-CM | POA: Insufficient documentation

## 2022-09-06 ENCOUNTER — Encounter: Payer: Self-pay | Admitting: Family Medicine

## 2022-09-06 ENCOUNTER — Ambulatory Visit (INDEPENDENT_AMBULATORY_CARE_PROVIDER_SITE_OTHER): Payer: 59 | Admitting: Family Medicine

## 2022-09-06 VITALS — BP 141/91 | HR 94 | Resp 16 | Wt 275.8 lb

## 2022-09-06 DIAGNOSIS — E669 Obesity, unspecified: Secondary | ICD-10-CM | POA: Diagnosis not present

## 2022-09-06 DIAGNOSIS — I10 Essential (primary) hypertension: Secondary | ICD-10-CM | POA: Diagnosis not present

## 2022-09-06 DIAGNOSIS — E1169 Type 2 diabetes mellitus with other specified complication: Secondary | ICD-10-CM | POA: Diagnosis not present

## 2022-09-06 DIAGNOSIS — Z1211 Encounter for screening for malignant neoplasm of colon: Secondary | ICD-10-CM

## 2022-09-06 DIAGNOSIS — E785 Hyperlipidemia, unspecified: Secondary | ICD-10-CM | POA: Diagnosis not present

## 2022-09-06 DIAGNOSIS — Z1159 Encounter for screening for other viral diseases: Secondary | ICD-10-CM

## 2022-09-06 DIAGNOSIS — E66811 Obesity, class 1: Secondary | ICD-10-CM

## 2022-09-06 NOTE — Assessment & Plan Note (Signed)
BP mildly elevated today  No changes to medications

## 2022-09-06 NOTE — Assessment & Plan Note (Signed)
Referral for GI, colonoscopy submitted

## 2022-09-06 NOTE — Assessment & Plan Note (Addendum)
Lipid panel ordered  No current statin therapy  Discussed importance of lifestyle (diet and exercise to reach and maintain goals)  The 10-year ASCVD risk score (Arnett DK, et al., 2019) is: 27.8% Will discuss next steps pending lipid panel results

## 2022-09-06 NOTE — Assessment & Plan Note (Signed)
CMP, A1c, lipid panel screenings

## 2022-09-06 NOTE — Assessment & Plan Note (Signed)
-   Hep C screening ordered today

## 2022-09-06 NOTE — Assessment & Plan Note (Signed)
Repeat A1c today  Last A1c above goal  Currently on 1000mg  metformin once daily  Encouraged continued diet changes and exercise regimen  Recommended increasing metformin to 1000mg  twice daily  Discussed follow up depending on repeat A1c as well as recommendation for insuiln in A1c >10, patient voiced understanding

## 2022-09-07 ENCOUNTER — Telehealth: Payer: Self-pay

## 2022-09-07 ENCOUNTER — Other Ambulatory Visit: Payer: Self-pay

## 2022-09-07 DIAGNOSIS — Z86010 Personal history of colonic polyps: Secondary | ICD-10-CM

## 2022-09-07 LAB — MICROALBUMIN / CREATININE URINE RATIO
Creatinine, Urine: 173.2 mg/dL
Microalb/Creat Ratio: 26 mg/g creat (ref 0–29)
Microalbumin, Urine: 44.2 ug/mL

## 2022-09-07 LAB — LIPID PANEL WITH LDL/HDL RATIO
Cholesterol, Total: 212 mg/dL — ABNORMAL HIGH (ref 100–199)
HDL: 40 mg/dL (ref >39)
LDL Chol Calc (NIH): 151 mg/dL — ABNORMAL HIGH (ref 0–99)
LDL/HDL Ratio: 3.8 ratio — ABNORMAL HIGH (ref 0.0–3.6)
Triglycerides: 115 mg/dL (ref 0–149)
VLDL Cholesterol Cal: 21 mg/dL (ref 5–40)

## 2022-09-07 LAB — COMPREHENSIVE METABOLIC PANEL
ALT: 48 IU/L — ABNORMAL HIGH (ref 0–44)
AST: 30 IU/L (ref 0–40)
Albumin/Globulin Ratio: 1.5 (ref 1.2–2.2)
Albumin: 4.3 g/dL (ref 3.8–4.9)
Alkaline Phosphatase: 96 IU/L (ref 44–121)
BUN/Creatinine Ratio: 10 (ref 9–20)
BUN: 11 mg/dL (ref 6–24)
Bilirubin Total: 0.4 mg/dL (ref 0.0–1.2)
CO2: 23 mmol/L (ref 20–29)
Calcium: 9.5 mg/dL (ref 8.7–10.2)
Chloride: 100 mmol/L (ref 96–106)
Creatinine, Ser: 1.05 mg/dL (ref 0.76–1.27)
Globulin, Total: 2.9 g/dL (ref 1.5–4.5)
Glucose: 246 mg/dL — ABNORMAL HIGH (ref 70–99)
Potassium: 4 mmol/L (ref 3.5–5.2)
Sodium: 138 mmol/L (ref 134–144)
Total Protein: 7.2 g/dL (ref 6.0–8.5)
eGFR: 82 mL/min/{1.73_m2} (ref >59)

## 2022-09-07 LAB — HEMOGLOBIN A1C
Est. average glucose Bld gHb Est-mCnc: 298 mg/dL
Hgb A1c MFr Bld: 12 % — ABNORMAL HIGH (ref 4.8–5.6)

## 2022-09-07 LAB — HEPATITIS C ANTIBODY: Hep C Virus Ab: NONREACTIVE

## 2022-09-07 MED ORDER — GOLYTELY 236 G PO SOLR: 4000.0000 mL | Freq: Once | ORAL | 0 refills | Status: AC

## 2022-09-07 NOTE — Telephone Encounter (Signed)
Gastroenterology Pre-Procedure Review  Request Date: 10/07/22 Requesting Physician: Dr. Marius Ditch  PATIENT REVIEW QUESTIONS: The patient responded to the following health history questions as indicated:    1. Are you having any GI issues? no 2. Do you have a personal history of Polyps? yes (last colonoscopy 04/02/21 performed by Dr. Bonna Gains) 3. Do you have a family history of Colon Cancer or Polyps? no 4. Diabetes Mellitus? yes (takes metformin has been advised to stop 2 days prior to colonoscopy) 5. Joint replacements in the past 12 months?no 6. Major health problems in the past 3 months?no 7. Any artificial heart valves, MVP, or defibrillator?no    MEDICATIONS & ALLERGIES:    Patient reports the following regarding taking any anticoagulation/antiplatelet therapy:   Plavix, Coumadin, Eliquis, Xarelto, Lovenox, Pradaxa, Brilinta, or Effient? no Aspirin? yes (Takes Aspirin 325mg  self prescribed advised to stop 3 days prior to colonoscopy)  Patient confirms/reports the following medications:  Current Outpatient Medications  Medication Sig Dispense Refill   aspirin 325 MG tablet Take 325 mg by mouth daily.     chlorhexidine (PERIDEX) 0.12 % solution SMARTSIG:By Mouth     fluticasone (FLONASE) 50 MCG/ACT nasal spray Place into both nostrils.     glucose blood test strip Test fasting sugar before breakfast daily. 100 each 4   lisinopril-hydrochlorothiazide (ZESTORETIC) 10-12.5 MG tablet TAKE 1 TABLET BY MOUTH EVERY DAY *NEEDS LABS* 30 tablet 0   metFORMIN (GLUCOPHAGE) 1000 MG tablet Take 1 tablet (1,000 mg total) by mouth daily with breakfast. 30 tablet 0   oxyCODONE-acetaminophen (PERCOCET/ROXICET) 5-325 MG tablet Take 1 tablet by mouth every 6 (six) hours.     tamsulosin (FLOMAX) 0.4 MG CAPS capsule Take 1 capsule (0.4 mg total) by mouth daily. 90 capsule 0   No current facility-administered medications for this visit.    Patient confirms/reports the following allergies:  No Known  Allergies  No orders of the defined types were placed in this encounter.   AUTHORIZATION INFORMATION Primary Insurance: 1D#: Group #:  Secondary Insurance: 1D#: Group #:  SCHEDULE INFORMATION: Date:  Time: Location:

## 2022-09-08 ENCOUNTER — Other Ambulatory Visit: Payer: Self-pay | Admitting: Family Medicine

## 2022-09-08 DIAGNOSIS — I10 Essential (primary) hypertension: Secondary | ICD-10-CM

## 2022-09-13 ENCOUNTER — Other Ambulatory Visit: Payer: Self-pay | Admitting: Family Medicine

## 2022-09-13 DIAGNOSIS — E1169 Type 2 diabetes mellitus with other specified complication: Secondary | ICD-10-CM

## 2022-09-21 NOTE — Progress Notes (Signed)
I,Connie R Striblin,acting as a Education administrator for [employer], MD.,have documented all relevant documentation on the behalf of [name], MD,as directed by  [name], MD while in the presence of [name], MD.   Established patient visit   Patient: Charles PEVEHOUSE [name]   DOB: [birth year]   57 y.o. Male  MRN: [id number] Visit Date: 09/22/2022  Today's healthcare provider: [name], MD   Chief Complaint  Patient presents with   Follow-up   Diabetes   Subjective    HPI      Follow up for Lab Work  The patient was last seen 1 weeks ago.  A1c was elevated at 12, previously recommended starting insulin therapy if A1c greater than 10 He states that he has been eating more fast food due to convenience or eating easily prepared meals like spaghetti, hot dogs  He tries to balance those meals by working out afterwards   Diabetes Mellitus Type II, Follow-up  Lab Results  Component Value Date   HGBA1C 12.0 (H) 09/06/2022   HGBA1C 9.7 (H) 07/08/2021   HGBA1C 8.1 (A) 11/01/2019   Wt Readings from Last 3 Encounters:  09/22/22 276 lb 9.6 oz (125.5 kg)  09/06/22 275 lb 12.8 oz (125.1 kg)  12/01/21 282 lb (127.9 kg)   Last seen for diabetes 3 weeks ago.  Management since then includes being encourage to continue diet changes and exercise regimen  Recommended increasing metformin to 1000mg  twice daily He reports good compliance with treatment. Pt has been out of his metformin for 4 days, and will begin to take it 2x daily tomorrow.  Pt states that he has began exercising and incorporating diet changes.  He is not having side effects.   Home blood sugar records:   ranging around 110-120  Episodes of hypoglycemia? No  Pertinent Labs: Lab Results  Component Value Date   CHOL 212 (H) 09/06/2022   HDL 40 09/06/2022   LDLCALC 151 (H) 09/06/2022   TRIG 115 09/06/2022   Lab Results  Component Value Date   NA 138  09/06/2022   K 4.0 09/06/2022   CREATININE 1.05 09/06/2022   EGFR 82 09/06/2022   LABMICR 44.2 09/06/2022   MICRALBCREAT 26 09/06/2022      -----------------------------------------------------------------------------------------   Medications: Outpatient Medications Prior to Visit  Medication Sig   aspirin 325 MG tablet Take 325 mg by mouth daily.   chlorhexidine (PERIDEX) 0.12 % solution SMARTSIG:By Mouth   fluticasone (FLONASE) 50 MCG/ACT nasal spray Place into both nostrils.   glucose blood test strip Test fasting sugar before breakfast daily.   lisinopril-hydrochlorothiazide (ZESTORETIC) 10-12.5 MG tablet Take 1 tablet by mouth daily.   metFORMIN (GLUCOPHAGE) 1000 MG tablet TAKE 1 TABLET BY MOUTH EVERY DAY WITH BREAKFAST   oxyCODONE-acetaminophen (PERCOCET/ROXICET) 5-325 MG tablet Take 1 tablet by mouth every 6 (six) hours.   tamsulosin (FLOMAX) 0.4 MG CAPS capsule Take 1 capsule (0.4 mg total) by mouth daily.   No facility-administered medications prior to visit.    Review of Systems     Objective    BP (!) 148/100 (BP Location: Right Arm, Patient Position: Sitting, Cuff Size: Large)   Pulse 95   Temp 98.4 F (36.9 C) (Oral)   Wt 276 lb 9.6 oz (125.5 kg)   SpO2 99%   BMI 33.67 kg/m    Physical Exam Vitals reviewed.  Constitutional:      General: He is not in acute distress.    Appearance: Normal appearance.  He is not ill-appearing, toxic-appearing or diaphoretic.  Eyes:     Conjunctiva/sclera: Conjunctivae normal.  Cardiovascular:     Rate and Rhythm: Normal rate and regular rhythm.     Pulses: Normal pulses.     Heart sounds: Normal heart sounds. No murmur heard.    No friction rub. No gallop.  Pulmonary:     Effort: Pulmonary effort is normal. No respiratory distress.     Breath sounds: Normal breath sounds. No stridor. No wheezing, rhonchi or rales.  Abdominal:     General: Bowel sounds are normal. There is no distension.     Palpations: Abdomen is  soft.     Tenderness: There is no abdominal tenderness.  Musculoskeletal:     Right lower leg: Edema present.     Left lower leg: Edema present.     Comments: 1+ bilaterally  Skin:    Findings: No erythema or rash.  Neurological:     Mental Status: He is alert and oriented to person, place, and time.       No results found for any visits on 09/22/22.  Assessment & Plan     Problem List Items Addressed This Visit       Endocrine   Diabetes mellitus ([place]) - Primary    DM not at goal  A1c increased from prior level, now 12 Will add lantus 12 units daily to metformin 1000mg  twice daily  CBG checks regimen recommended fasting in AM and after largest meal, 2 hours PP recommended   Next A1c due in 3 months  Eye exam due On ACE/ARB & statin therapy, currently on lisinopril and no statin, recommend discussing adding medication for lipid control given elevated ASCVD Will wait to discuss additional therapies as patient appears to be somewhat overwhelmed with the addition of insulin for DM       Relevant Medications   insulin glargine (LANTUS SOLOSTAR) 100 UNIT/ML Solostar Pen   blood glucose meter kit and supplies     Return in about 1 month (around 10/23/2022) for DM.       The entirety of the information documented in the History of Present Illness, Review of Systems and Physical Exam were personally obtained by me. Portions of this information were initially documented by Eduard Clos, CMA and reviewed by me for thoroughness and accuracy.[name], MD     [name], MD  [facility] [phone] (phone) [phone] (fax)  [place]

## 2022-09-22 ENCOUNTER — Encounter: Payer: Self-pay | Admitting: Family Medicine

## 2022-09-22 ENCOUNTER — Ambulatory Visit (INDEPENDENT_AMBULATORY_CARE_PROVIDER_SITE_OTHER): Payer: 59 | Admitting: Family Medicine

## 2022-09-22 VITALS — BP 148/100 | HR 95 | Temp 98.4°F | Wt 276.6 lb

## 2022-09-22 DIAGNOSIS — E1165 Type 2 diabetes mellitus with hyperglycemia: Secondary | ICD-10-CM

## 2022-09-22 DIAGNOSIS — E1169 Type 2 diabetes mellitus with other specified complication: Secondary | ICD-10-CM | POA: Diagnosis not present

## 2022-09-22 MED ORDER — BLOOD GLUCOSE METER KIT: PACK | 0 refills | Status: DC

## 2022-09-22 MED ORDER — LANTUS SOLOSTAR 100 UNIT/ML ~~LOC~~ SOPN: 12.0000 [IU] | PEN_INJECTOR | Freq: Every day | SUBCUTANEOUS | 1 refills | Status: DC

## 2022-09-22 NOTE — Patient Instructions (Addendum)
Your recent hemoglobin A1c was elevated at 12.   I recommend continuing your metformin twice daily along with starting the insulin once daily.   Please check your blood glucose in the morning before eating to measure your fasting levels and then check 2 hours after your largest meal of the day (after dinner).   Please inject 12 units daily   Please follow up with me in 1 month for diabetes  Diabetes Mellitus and Nutrition, Adult When you have diabetes, or diabetes mellitus, it is very important to have healthy eating habits because your blood sugar (glucose) levels are greatly affected by what you eat and drink. Eating healthy foods in the right amounts, at about the same times every day, can help you: Manage your blood glucose. Lower your risk of heart disease. Improve your blood pressure. Reach or maintain a healthy weight. What can affect my meal plan? Every person with diabetes is different, and each person has different needs for a meal plan. Your health care provider may recommend that you work with a dietitian to make a meal plan that is best for you. Your meal plan may vary depending on factors such as: The calories you need. The medicines you take. Your weight. Your blood glucose, blood pressure, and cholesterol levels. Your activity level. Other health conditions you have, such as heart or kidney disease. How do carbohydrates affect me? Carbohydrates, also called carbs, affect your blood glucose level more than any other type of food. Eating carbs raises the amount of glucose in your blood. It is important to know how many carbs you can safely have in each meal. This is different for every person. Your dietitian can help you calculate how many carbs you should have at each meal and for each snack. How does alcohol affect me? Alcohol can cause a decrease in blood glucose (hypoglycemia), especially if you use insulin or take certain diabetes medicines by mouth. Hypoglycemia can be  a life-threatening condition. Symptoms of hypoglycemia, such as sleepiness, dizziness, and confusion, are similar to symptoms of having too much alcohol. Do not drink alcohol if: Your health care provider tells you not to drink. You are pregnant, may be pregnant, or are planning to become pregnant. If you drink alcohol: Limit how much you have to: 0-1 drink a day for women. 0-2 drinks a day for men. Know how much alcohol is in your drink. In the [place] one drink equals one 12 oz bottle of beer (355 mL), one 5 oz glass of wine (148 mL), or one 1 oz glass of hard liquor (44 mL). Keep yourself hydrated with water, diet soda, or unsweetened iced tea. Keep in mind that regular soda, juice, and other mixers may contain a lot of sugar and must be counted as carbs. What are tips for following this plan?  Reading food labels Start by checking the serving size on the Nutrition Facts label of packaged foods and drinks. The number of calories and the amount of carbs, fats, and other nutrients listed on the label are based on one serving of the item. Many items contain more than one serving per package. Check the total grams (g) of carbs in one serving. Check the number of grams of saturated fats and trans fats in one serving. Choose foods that have a low amount or none of these fats. Check the number of milligrams (mg) of salt (sodium) in one serving. Most people should limit total sodium intake to less than 2,300 mg per day.  Always check the nutrition information of foods labeled as "low-fat" or "nonfat." These foods may be higher in added sugar or refined carbs and should be avoided. Talk to your dietitian to identify your daily goals for nutrients listed on the label. Shopping Avoid buying canned, pre-made, or processed foods. These foods tend to be high in fat, sodium, and added sugar. Shop around the outside edge of the grocery store. This is where you will most often find fresh fruits and vegetables,  bulk grains, fresh meats, and fresh dairy products. Cooking Use low-heat cooking methods, such as baking, instead of high-heat cooking methods, such as deep frying. Cook using healthy oils, such as olive, canola, or sunflower oil. Avoid cooking with butter, cream, or high-fat meats. Meal planning Eat meals and snacks regularly, preferably at the same times every day. Avoid going long periods of time without eating. Eat foods that are high in fiber, such as fresh fruits, vegetables, beans, and whole grains. Eat 4-6 oz (112-168 g) of lean protein each day, such as lean meat, chicken, fish, eggs, or tofu. One ounce (oz) (28 g) of lean protein is equal to: 1 oz (28 g) of meat, chicken, or fish. 1 egg.  cup (62 g) of tofu. Eat some foods each day that contain healthy fats, such as avocado, nuts, seeds, and fish. What foods should I eat? Fruits Berries. Apples. Oranges. Peaches. Apricots. Plums. Grapes. Mangoes. Papayas. Pomegranates. Kiwi. Cherries. Vegetables Leafy greens, including lettuce, spinach, kale, chard, collard greens, mustard greens, and cabbage. Beets. Cauliflower. Broccoli. Carrots. Green beans. Tomatoes. Peppers. Onions. Cucumbers. Brussels sprouts. Grains Whole grains, such as whole-wheat or whole-grain bread, crackers, tortillas, cereal, and pasta. Unsweetened oatmeal. Quinoa. Brown or wild rice. Meats and other proteins Seafood. Poultry without skin. Lean cuts of poultry and beef. Tofu. Nuts. Seeds. Dairy Low-fat or fat-free dairy products such as milk, yogurt, and cheese. The items listed above may not be a complete list of foods and beverages you can eat and drink. Contact a dietitian for more information. What foods should I avoid? Fruits Fruits canned with syrup. Vegetables Canned vegetables. Frozen vegetables with butter or cream sauce. Grains Refined white flour and flour products such as bread, pasta, snack foods, and cereals. Avoid all processed foods. Meats and  other proteins Fatty cuts of meat. Poultry with skin. Breaded or fried meats. Processed meat. Avoid saturated fats. Dairy Full-fat yogurt, cheese, or milk. Beverages Sweetened drinks, such as soda or iced tea. The items listed above may not be a complete list of foods and beverages you should avoid. Contact a dietitian for more information. Questions to ask a health care provider Do I need to meet with a certified diabetes care and education specialist? Do I need to meet with a dietitian? What number can I call if I have questions? When are the best times to check my blood glucose? Where to find more information: American Diabetes Association: diabetes.org Academy of Nutrition and Dietetics: eatright.[employer] of Diabetes and Digestive and Kidney Diseases: StageSync.si Association of Diabetes Care & Education Specialists: diabeteseducator.org Summary It is important to have healthy eating habits because your blood sugar (glucose) levels are greatly affected by what you eat and drink. It is important to use alcohol carefully. A healthy meal plan will help you manage your blood glucose and lower your risk of heart disease. Your health care provider may recommend that you work with a dietitian to make a meal plan that is best for you. This information is  not intended to replace advice given to you by your health care provider. Make sure you discuss any questions you have with your health care provider. Document Revised: 03/25/2020 Document Reviewed: 03/25/2020 Elsevier Patient Education  [place].

## 2022-09-23 ENCOUNTER — Telehealth: Payer: Self-pay | Admitting: Family Medicine

## 2022-09-23 NOTE — Assessment & Plan Note (Addendum)
DM not at goal  A1c increased from prior level, now 12 Will add lantus 12 units daily to metformin 1000mg  twice daily  CBG checks regimen recommended fasting in AM and after largest meal, 2 hours PP recommended   Next A1c due in 3 months  Eye exam due On ACE/ARB & statin therapy, currently on lisinopril and no statin, recommend discussing adding medication for lipid control given elevated ASCVD Will wait to discuss additional therapies as patient appears to be somewhat overwhelmed with the addition of insulin for DM

## 2022-09-23 NOTE — Telephone Encounter (Signed)
Pt needs Rx for needles to go with the insulin glargine (LANTUS SOLOSTAR) 100 UNIT/ML Solostar Pen   He also needs RX for test strips for the blood glucose meter kit and supplies   [id number] - [place], [place]  Pt asked if this can be sent to the pharmacy by noon so pt can check his Blood sugar

## 2022-09-23 NOTE — Telephone Encounter (Signed)
Pt's wife called back saying he needs needles and test strips for the Accucheck and the screw on caps for the Lantis. She had to check to make sure what he needed.  [place]

## 2022-09-26 ENCOUNTER — Other Ambulatory Visit: Payer: Self-pay | Admitting: Family Medicine

## 2022-09-26 MED ORDER — GLUCOSE BLOOD VI STRP: ORAL_STRIP | 12 refills | Status: DC

## 2022-09-26 MED ORDER — SURE-FINE PEN NEEDLES 31G X 8 MM MISC: 1.0000 | Freq: Every day | 6 refills | Status: DC

## 2022-09-26 NOTE — Telephone Encounter (Signed)
Pt has not been able to use insulin or check BS / he is in need of the pen needles to go with his Lantus solostar pen and he needs the test strips to go with the Accu chek meter her was prescribed today asap / please advise and send to [facility] [address]

## 2022-09-26 NOTE — Telephone Encounter (Signed)
This has been sent in for patient

## 2022-09-26 NOTE — Telephone Encounter (Signed)
Pt wife is calling back to request test strips for the machine. Pt was prescribed the accucheck machine needing test strips.  Requesting nurse to call back CB- [phone]

## 2022-09-26 NOTE — Progress Notes (Signed)
100 Accu chek Blood glucose test strips and lantus needles 42mm have been sent to patient's pharmacy.   [name], MD  [facility]

## 2022-09-27 ENCOUNTER — Other Ambulatory Visit: Payer: Self-pay

## 2022-09-27 MED ORDER — GLUCOSE BLOOD VI STRP: ORAL_STRIP | 12 refills | Status: AC

## 2022-09-27 NOTE — Telephone Encounter (Signed)
Resent with quantity 100. With diagnosis code, and SIG.

## 2022-09-27 NOTE — Telephone Encounter (Signed)
Pt's wife called needing the quantity for the test strips to be sent the pharmacy.  They told her it has to be either 50 or 100.  She said the pharmacy is needing clarification on the prescription.

## 2022-09-29 ENCOUNTER — Encounter: Payer: Self-pay | Admitting: Family Medicine

## 2022-10-07 ENCOUNTER — Ambulatory Visit: Payer: 59 | Admitting: Registered Nurse

## 2022-10-07 ENCOUNTER — Other Ambulatory Visit: Payer: Self-pay

## 2022-10-07 ENCOUNTER — Encounter: Payer: Self-pay | Admitting: Gastroenterology

## 2022-10-07 ENCOUNTER — Encounter
Admission: RE | Disposition: A | Discharge status: Home / Self Care | Payer: Self-pay | Source: Home / Self Care | Attending: Gastroenterology

## 2022-10-07 ENCOUNTER — Ambulatory Visit
Admission: RE | Admit: 2022-10-07 | Discharge: 2022-10-07 | Disposition: A | Discharge status: Home / Self Care | Payer: 59 | Attending: Gastroenterology | Admitting: Gastroenterology

## 2022-10-07 DIAGNOSIS — Z87891 Personal history of nicotine dependence: Secondary | ICD-10-CM | POA: Insufficient documentation

## 2022-10-07 DIAGNOSIS — I10 Essential (primary) hypertension: Secondary | ICD-10-CM | POA: Insufficient documentation

## 2022-10-07 DIAGNOSIS — Z86010 Personal history of colonic polyps: Secondary | ICD-10-CM

## 2022-10-07 DIAGNOSIS — Z1211 Encounter for screening for malignant neoplasm of colon: Secondary | ICD-10-CM | POA: Insufficient documentation

## 2022-10-07 DIAGNOSIS — E119 Type 2 diabetes mellitus without complications: Secondary | ICD-10-CM | POA: Diagnosis not present

## 2022-10-07 HISTORY — PX: COLONOSCOPY WITH PROPOFOL: SHX5780

## 2022-10-07 LAB — GLUCOSE, CAPILLARY: Glucose-Capillary: 199 mg/dL — ABNORMAL HIGH (ref 70–99)

## 2022-10-07 SURGERY — COLONOSCOPY WITH PROPOFOL
Anesthesia: General

## 2022-10-07 MED ORDER — SIMETHICONE 40 MG/0.6ML PO SUSP
ORAL | Status: DC | PRN
  Administered 2022-10-07: 120 mL

## 2022-10-07 MED ORDER — DEXMEDETOMIDINE HCL 200 MCG/2ML IV SOLN
INTRAVENOUS | Status: DC | PRN
  Administered 2022-10-07: 12 ug via INTRAVENOUS

## 2022-10-07 MED ORDER — LIDOCAINE HCL (CARDIAC) PF 100 MG/5ML IV SOSY
PREFILLED_SYRINGE | INTRAVENOUS | Status: DC | PRN
  Administered 2022-10-07: 100 mg via INTRAVENOUS

## 2022-10-07 MED ORDER — PROPOFOL 500 MG/50ML IV EMUL
INTRAVENOUS | Status: DC | PRN
  Administered 2022-10-07: 181.794 ug/kg/min via INTRAVENOUS

## 2022-10-07 MED ORDER — SODIUM CHLORIDE 0.9 % IV SOLN: INTRAVENOUS | Status: DC

## 2022-10-07 MED ORDER — PROPOFOL 10 MG/ML IV BOLUS
INTRAVENOUS | Status: DC | PRN
  Administered 2022-10-07: 120 mg via INTRAVENOUS

## 2022-10-07 NOTE — H&P (Signed)
Cephas Darby, MD [address]  [place]  [place], [place] [address]  Main: [phone]  Fax: [phone] Pager: [phone]  Primary Care Physician:  [name], MD Primary Gastroenterologist:  Dr. Cephas Darby  Pre-Procedure History & Physical: HPI:  Charles [name]. is a 57 y.o. male is here for an colonoscopy.   Past Medical History:  Diagnosis Date   Diabetes mellitus without complication ([place])    Hypertension     Past Surgical History:  Procedure Laterality Date   COLONOSCOPY WITH PROPOFOL N/A 04/02/2021   Procedure: COLONOSCOPY WITH PROPOFOL;  Surgeon: Virgel Manifold, MD;  Location: [facility] ENDOSCOPY;  Service: Endoscopy;  Laterality: N/A;   DISTAL BICEPS TENDON REPAIR Right 03/31/2015   Procedure: DISTAL BICEPS TENDON REPAIR;  Surgeon: Corky Mull, MD;  Location: [facility] ORS;  Service: Orthopedics;  Laterality: Right;   ELBOW SURGERY Right 1975   LEG SURGERY Right 1971    Prior to Admission medications   Medication Sig Start Date End Date Taking? Authorizing Provider  aspirin 325 MG tablet Take 325 mg by mouth daily.   Yes [provider]  fluticasone (FLONASE) 50 MCG/ACT nasal spray Place into both nostrils. 10/04/21  Yes [provider]  blood glucose meter kit and supplies Use glucose meter to test blood sugar once in the morning after fasting and 2 hours after largest meal of the day 09/22/22   Simmons-Robinson, Makiera, MD  chlorhexidine (PERIDEX) 0.12 % solution SMARTSIG:By Mouth 11/08/21   [provider]  glucose blood test strip Use as instructed to test blood sugar up to twice daily. 09/27/22   Simmons-Robinson, Makiera, MD  insulin glargine (LANTUS SOLOSTAR) 100 UNIT/ML Solostar Pen Inject 12 Units into the skin daily. 09/22/22   Simmons-Robinson, Makiera, MD  Insulin Pen Needle (SURE-FINE PEN NEEDLES) 31G X 8 MM MISC 1 Needle by Does not apply route daily. 09/26/22   Simmons-Robinson, Makiera, MD   lisinopril-hydrochlorothiazide (ZESTORETIC) 10-12.5 MG tablet Take 1 tablet by mouth daily. 09/08/22   Simmons-Robinson, Makiera, MD  metFORMIN (GLUCOPHAGE) 1000 MG tablet TAKE 1 TABLET BY MOUTH EVERY DAY WITH BREAKFAST 09/13/22   Simmons-Robinson, Makiera, MD  oxyCODONE-acetaminophen (PERCOCET/ROXICET) 5-325 MG tablet Take 1 tablet by mouth every 6 (six) hours. 11/08/21   [provider]  tamsulosin (FLOMAX) 0.4 MG CAPS capsule Take 1 capsule (0.4 mg total) by mouth daily. 07/22/22   Simmons-Robinson, Riki Sheer, MD    Allergies as of 09/07/2022   (No Known Allergies)    Family History  Problem Relation Age of Onset   Breast cancer Mother    Cancer Father    Diabetes Father     Social History   Socioeconomic History   Marital status: Married    Spouse name: Not on file   Number of children: Not on file   Years of education: Not on file   Highest education level: Not on file  Occupational History   Not on file  Tobacco Use   Smoking status: Never   Smokeless tobacco: Former    Types: Chew   Tobacco comments:    has been using for 8 years  Vaping Use   Vaping Use: Never used  Substance and Sexual Activity   Alcohol use: No    Alcohol/week: 0.0 standard drinks of alcohol   Drug use: No   Sexual activity: Yes  Other Topics Concern   Not on file  Social History Narrative   Not on file   Social Determinants of  Health   Financial Resource Strain: Not on file  Food Insecurity: Not on file  Transportation Needs: Not on file  Physical Activity: Not on file  Stress: Not on file  Social Connections: Not on file  Intimate Partner Violence: Not on file    Review of Systems: See HPI, otherwise negative ROS  Physical Exam: BP (!) 153/100   Pulse 68   Temp (!) 96.7 F (35.9 C) (Temporal)   Resp 16   Ht 6\' 4"  (1.93 m)   Wt 125.6 kg   SpO2 99%   BMI 33.72 kg/m  General:   Alert,  pleasant and cooperative in NAD Head:  Normocephalic and atraumatic. Neck:   Supple; no masses or thyromegaly. Lungs:  Clear throughout to auscultation.    Heart:  Regular rate and rhythm. Abdomen:  Soft, nontender and nondistended. Normal bowel sounds, without guarding, and without rebound.   Neurologic:  Alert and  oriented x4;  grossly normal neurologically.  Impression/Plan: Charles Griffith [name] is here for an colonoscopy to be performed for colon cancer screening  Risks, benefits, limitations, and alternatives regarding  colonoscopy have been reviewed with the patient.  Questions have been answered.  All parties agreeable.   Sherri Sear, MD  10/07/2022, 9:14 AM

## 2022-10-07 NOTE — Op Note (Signed)
[facility] Gastroenterology Patient Name: Charles [name] Procedure Date: 10/07/2022 10:08 AM MRN: [id number] Account #: [id number] Date of Birth: 1965-10-31 Admit Type: Outpatient Age: 57 Room: [facility] ENDO ROOM 2 Gender: Male Note Status: Finalized Instrument Name: Colonoscope [id number] Procedure:             Colonoscopy Indications:           Screening for colorectal malignant neoplasm, Last                         colonoscopy: July 2022 Providers:             Lin Landsman MD, MD Referring MD:          Stark Klein (Referring MD) Medicines:             General Anesthesia Complications:         No immediate complications. Estimated blood loss: None. Procedure:             Pre-Anesthesia Assessment:                        - Prior to the procedure, a History and Physical was                         performed, and patient medications and allergies were                         reviewed. The patient is competent. The risks and                         benefits of the procedure and the sedation options and                         risks were discussed with the patient. All questions                         were answered and informed consent was obtained.                         Patient identification and proposed procedure were                         verified by the physician, the nurse, the                         anesthesiologist, the anesthetist and the technician                         in the pre-procedure area in the procedure room in the                         endoscopy suite. Mental Status Examination: alert and                         oriented. Airway Examination: normal oropharyngeal                         airway and neck mobility. Respiratory Examination:  clear to auscultation. CV Examination: normal.                         Prophylactic Antibiotics: The patient does not require                         prophylactic antibiotics.  Prior Anticoagulants: The                         patient has taken no anticoagulant or antiplatelet                         agents. ASA Grade Assessment: II - A patient with mild                         systemic disease. After reviewing the risks and                         benefits, the patient was deemed in satisfactory                         condition to undergo the procedure. The anesthesia                         plan was to use general anesthesia. Immediately prior                         to administration of medications, the patient was                         re-assessed for adequacy to receive sedatives. The                         heart rate, respiratory rate, oxygen saturations,                         blood pressure, adequacy of pulmonary ventilation, and                         response to care were monitored throughout the                         procedure. The physical status of the patient was                         re-assessed after the procedure.                        After obtaining informed consent, the colonoscope was                         passed under direct vision. Throughout the procedure,                         the patient's blood pressure, pulse, and oxygen                         saturations were monitored continuously. The  Colonoscope was introduced through the anus and                         advanced to the the cecum, identified by appendiceal                         orifice and ileocecal valve. The colonoscopy was                         extremely difficult due to significant looping and the                         patient's body habitus. Successful completion of the                         procedure was aided by applying abdominal pressure.                         The patient tolerated the procedure well. The quality                         of the bowel preparation was evaluated using the BBPS                         [facility]  Bowel Preparation Scale) with scores of: Right                         Colon = 2 (minor amount of residual staining, small                         fragments of stool and/or opaque liquid, but mucosa                         seen well), Transverse Colon = 2 (minor amount of                         residual staining, small fragments of stool and/or                         opaque liquid, but mucosa seen well) and Left Colon =                         2 (minor amount of residual staining, small fragments                         of stool and/or opaque liquid, but mucosa seen well).                         The total BBPS score equals 6. The ileocecal valve,                         appendiceal orifice, and rectum were photographed. Findings:      The perianal and digital rectal examinations were normal. Pertinent       negatives include normal sphincter tone and no palpable rectal lesions.      The entire examined colon appeared normal.  The retroflexed view of the distal rectum and anal verge was normal and       showed no anal or rectal abnormalities. Impression:            - The entire examined colon is normal.                        - The distal rectum and anal verge are normal on                         retroflexion view.                        - No specimens collected. Recommendation:        - Discharge patient to home (with escort).                        - Resume previous diet today.                        - Continue present medications.                        - Repeat colonoscopy in 5 years for screening purposes. Procedure Code(s):     --- Professional ---                        Z6109, Colorectal cancer screening; colonoscopy on                         individual not meeting criteria for high risk Diagnosis Code(s):     --- Professional ---                        Z12.11, Encounter for screening for malignant neoplasm                         of colon CPT copyright 2022 American  Medical Association. All rights reserved. The codes documented in this report are preliminary and upon coder review may  be revised to meet current compliance requirements. Dr. Ulyess Mort Lin Landsman MD, MD 10/07/2022 10:33:47 AM This report has been signed electronically. Number of Addenda: 0 Note Initiated On: 10/07/2022 10:08 AM Scope Withdrawal Time: 0 hours 7 minutes 16 seconds  Total Procedure Duration: 0 hours 20 minutes 6 seconds  Estimated Blood Loss:  Estimated blood loss: none.      [facility]

## 2022-10-07 NOTE — Transfer of Care (Signed)
Immediate Anesthesia Transfer of Care Note  Patient: Charles CURCI [name]  Procedure(s) Performed: COLONOSCOPY WITH PROPOFOL  Patient Location: Endoscopy Unit  Anesthesia Type:General  Level of Consciousness: drowsy  Airway & Oxygen Therapy: Patient Spontanous Breathing  Post-op Assessment: Report given to RN and Post -op Vital signs reviewed and stable  Post vital signs: Reviewed and stable  Last Vitals:  Vitals Value Taken Time  BP 136/87 10/07/22 1035  Temp 35.9 C 10/07/22 1035  Pulse 78 10/07/22 1035  Resp 20 10/07/22 1035  SpO2 96 % 10/07/22 1035    Last Pain:  Vitals:   10/07/22 1035  TempSrc: Temporal  PainSc: Asleep         Complications: No notable events documented.

## 2022-10-07 NOTE — Anesthesia Preprocedure Evaluation (Signed)
Anesthesia Evaluation  Patient identified by MRN, date of birth, ID band Patient awake    History of Anesthesia Complications Negative for: history of anesthetic complications  Airway Mallampati: II  TM Distance: >3 FB Neck ROM: Full    Dental  (+) Dental Advidsory Given   Pulmonary neg pulmonary ROS   Pulmonary exam normal - rhonchi (-) wheezing      Cardiovascular Exercise Tolerance: Good hypertension, (-) angina (-) Past MI and (-) Cardiac Stents Normal cardiovascular exam(-) dysrhythmias (-) Valvular Problems/Murmurs Rhythm:Regular Rate:Normal - Systolic murmurs and - Diastolic murmurs 12/22/3788 TTE: INTERPRETATION  NORMAL LEFT VENTRICULAR SYSTOLIC FUNCTION WITH AN ESTIMATED EF = > 60 %  NORMAL RIGHT VENTRICULAR SYSTOLIC FUNCTION  MILD MITRAL VALVE INSUFFICIENCY  TRACE TRICUSPID VALVE INSUFFICIENCY  NO VALVULAR STENOSIS  MILD LVH   03/31/2015 ECG: NSR RBBB T wave abnormality   Neuro/Psych negative neurological ROS  negative psych ROS   GI/Hepatic negative GI ROS, Neg liver ROS,,,  Endo/Other  diabetes, Well Controlled    Renal/GU    Prostatitis    Musculoskeletal   Abdominal Normal abdominal exam  (+)   Peds negative pediatric ROS (+)  Hematology negative hematology ROS (+)   Anesthesia Other Findings Past Medical History: No date: Diabetes mellitus without complication (HCC) No date: Hypertension   Reproductive/Obstetrics                             Anesthesia Physical Anesthesia Plan  ASA: 2  Anesthesia Plan: General   Post-op Pain Management:    Induction: Intravenous  PONV Risk Score and Plan: 0 and Propofol infusion and TIVA  Airway Management Planned: Natural Airway and Nasal Cannula  Additional Equipment:   Intra-op Plan:   Post-operative Plan:   Informed Consent: I have reviewed the patients History and Physical, chart, labs and discussed the  procedure including the risks, benefits and alternatives for the proposed anesthesia with the patient or authorized representative who has indicated his/her understanding and acceptance.     Dental advisory given  Plan Discussed with: CRNA and Anesthesiologist  Anesthesia Plan Comments: (IVGA, PIV x 1, ASA SM, RBO discussed, Consent signed.)        Anesthesia Quick Evaluation

## 2022-10-10 ENCOUNTER — Encounter: Payer: Self-pay | Admitting: Gastroenterology

## 2022-10-24 NOTE — Progress Notes (Unsigned)
      Established patient visit   Patient: Charles BARQUERO [name]   DOB: 10-Aug-1966   57 y.o. Male  MRN: [id number] Visit Date: 10/25/2022  Today's healthcare provider: [name], MD   No chief complaint on file.  Subjective    HPI  Diabetes Mellitus Type II, Follow-up  Lab Results  Component Value Date   HGBA1C 12.0 (H) 09/06/2022   HGBA1C 9.7 (H) 07/08/2021   HGBA1C 8.1 (A) 11/01/2019   Wt Readings from Last 3 Encounters:  10/07/22 277 lb (125.6 kg)  09/22/22 276 lb 9.6 oz (125.5 kg)  09/06/22 275 lb 12.8 oz (125.1 kg)   Last seen for diabetes 1 months ago.  Management since then includes add lantus 12 units daily to metformin 103m twice daily  . He reports {excellent/good/fair/poor:19665} compliance with treatment. He {is/is not:21021397} having side effects. {document side effects if present:1} Symptoms: {Yes/No:20286} fatigue {Yes/No:20286} foot ulcerations  {Yes/No:20286} appetite changes {Yes/No:20286} nausea  {Yes/No:20286} paresthesia of the feet  {Yes/No:20286} polydipsia  {Yes/No:20286} polyuria {Yes/No:20286} visual disturbances   {Yes/No:20286} vomiting     Home blood sugar records: {diabetes glucometry results:16657}  Episodes of hypoglycemia? {Yes/No:20286} {enter symptoms and frequency of symptoms if yes:1}   Current insulin regiment: Lantus 12 units daily   Pertinent Labs: Lab Results  Component Value Date   CHOL 212 (H) 09/06/2022   HDL 40 09/06/2022   LDLCALC 151 (H) 09/06/2022   TRIG 115 09/06/2022   Lab Results  Component Value Date   NA 138 09/06/2022   K 4.0 09/06/2022   CREATININE 1.05 09/06/2022   EGFR 82 09/06/2022   LABMICR 44.2 09/06/2022   MICRALBCREAT 26 09/06/2022     ---------------------------------------------------------------------------------------------------   Medications: Outpatient Medications Prior to Visit  Medication Sig  . aspirin 325 MG tablet Take 325 mg by mouth daily.  . blood glucose  meter kit and supplies Use glucose meter to test blood sugar once in the morning after fasting and 2 hours after largest meal of the day  . chlorhexidine (PERIDEX) 0.12 % solution SMARTSIG:By Mouth  . fluticasone (FLONASE) 50 MCG/ACT nasal spray Place into both nostrils.  .Marland Kitchenglucose blood test strip Use as instructed to test blood sugar up to twice daily.  . insulin glargine (LANTUS SOLOSTAR) 100 UNIT/ML Solostar Pen Inject 12 Units into the skin daily.  . Insulin Pen Needle (SURE-FINE PEN NEEDLES) 31G X 8 MM MISC 1 Needle by Does not apply route daily.  .Marland Kitchenlisinopril-hydrochlorothiazide (ZESTORETIC) 10-12.5 MG tablet Take 1 tablet by mouth daily.  . metFORMIN (GLUCOPHAGE) 1000 MG tablet TAKE 1 TABLET BY MOUTH EVERY DAY WITH BREAKFAST  . oxyCODONE-acetaminophen (PERCOCET/ROXICET) 5-325 MG tablet Take 1 tablet by mouth every 6 (six) hours.  . tamsulosin (FLOMAX) 0.4 MG CAPS capsule Take 1 capsule (0.4 mg total) by mouth daily.   No facility-administered medications prior to visit.    Review of Systems  {Labs  Heme  Chem  Endocrine  Serology  Results Review (optional):23779}   Objective    There were no vitals taken for this visit. {Show previous vital signs (optional):23777}  Physical Exam  ***  No results found for any visits on 10/25/22.  Assessment & Plan     ***  No follow-ups on file.      {provider attestation***:1}   MEulis Foster MD  CEd[facility] [phone](phone) [phone](fax)  [place]

## 2022-10-25 ENCOUNTER — Other Ambulatory Visit: Payer: Self-pay | Admitting: Family Medicine

## 2022-10-25 ENCOUNTER — Ambulatory Visit: Payer: 59 | Admitting: Family Medicine

## 2022-10-25 DIAGNOSIS — N410 Acute prostatitis: Secondary | ICD-10-CM

## 2022-10-27 NOTE — Anesthesia Postprocedure Evaluation (Signed)
Anesthesia Post Note  Patient: Charles KOPE [name]  Procedure(s) Performed: COLONOSCOPY WITH PROPOFOL  Patient location during evaluation: Endoscopy Anesthesia Type: General Level of consciousness: awake and alert Pain management: pain level controlled Vital Signs Assessment: post-procedure vital signs reviewed and stable Respiratory status: spontaneous breathing, nonlabored ventilation, respiratory function stable and patient connected to nasal cannula oxygen Cardiovascular status: blood pressure returned to baseline and stable Postop Assessment: no apparent nausea or vomiting Anesthetic complications: no   No notable events documented.   Last Vitals:  Vitals:   10/07/22 1040 10/07/22 1053  BP: 127/88   Pulse: 76 76  Resp: 16   Temp:    SpO2: 100% 98%    Last Pain:  Vitals:   10/08/22 1142  TempSrc:   PainSc: 0-No pain                 Martha Clan

## 2022-11-01 ENCOUNTER — Encounter: Payer: Self-pay | Admitting: Family Medicine

## 2022-11-01 ENCOUNTER — Ambulatory Visit (INDEPENDENT_AMBULATORY_CARE_PROVIDER_SITE_OTHER): Payer: 59 | Admitting: Family Medicine

## 2022-11-01 VITALS — BP 128/83 | HR 92 | Temp 98.6°F | Resp 16 | Wt 275.2 lb

## 2022-11-01 DIAGNOSIS — E785 Hyperlipidemia, unspecified: Secondary | ICD-10-CM | POA: Diagnosis not present

## 2022-11-01 DIAGNOSIS — Z794 Long term (current) use of insulin: Secondary | ICD-10-CM | POA: Diagnosis not present

## 2022-11-01 DIAGNOSIS — E1169 Type 2 diabetes mellitus with other specified complication: Secondary | ICD-10-CM

## 2022-11-01 DIAGNOSIS — E1165 Type 2 diabetes mellitus with hyperglycemia: Secondary | ICD-10-CM

## 2022-11-01 MED ORDER — ATORVASTATIN CALCIUM 10 MG PO TABS: 10.0000 mg | ORAL_TABLET | Freq: Every day | ORAL | 1 refills | Status: DC

## 2022-11-01 NOTE — Patient Instructions (Signed)
Today we will start a medication to help with lowering your cholesterol levels.   High Cholesterol  High cholesterol is a condition in which the blood has high levels of a white, waxy substance similar to fat (cholesterol). The liver makes all the cholesterol that the body needs. The human body needs small amounts of cholesterol to help build cells. A person gets extra or excess cholesterol from the food that he or she eats. The blood carries cholesterol from the liver to the rest of the body. If you have high cholesterol, deposits (plaques) may build up on the walls of your arteries. Arteries are the blood vessels that carry blood away from your heart. These plaques make the arteries narrow and stiff. Cholesterol plaques increase your risk for heart attack and stroke. Work with your health care provider to keep your cholesterol levels in a healthy range. What increases the risk? The following factors may make you more likely to develop this condition: Eating foods that are high in animal fat (saturated fat) or cholesterol. Being overweight. Not getting enough exercise. A family history of high cholesterol (familial hypercholesterolemia). Use of tobacco products. Having diabetes. What are the signs or symptoms? In most cases, high cholesterol does not usually cause any symptoms. In severe cases, very high cholesterol levels can cause: Fatty bumps under the skin (xanthomas). A white or gray ring around the black center (pupil) of the eye. How is this diagnosed? This condition may be diagnosed based on the results of a blood test. If you are older than 59 years of age, your health care provider may check your cholesterol levels every 4-6 years. You may be checked more often if you have high cholesterol or other risk factors for heart disease. The blood test for cholesterol measures: "Bad" cholesterol, or LDL cholesterol. This is the main type of cholesterol that causes heart disease. The  desired level is less than 100 mg/dL (2.59 mmol/L). "Good" cholesterol, or HDL cholesterol. HDL helps protect against heart disease by cleaning the arteries and carrying the LDL to the liver for processing. The desired level for HDL is 60 mg/dL (1.55 mmol/L) or higher. Triglycerides. These are fats that your body can store or burn for energy. The desired level is less than 150 mg/dL (1.69 mmol/L). Total cholesterol. This measures the total amount of cholesterol in your blood and includes LDL, HDL, and triglycerides. The desired level is less than 200 mg/dL (5.17 mmol/L). How is this treated? Treatment for high cholesterol starts with lifestyle changes, such as diet and exercise. Diet changes. You may be asked to eat foods that have more fiber and less saturated fats or added sugar. Lifestyle changes. These may include regular exercise, maintaining a healthy weight, and quitting use of tobacco products. Medicines. These are given when diet and lifestyle changes have not worked. You may be prescribed a statin medicine to help lower your cholesterol levels. Follow these instructions at home: Eating and drinking  Eat a healthy, balanced diet. This diet includes: Daily servings of a variety of fresh, frozen, or canned fruits and vegetables. Daily servings of whole grain foods that are rich in fiber. Foods that are low in saturated fats and trans fats. These include poultry and fish without skin, lean cuts of meat, and low-fat dairy products. A variety of fish, especially oily fish that contain omega-3 fatty acids. Aim to eat fish at least 2 times a week. Avoid foods and drinks that have added sugar. Use healthy cooking methods, such as  roasting, grilling, broiling, baking, poaching, steaming, and stir-frying. Do not fry your food except for stir-frying. If you drink alcohol: Limit how much you have to: 0-1 drink a day for women who are not pregnant. 0-2 drinks a day for men. Know how much alcohol  is in a drink. In the [place] one drink equals one 12 oz bottle of beer (355 mL), one 5 oz glass of wine (148 mL), or one 1 oz glass of hard liquor (44 mL). Lifestyle  Get regular exercise. Aim to exercise for a total of 150 minutes a week. Increase your activity level by doing activities such as gardening, walking, and taking the stairs. Do not use any products that contain nicotine or tobacco. These products include cigarettes, chewing tobacco, and vaping devices, such as e-cigarettes. If you need help quitting, ask your health care provider. General instructions Take over-the-counter and prescription medicines only as told by your health care provider. Keep all follow-up visits. This is important. Where to find more information American Heart Association: www.heart.org National Heart, Lung, and Blood Institute: https://wilson-eaton.com/ Contact a health care provider if: You have trouble achieving or maintaining a healthy diet or weight. You are starting an exercise program. You are unable to stop smoking. Get help right away if: You have chest pain. You have trouble breathing. You have discomfort or pain in your jaw, neck, back, shoulder, or arm. You have any symptoms of a stroke. "BE FAST" is an easy way to remember the main warning signs of a stroke: B - Balance. Signs are dizziness, sudden trouble walking, or loss of balance. E - Eyes. Signs are trouble seeing or a sudden change in vision. F - Face. Signs are sudden weakness or numbness of the face, or the face or eyelid drooping on one side. A - Arms. Signs are weakness or numbness in an arm. This happens suddenly and usually on one side of the body. S - Speech. Signs are sudden trouble speaking, slurred speech, or trouble understanding what people say. T - Time. Time to call emergency services. Write down what time symptoms started. You have other signs of a stroke, such as: A sudden, severe headache with no known cause. Nausea or  vomiting. Seizure. These symptoms may represent a serious problem that is an emergency. Do not wait to see if the symptoms will go away. Get medical help right away. Call your local emergency services (911 in the [place] Do not drive yourself to the hospital. Summary Cholesterol plaques increase your risk for heart attack and stroke. Work with your health care provider to keep your cholesterol levels in a healthy range. Eat a healthy, balanced diet, get regular exercise, and maintain a healthy weight. Do not use any products that contain nicotine or tobacco. These products include cigarettes, chewing tobacco, and vaping devices, such as e-cigarettes. Get help right away if you have any symptoms of a stroke. This information is not intended to replace advice given to you by your health care provider. Make sure you discuss any questions you have with your health care provider. Document Revised: 03/25/2022 Document Reviewed: 10/26/2020 Elsevier Patient Education  [employer].   Atorvastatin Tablets What is this medication? ATORVASTATIN (a TORE va sta tin) treats high cholesterol and reduces the risk of heart attack and stroke. It works by decreasing bad cholesterol and fats (such as LDL, triglycerides) and increasing good cholesterol (HDL) in your blood. It belongs to a group of medications called statins. Changes to diet and exercise  are often combined with this medication. This medicine may be used for other purposes; ask your health care provider or pharmacist if you have questions. COMMON BRAND NAME(S): Lipitor What should I tell my care team before I take this medication? They need to know if you have any of these conditions: Diabetes Frequently drink alcohol Kidney disease Liver disease Muscle cramps, pain Stroke Thyroid disease An unusual or allergic reaction to atorvastatin, other medications, foods, dyes, or preservatives Pregnant or trying to get pregnant Breastfeeding How  should I use this medication? Take this medication by mouth. Take it as directed on the label at the same time every day. You can take it with or without food. If it upsets your stomach, take it with food. Keep taking it unless your care team tells you to stop. Do not take this medication with grapefruit juice. Talk to your care team about the use of this medication in children. While it may be prescribed for children as young as 10 years for selected conditions, precautions do apply. Overdosage: If you think you have taken too much of this medicine contact a poison control center or emergency room at once. NOTE: This medicine is only for you. Do not share this medicine with others. What if I miss a dose? If you miss a dose, take it as soon as you can unless it is more than 12 hours late. If it is more than 12 hours late, skip the missed dose. Take the next dose at the normal time. If it is almost time for your next dose, take only that dose. Do not take double or extra doses. What may interact with this medication? Do not take this medication with any of the following: Dasabuvir; ombitasvir; paritaprevir; ritonavir Lonafarnib Ombitasvir; paritaprevir; ritonavir Posaconazole Red yeast rice This medication may also interact with the following: Alcohol Certain antibiotics, such as erythromycin, clarithromycin Certain antivirals for HIV or hepatitis Certain medications for cholesterol, such as fenofibrate, gemfibrozil, niacin Certain medications for fungal infections, such as ketoconazole, itraconazole, voriconazole Colchicine Cyclosporine Digoxin Estrogen or progestin hormones Grapefruit juice Rifampin This list may not describe all possible interactions. Give your health care provider a list of all the medicines, herbs, non-prescription drugs, or dietary supplements you use. Also tell them if you smoke, drink alcohol, or use illegal drugs. Some items may interact with your medicine. What  should I watch for while using this medication? Visit your care team for regular checks on your progress. Tell your care team if your symptoms do not start to get better or if they get worse. Your care team may tell you to stop taking this medication if you develop muscle problems. If your muscle problems do not go away after stopping this medication, contact your care team. This medication may increase blood sugar. The risk may be higher in patients who already have diabetes. Ask your care team what you can do to lower your risk of diabetes while on this medication. If you are going to need surgery or other procedure, tell your care team that you are using this medication. Taking this medication is only part of a total heart healthy program. Ask your care team if there are other changes you can make to improve your overall health. Drinking more than 2 alcoholic drinks every day with this medication can increase the risk of side effects. Talk to your care team if you wish to become pregnant or think you might be pregnant. This medication can cause serious birth defects.  Talk to your care team before breastfeeding. Changes to your treatment plan may be needed. What side effects may I notice from receiving this medication? Side effects that you should report to your care team as soon as possible: Allergic reactions--skin rash, itching, hives, swelling of the face, lips, tongue, or throat High blood sugar (hyperglycemia)--increased thirst or amount of urine, unusual weakness or fatigue, blurry vision Liver injury--right upper belly pain, loss of appetite, nausea, light-colored stool, dark yellow or brown urine, yellowing skin or eyes, unusual weakness or fatigue Muscle injury--unusual weakness or fatigue, muscle pain, dark yellow or brown urine, decrease in amount of urine Redness, blistering, peeling, or loosening of the skin, including inside the mouth Side effects that usually do not require medical  attention (report to your care team if they continue or are bothersome): Diarrhea Nausea Trouble sleeping Upset stomach This list may not describe all possible side effects. Call your doctor for medical advice about side effects. You may report side effects to FDA at 1-800-FDA-1088. Where should I keep my medication? Keep out of the reach of children and pets. Store at room temperature between 20 and 25 degrees C (68 and 77 degrees F). Get rid of any unused medication after the expiration date. To get rid of medications that are no longer needed or have expired: Take the medication to a medication take-back program. Check with your pharmacy or law enforcement to find a location. If you cannot return the medication, check the label or package insert to see if the medication should be thrown out in the garbage or flushed down the toilet. If you are not sure, ask your care team. If it is safe to put it in the trash, take the medication out of the container. Mix the medication with cat litter, dirt, coffee grounds, or other unwanted substance. Seal the mixture in a bag or container. Put it in the trash. NOTE: This sheet is a summary. It may not cover all possible information. If you have questions about this medicine, talk to your doctor, pharmacist, or health care provider.  2023 Elsevier/Gold Standard (2007-10-13 00:00:00)

## 2022-11-01 NOTE — Progress Notes (Signed)
I,Joseline E Rosas,acting as a scribe for [employer], MD.,have documented all relevant documentation on the behalf of [name], MD,as directed by  [name], MD while in the presence of [name], MD.   Established patient visit   Patient: Charles RUDNIK [name]   DOB: 10/18/1965   57 y.o. Male  MRN: [id number] Visit Date: 11/01/2022  Today's healthcare provider: [name], MD   Chief Complaint  Patient presents with   follow-up DM   Subjective    HPI  Diabetes Mellitus Type II, Follow-up  Lab Results  Component Value Date   HGBA1C 12.0 (H) 09/06/2022   HGBA1C 9.7 (H) 07/08/2021   HGBA1C 8.1 (A) 11/01/2019   Wt Readings from Last 3 Encounters:  11/01/22 275 lb 3.2 oz (124.8 kg)  10/07/22 277 lb (125.6 kg)  09/22/22 276 lb 9.6 oz (125.5 kg)   Last seen for diabetes 1 months ago.  Management since then includes add lantus 12 units daily to metformin '1000mg'$  twice daily  CBG checks regimen recommended fasting in AM and after largest meal, 2 hours PP recommended. Patient reports that he is only doing '1000mg'$  daily.  He reports good compliance with treatment. He is not having side effects.   Symptoms: No fatigue No foot ulcerations  No appetite changes No nausea  No paresthesia of the feet  No polydipsia  No polyuria No visual disturbances   No vomiting     Home blood sugar records: fasting range: 100-103  after eating after lunch is 107-110 dinner is around 110-115.  Episodes of hypoglycemia? No     Pertinent Labs: Lab Results  Component Value Date   CHOL 212 (H) 09/06/2022   HDL 40 09/06/2022   LDLCALC 151 (H) 09/06/2022   TRIG 115 09/06/2022   Lab Results  Component Value Date   NA 138 09/06/2022   K 4.0 09/06/2022   CREATININE 1.05 09/06/2022   EGFR 82 09/06/2022   LABMICR 44.2 09/06/2022   MICRALBCREAT 26 09/06/2022      ---------------------------------------------------------------------------------------------------  HLD: reviewed elevated LDL and ASCVD score  The 10-year ASCVD risk score (Arnett DK, et al., 2019) is: 24% Recommended that goal for LDL would be less than 55 given his hx of diabetes   Medications: Outpatient Medications Prior to Visit  Medication Sig   aspirin 325 MG tablet Take 325 mg by mouth daily.   blood glucose meter kit and supplies Use glucose meter to test blood sugar once in the morning after fasting and 2 hours after largest meal of the day   chlorhexidine (PERIDEX) 0.12 % solution SMARTSIG:By Mouth   fluticasone (FLONASE) 50 MCG/ACT nasal spray Place into both nostrils.   glucose blood test strip Use as instructed to test blood sugar up to twice daily.   insulin glargine (LANTUS SOLOSTAR) 100 UNIT/ML Solostar Pen Inject 12 Units into the skin daily.   Insulin Pen Needle (SURE-FINE PEN NEEDLES) 31G X 8 MM MISC 1 Needle by Does not apply route daily.   lisinopril-hydrochlorothiazide (ZESTORETIC) 10-12.5 MG tablet Take 1 tablet by mouth daily.   metFORMIN (GLUCOPHAGE) 1000 MG tablet TAKE 1 TABLET BY MOUTH EVERY DAY WITH BREAKFAST   oxyCODONE-acetaminophen (PERCOCET/ROXICET) 5-325 MG tablet Take 1 tablet by mouth every 6 (six) hours.   tamsulosin (FLOMAX) 0.4 MG CAPS capsule TAKE 1 CAPSULE BY MOUTH EVERY DAY   No facility-administered medications prior to visit.    Review of Systems     Objective    BP  128/83 (BP Location: Left Arm, Patient Position: Sitting, Cuff Size: Large) Comment: Hasn't taken BP meds  Pulse 92   Temp 98.6 F (37 C) (Oral)   Resp 16   Wt 275 lb 3.2 oz (124.8 kg)   BMI 33.50 kg/m    Physical Exam Vitals reviewed.  Constitutional:      General: He is not in acute distress.    Appearance: Normal appearance. He is not ill-appearing, toxic-appearing or diaphoretic.  Eyes:     Conjunctiva/sclera: Conjunctivae normal.  Cardiovascular:     Rate  and Rhythm: Normal rate and regular rhythm.     Pulses: Normal pulses.     Heart sounds: Normal heart sounds. No murmur heard.    No friction rub. No gallop.  Pulmonary:     Effort: Pulmonary effort is normal. No respiratory distress.     Breath sounds: Normal breath sounds. No stridor. No wheezing, rhonchi or rales.  Abdominal:     General: Bowel sounds are normal. There is no distension.     Palpations: Abdomen is soft.     Tenderness: There is no abdominal tenderness.  Musculoskeletal:     Right lower leg: No edema.     Left lower leg: No edema.  Skin:    Findings: No erythema or rash.  Neurological:     Mental Status: He is alert and oriented to person, place, and time.      No results found for any visits on 11/01/22.  Assessment & Plan     Problem List Items Addressed This Visit       Endocrine   Diabetes mellitus ([place]) - Primary    Chronic  Last A1c was not at goal  DM foot exam was completed today  Counseled him to continue metformin '1000mg'$  twice daily instead of one time daily  He will continue to monitor fasting and PP glucose measurements as discussed above  He will continue to use 12 units of long acting insulin daily  No medication changes today  F/u for next A1c in 3 months  On ACE/ARB  Added statin today       Relevant Medications   atorvastatin (LIPITOR) 10 MG tablet   Hyperlipidemia associated with type 2 diabetes mellitus ([place])    The 10-year ASCVD risk score (Arnett DK, et al., 2019) is: 24% Discussed dietary recommendations and recommended that patient continue regular physical activity  Will add atorvastatin '10mg'$  daily  Follow up in 2 months for repeat lipid panel and assess medication tolerance  Counseled patient to monitor for development of myopathies or RUQ pain       Relevant Medications   atorvastatin (LIPITOR) 10 MG tablet     Return in about 6 weeks (around 12/13/2022) for diabetes f/u .        The entirety of the information  documented in the History of Present Illness, Review of Systems and Physical Exam were personally obtained by me. Portions of this information were initially documented by [employer], CMAstatin . I, [name], MD have reviewed the documentation above for thoroughness and accuracy.      [name], MD  [facility] [phone] (phone) [phone] (fax)  [place]

## 2022-11-01 NOTE — Assessment & Plan Note (Signed)
The 10-year ASCVD risk score (Arnett DK, et al., 2019) is: 24% Discussed dietary recommendations and recommended that patient continue regular physical activity  Will add atorvastatin '10mg'$  daily  Follow up in 2 months for repeat lipid panel and assess medication tolerance  Counseled patient to monitor for development of myopathies or RUQ pain

## 2022-11-01 NOTE — Assessment & Plan Note (Signed)
Chronic  Last A1c was not at goal  DM foot exam was completed today  Counseled him to continue metformin '1000mg'$  twice daily instead of one time daily  He will continue to monitor fasting and PP glucose measurements as discussed above  He will continue to use 12 units of long acting insulin daily  No medication changes today  F/u for next A1c in 3 months  On ACE/ARB  Added statin today

## 2022-11-14 ENCOUNTER — Other Ambulatory Visit: Payer: Self-pay | Admitting: Family Medicine

## 2022-11-14 DIAGNOSIS — E1169 Type 2 diabetes mellitus with other specified complication: Secondary | ICD-10-CM

## 2022-11-14 MED ORDER — METFORMIN HCL 1000 MG PO TABS: 1000.0000 mg | ORAL_TABLET | Freq: Two times a day (BID) | ORAL | 1 refills | Status: DC

## 2022-11-14 NOTE — Telephone Encounter (Signed)
Have updated pharmacy and dosage on Metformin. Please review and sing pended prescriptions. Thanks

## 2022-11-14 NOTE — Telephone Encounter (Signed)
Medication Refill - Medication:  atorvastatin (LIPITOR) 10 MG tablet *Medication was sent to [facility] on 2/27 but [facility]* metFORMIN (GLUCOPHAGE) 1000 MG tablet *Dosage was increased to 2x daily but never changed on rx*  Has the patient contacted their pharmacy? No. (Agent: If no, request that the patient contact the pharmacy for the refill. If patient does not wish to contact the pharmacy document the reason why and proceed with request.) (Agent: If yes, when and what did the pharmacy advise?)  Preferred Pharmacy (with phone number or street name):  [id number] [address]: [phone] Fax: [phone]    Has the patient been seen for an appointment in the last year OR does the patient have an upcoming appointment? Yes.    Agent: Please be advised that RX refills may take up to 3 business days. We ask that you follow-up with your pharmacy.'

## 2022-11-22 ENCOUNTER — Encounter: Payer: Self-pay | Admitting: Family Medicine

## 2022-11-22 ENCOUNTER — Ambulatory Visit: Payer: Self-pay | Admitting: *Deleted

## 2022-11-22 ENCOUNTER — Ambulatory Visit (INDEPENDENT_AMBULATORY_CARE_PROVIDER_SITE_OTHER): Payer: 59 | Admitting: Family Medicine

## 2022-11-22 VITALS — BP 106/83 | HR 114 | Temp 98.4°F | Resp 16 | Ht 74.0 in | Wt 267.2 lb

## 2022-11-22 DIAGNOSIS — L03115 Cellulitis of right lower limb: Secondary | ICD-10-CM | POA: Diagnosis not present

## 2022-11-22 MED ORDER — SULFAMETHOXAZOLE-TRIMETHOPRIM 800-160 MG PO TABS: 1.0000 | ORAL_TABLET | Freq: Two times a day (BID) | ORAL | 0 refills | Status: DC

## 2022-11-22 NOTE — Assessment & Plan Note (Signed)
Acute, self limiting Recommend ABX use given tachycardia and initial BP elevation Continue to recommend RICE and moisturizer  Work note provided RTC following treatment for re-check; reach out if need a sooner appt Reviewed emergent care indications

## 2022-11-22 NOTE — Telephone Encounter (Signed)
  Chief Complaint: redness/swelling R ankle Symptoms: redness/swelling in ankle - patient concerned about cellulitis Frequency: started Sunday  Disposition: [] ED /[] Urgent Care (no appt availability in office) / [x] Appointment(In office/virtual)/ []  [facility]/ [] Home Care/ [] Refused Recommended Disposition /[] [facility]/ []  Follow-up with PCP Additional Notes: Patient has been scheduled for appointment today for evaluation of ankle.

## 2022-11-22 NOTE — Telephone Encounter (Signed)
Summary: swelling in the ankles and right foot/pain around the waist line and intense soreness; kidney stones   Pt stated on Sunday started with lower back pain around the waist line and intense soreness; kidney stones possibly moved. Pt mentioned running a high fever Sunday as well and has swelling in the ankles and right foot. He stated that when he puts his foot down from keeping it elevated, he immediately feels his foot swelling up.  Pt seeking clinical advice.  Pt only wants to see PCP and wants to be seen today no appointments.         Reason for Disposition  [1] Redness AND [2] painful when touched AND [3] no fever  Answer Assessment - Initial Assessment Questions 1. LOCATION: "Which ankle is swollen?" "Where is the swelling?"     Right- patient reports cellulitis in ankle- he needs follow up 2. ONSET: "When did the swelling start?"     Sunday 3. SWELLING: "How bad is the swelling?" Or, "How large is it?" (e.g., mild, moderate, severe; size of localized swelling)    - NONE: No joint swelling.   - LOCALIZED: Localized; small area of puffy or swollen skin (e.g., insect bite, skin irritation).   - MILD: Joint looks or feels mildly swollen or puffy.   - MODERATE: Swollen; interferes with normal activities (e.g., work or school); decreased range of movement; may be limping.   - SEVERE: Very swollen; can't move swollen joint at all; limping a lot or unable to walk.     moderate 4. PAIN: "Is there any pain?" If Yes, ask: "How bad is it?" (Scale 1-10; or mild, moderate, severe)   - NONE (0): no pain.   - MILD (1-3): doesn't interfere with normal activities.    - MODERATE (4-7): interferes with normal activities (e.g., work or school) or awakens from sleep, limping.    - SEVERE (8-10): excruciating pain, unable to do any normal activities, unable to walk.     Moderate/severe 5. CAUSE: "What do you think caused the ankle swelling?"      cellutis 6. OTHER SYMPTOMS: "Do you have any  other symptoms?" (e.g., fever, chest pain, difficulty breathing, calf pain)     fever  Protocols used: Ankle Swelling-A-AH

## 2022-11-22 NOTE — Progress Notes (Signed)
I,J'ya E Hunter,acting as a scribe for [name], FNP.,have documented all relevant documentation on the behalf of [name], FNP,as directed by  [name], FNP while in the presence of [name], FNP.   Established patient visit   Patient: Charles TRAUTNER [name]   DOB: 1966-02-20   57 y.o. Male  MRN: [id number] Visit Date: 11/22/2022  Today's healthcare provider: [name], FNP  Introduced to nurse practitioner role and practice setting.  All questions answered.  Discussed provider/patient relationship and expectations.   Chief Complaint  Patient presents with   Edema   Subjective    HPI  Edema: Patient complains of edema. The location of the edema is right ankle and top of right foot. The edema has been severe and localized.  Onset of symptoms was 2 days ago, gradually worsening since that time. The edema is present all day. The patient states the problem is long-standing.  The swelling has been aggravated by dependency of involved area, relieved by support stockings, elevation of involved area, and been associated with nothing. Cardiac risk factors include advanced age (older than 29 for men, 49 for women) and diabetes mellitus.  Patient reports that he was dx with cellulitis over 5 years ago. Patient states that Sunday he experienced back pain and "felt kidney stone move", felt cold, and was running a fever (103.5 F), patient noticed swelling later in the night along with redness.  At home treatment includes elevation and tylenol which has helped with swelling.   Medications: Outpatient Medications Prior to Visit  Medication Sig   aspirin 325 MG tablet Take 325 mg by mouth daily.   atorvastatin (LIPITOR) 10 MG tablet Take 1 tablet (10 mg total) by mouth daily.   blood glucose meter kit and supplies Use glucose meter to test blood sugar once in the morning after fasting and 2 hours after largest meal of the day   chlorhexidine (PERIDEX) 0.12 % solution SMARTSIG:By  Mouth   fluticasone (FLONASE) 50 MCG/ACT nasal spray Place into both nostrils.   glucose blood test strip Use as instructed to test blood sugar up to twice daily.   insulin glargine (LANTUS SOLOSTAR) 100 UNIT/ML Solostar Pen Inject 12 Units into the skin daily.   Insulin Pen Needle (SURE-FINE PEN NEEDLES) 31G X 8 MM MISC 1 Needle by Does not apply route daily.   lisinopril-hydrochlorothiazide (ZESTORETIC) 10-12.5 MG tablet Take 1 tablet by mouth daily.   metFORMIN (GLUCOPHAGE) 1000 MG tablet Take 1 tablet (1,000 mg total) by mouth 2 (two) times daily with a meal.   oxyCODONE-acetaminophen (PERCOCET/ROXICET) 5-325 MG tablet Take 1 tablet by mouth every 6 (six) hours.   tamsulosin (FLOMAX) 0.4 MG CAPS capsule TAKE 1 CAPSULE BY MOUTH EVERY DAY   No facility-administered medications prior to visit.    Review of Systems  Musculoskeletal:  Positive for back pain, gait problem and joint swelling.  Skin:  Positive for color change.  Neurological:  Negative for light-headedness and headaches.     Objective    BP 106/83 (BP Location: Right Arm, Patient Position: Sitting, Cuff Size: Large)   Pulse (!) 114   Temp 98.4 F (36.9 C) (Oral)   Resp 16   Ht 6\' 2"  (1.88 m)   Wt 267 lb 3.2 oz (121.2 kg)   SpO2 100%   BMI 34.31 kg/m   Physical Exam Vitals and nursing note reviewed.  Constitutional:      Appearance: Normal appearance. He  is obese.  HENT:     Head: Normocephalic and atraumatic.  Eyes:     Pupils: Pupils are equal, round, and reactive to light.  Cardiovascular:     Rate and Rhythm: Regular rhythm. Tachycardia present.     Pulses: Normal pulses.     Heart sounds: Normal heart sounds.  Pulmonary:     Effort: Pulmonary effort is normal.     Breath sounds: Normal breath sounds.  Musculoskeletal:        General: Normal range of motion.     Cervical back: Normal range of motion.     Right lower leg: Edema present.  Skin:    General: Skin is warm and dry.     Capillary Refill:  Capillary refill takes less than 2 seconds.     Findings: Erythema present.  Neurological:     General: No focal deficit present.     Mental Status: He is alert and oriented to person, place, and time. Mental status is at baseline.  Psychiatric:        Mood and Affect: Mood normal.        Behavior: Behavior normal.        Thought Content: Thought content normal.        Judgment: Judgment normal.     No results found for any visits on 11/22/22.  Assessment & Plan     Problem List Items Addressed This Visit       Other   Cellulitis of right lower extremity - Primary    Acute, self limiting Recommend ABX use given tachycardia and initial BP elevation Continue to recommend RICE and moisturizer  Work note provided RTC following treatment for re-check; reach out if need a sooner appt Reviewed emergent care indications      Relevant Medications   sulfamethoxazole-trimethoprim (BACTRIM DS) 800-160 MG tablet      I, [name], FNP, have reviewed all documentation for this visit. The documentation on 11/22/22 for the exam, diagnosis, procedures, and orders are all accurate and complete.  [name], [place] [phone] (phone) [phone] (fax)  [place]

## 2022-11-25 ENCOUNTER — Ambulatory Visit (INDEPENDENT_AMBULATORY_CARE_PROVIDER_SITE_OTHER): Payer: 59 | Admitting: Family Medicine

## 2022-11-25 ENCOUNTER — Encounter: Payer: Self-pay | Admitting: Family Medicine

## 2022-11-25 ENCOUNTER — Ambulatory Visit
Admission: RE | Admit: 2022-11-25 | Discharge: 2022-11-25 | Disposition: A | Discharge status: Home / Self Care | Payer: 59 | Source: Ambulatory Visit | Attending: Family Medicine | Admitting: Family Medicine

## 2022-11-25 ENCOUNTER — Ambulatory Visit
Admission: RE | Admit: 2022-11-25 | Discharge: 2022-11-25 | Disposition: A | Discharge status: Home / Self Care | Payer: 59 | Attending: Family Medicine | Admitting: Family Medicine

## 2022-11-25 VITALS — BP 120/84 | HR 108 | Resp 18 | Wt 261.7 lb

## 2022-11-25 DIAGNOSIS — M545 Low back pain, unspecified: Secondary | ICD-10-CM

## 2022-11-25 LAB — POCT URINALYSIS DIPSTICK
Bilirubin, UA: NEGATIVE
Blood, UA: NEGATIVE
Glucose, UA: NEGATIVE
Ketones, UA: NEGATIVE
Leukocytes, UA: NEGATIVE
Nitrite, UA: NEGATIVE
Protein, UA: NEGATIVE
Spec Grav, UA: 1.02 (ref 1.010–1.025)
Urobilinogen, UA: 0.2 E.U./dL
pH, UA: 6 (ref 5.0–8.0)

## 2022-11-25 MED ORDER — TRAMADOL HCL 50 MG PO TABS: 50.0000 mg | ORAL_TABLET | Freq: Three times a day (TID) | ORAL | 0 refills | Status: AC | PRN

## 2022-11-25 MED ORDER — TIZANIDINE HCL 4 MG PO TABS: 4.0000 mg | ORAL_TABLET | Freq: Four times a day (QID) | ORAL | 0 refills | Status: DC | PRN

## 2022-11-25 NOTE — Patient Instructions (Addendum)
Your urine sample today was normal and did not show signs of infection nor blood that would be expected with kidney stone   I will send medication for a muscle relaxer and some medication to take for pain.   I will also order an xray of your back:   Please report to [facility] located at:  [place], [place] do not need an appointment to have xrays completed.   Our office will follow up with  results once available.   I would like for you to continue trying to stay active so that you don't have stiffening as you recover.   Please seek care emergently if pain worsens, you develop inability to walk or have trouble controlling your bowels or bladder   Please follow up with me in 2 weeks for back pain

## 2022-11-25 NOTE — Progress Notes (Signed)
I,Joseline E Rosas,acting as a scribe for [employer], MD.,have documented all relevant documentation on the behalf of Ronnald Ramp, MD,as directed by  Ronnald Ramp, MD while in the presence of Ronnald Ramp, MD.   Established patient visit   Patient: Charles FRONHEISER [name]   DOB: 1966-06-09   57 y.o. Male  MRN: [id number] Visit Date: 11/25/2022  Today's healthcare provider: Ronnald Ramp, MD   Chief Complaint  Patient presents with   Back Pain   Subjective    HPI  Back Pain: Patient presents for presents evaluation of low back problems.  Symptoms have been present for several days (Sunday)and include pain in low back (sharp in character; 10/10 in severity). Initial inciting event: none. Symptoms are worst: morning, mid-day, afternoon, evening, nighttime. Alleviating factors identifiable by patient are  Tylenol has been helping, but not today . Exacerbating factors identifiable by patient are sitting, standing, walking, and with any movement . Treatments so far initiated by patient:  Tylenol 1000mg   Previous lower back problems: none. Previous workup: none. Previous treatments: none. Patient with history of kidney stone. Reports no injury.  Medications: Outpatient Medications Prior to Visit  Medication Sig   aspirin 325 MG tablet Take 325 mg by mouth daily.   atorvastatin (LIPITOR) 10 MG tablet Take 1 tablet (10 mg total) by mouth daily.   blood glucose meter kit and supplies Use glucose meter to test blood sugar once in the morning after fasting and 2 hours after largest meal of the day   chlorhexidine (PERIDEX) 0.12 % solution SMARTSIG:By Mouth   fluticasone (FLONASE) 50 MCG/ACT nasal spray Place into both nostrils.   glucose blood test strip Use as instructed to test blood sugar up to twice daily.   insulin glargine (LANTUS SOLOSTAR) 100 UNIT/ML Solostar Pen Inject 12 Units into the skin daily.   Insulin Pen Needle  (SURE-FINE PEN NEEDLES) 31G X 8 MM MISC 1 Needle by Does not apply route daily.   lisinopril-hydrochlorothiazide (ZESTORETIC) 10-12.5 MG tablet Take 1 tablet by mouth daily.   metFORMIN (GLUCOPHAGE) 1000 MG tablet Take 1 tablet (1,000 mg total) by mouth 2 (two) times daily with a meal.   oxyCODONE-acetaminophen (PERCOCET/ROXICET) 5-325 MG tablet Take 1 tablet by mouth every 6 (six) hours.   sulfamethoxazole-trimethoprim (BACTRIM DS) 800-160 MG tablet Take 1 tablet by mouth 2 (two) times daily.   tamsulosin (FLOMAX) 0.4 MG CAPS capsule TAKE 1 CAPSULE BY MOUTH EVERY DAY   No facility-administered medications prior to visit.    Review of Systems     Objective    BP 120/84 (BP Location: Left Arm, Patient Position: Sitting, Cuff Size: Normal)   Pulse (!) 108   Resp 18   Wt 261 lb 11.2 oz (118.7 kg)   BMI 33.60 kg/m    Physical Exam Constitutional:      Comments: Patient is uncomfortable appearing  Pulmonary:     Effort: Pulmonary effort is normal. No respiratory distress.  Musculoskeletal:     Comments: Back: No gross deformity, scoliosis. TTP bilateral lumbar paraspinal muscles and into left and right inferior flanks ROM limited 2/2 to tenderness, patient sits in partially flexed position and leans toward the right side, stands with spine in flexed position, leans forward       Results for orders placed or performed in visit on 11/25/22  POCT urinalysis dipstick  Result Value Ref Range   Color, UA Yellow    Clarity, UA Clear    Glucose, UA Negative  Negative   Bilirubin, UA Negative    Ketones, UA Negative    Spec Grav, UA 1.020 1.010 - 1.025   Blood, UA Negative    pH, UA 6.0 5.0 - 8.0   Protein, UA Negative Negative   Urobilinogen, UA 0.2 0.2 or 1.0 E.U./dL   Nitrite, UA Negative    Leukocytes, UA Negative Negative   Appearance     Odor      Assessment & Plan     Problem List Items Addressed This Visit       Other   Acute bilateral low back pain - Primary     Acute problem  Pain worsening over the course of 5 days Pain is not well controlled on oral analgesics  No red flag symptos such as urinary/bowel incontinence nor leg weakness or saddle anesthesia  Will prescribe tizanidine 4mg  every 6 hours PRN for 7 days and tramadol 50mg  every 8 hours PRN for pain for 5 days  Patient was recently treated for cellulitis of the foot, and with acute development of back pain will order xray of lumbar spine as well  U/A completed in office, negative for UTI at this time       Relevant Medications   tiZANidine (ZANAFLEX) 4 MG tablet   traMADol (ULTRAM) 50 MG tablet   Other Relevant Orders   POCT urinalysis dipstick (Completed)   DG Lumbar Spine Complete     Return in about 4 days (around 11/29/2022), or if symptoms worsen or fail to improve.        The entirety of the information documented in the History of Present Illness, Review of Systems and Physical Exam were personally obtained by me. Portions of this information were initially documented by Hetty Ely, CMA . I, Ronnald Ramp, MD have reviewed the documentation above for thoroughness and accuracy.      Ronnald Ramp, MD  [facility] [phone] (phone) [phone] (fax)  [facility]

## 2022-11-26 NOTE — Assessment & Plan Note (Signed)
Acute problem  Pain worsening over the course of 5 days Pain is not well controlled on oral analgesics  No red flag symptos such as urinary/bowel incontinence nor leg weakness or saddle anesthesia  Will prescribe tizanidine 4mg  every 6 hours PRN for 7 days and tramadol 50mg  every 8 hours PRN for pain for 5 days  Patient was recently treated for cellulitis of the foot, and with acute development of back pain will order xray of lumbar spine as well  U/A completed in office, negative for UTI at this time

## 2022-11-28 ENCOUNTER — Telehealth: Payer: Self-pay

## 2022-11-28 NOTE — Telephone Encounter (Signed)
Note written to return to work today written  Please instruct patient to pick this document up from front desk.   [name], MD  [facility]

## 2022-11-28 NOTE — Telephone Encounter (Signed)
Copied from [place] [id number]. Topic: General - Other >> Nov 28, 2022  9:30 AM Ludger Nutting wrote: Patient states that he has a note taking him out of work until 3/27. Patient states that he is feeling better and would like to return to work today. Patient is needing an updated note. Please advise.

## 2022-11-28 NOTE — Telephone Encounter (Signed)
Patient advised.

## 2022-11-30 ENCOUNTER — Telehealth: Payer: Self-pay

## 2022-11-30 DIAGNOSIS — M545 Low back pain, unspecified: Secondary | ICD-10-CM

## 2022-11-30 NOTE — Telephone Encounter (Signed)
Patient reports that he had sent this message before he read the message that had been sent in to his mychart. Patient would like referral to the orthopedic.

## 2022-11-30 NOTE — Telephone Encounter (Signed)
Copied from [place] [id number]. Topic: General - Other >> Nov 30, 2022 11:07 AM Charles [name] wrote: Reason for CRM: The patient would like to be contacted by a member of clinical staff to review their recent imaging results  Please contact when possible

## 2022-12-02 NOTE — Progress Notes (Unsigned)
I,Charles [name],acting as a Education administrator for Charles Sprout, FNP.,have documented all relevant documentation on the behalf of Charles Sprout, FNP,as directed by  Charles Sprout, FNP while in the presence of Charles Sprout, FNP.   Established patient visit  Patient: Charles BRESTER [name]   DOB: 1966/08/24   57 y.o. Male  MRN: [id number] Visit Date: 12/05/2022  Today's healthcare provider: [name], FNP  Introduced to nurse practitioner role and practice setting.  All questions answered.  Discussed provider/patient relationship and expectations.  Subjective    HPI  Follow up for Cellulitis of right lower extremity   The patient was last seen for this 13 days ago. Changes made at last visit include sulfamethoxazole-trimethoprim (BACTRIM DS) 800-160 MG tablet.  He reports excellent compliance with treatment. He feels that condition is Improved. He is not having side effects.  ---------------------------------------------------------------------------------------------------  Medications: Outpatient Medications Prior to Visit  Medication Sig   aspirin 325 MG tablet Take 325 mg by mouth daily.   atorvastatin (LIPITOR) 10 MG tablet Take 1 tablet (10 mg total) by mouth daily.   blood glucose meter kit and supplies Use glucose meter to test blood sugar once in the morning after fasting and 2 hours after largest meal of the day   chlorhexidine (PERIDEX) 0.12 % solution SMARTSIG:By Mouth   fluticasone (FLONASE) 50 MCG/ACT nasal spray Place into both nostrils.   glucose blood test strip Use as instructed to test blood sugar up to twice daily.   insulin glargine (LANTUS SOLOSTAR) 100 UNIT/ML Solostar Pen Inject 12 Units into the skin daily.   Insulin Pen Needle (SURE-FINE PEN NEEDLES) 31G X 8 MM MISC 1 Needle by Does not apply route daily.   lisinopril-hydrochlorothiazide (ZESTORETIC) 10-12.5 MG tablet Take 1 tablet by mouth daily.   metFORMIN (GLUCOPHAGE) 1000 MG tablet Take 1 tablet (1,000 mg  total) by mouth 2 (two) times daily with a meal.   oxyCODONE-acetaminophen (PERCOCET/ROXICET) 5-325 MG tablet Take 1 tablet by mouth every 6 (six) hours.   tamsulosin (FLOMAX) 0.4 MG CAPS capsule TAKE 1 CAPSULE BY MOUTH EVERY DAY   [DISCONTINUED] sulfamethoxazole-trimethoprim (BACTRIM DS) 800-160 MG tablet Take 1 tablet by mouth 2 (two) times daily.   [DISCONTINUED] tiZANidine (ZANAFLEX) 4 MG tablet Take 1 tablet (4 mg total) by mouth every 6 (six) hours as needed for up to 7 days for muscle spasms.   No facility-administered medications prior to visit.    Review of Systems    Objective    BP 122/77 (BP Location: Right Arm, Patient Position: Sitting, Cuff Size: Large)   Pulse 99   Ht 6\' 4"  (1.93 m)   Wt 269 lb 6.4 oz (122.2 kg)   SpO2 98%   BMI 32.79 kg/m   Physical Exam Vitals and nursing note reviewed.  Constitutional:      Appearance: Normal appearance. He is obese.  HENT:     Head: Normocephalic and atraumatic.  Eyes:     Pupils: Pupils are equal, round, and reactive to light.  Cardiovascular:     Rate and Rhythm: Normal rate and regular rhythm.     Pulses: Normal pulses.     Heart sounds: Normal heart sounds.  Pulmonary:     Effort: Pulmonary effort is normal.     Breath sounds: Normal breath sounds.  Musculoskeletal:        General: Normal range of motion.     Cervical back: Normal range of motion.     Right  lower leg: Edema present.  Skin:    General: Skin is warm and dry.     Capillary Refill: Capillary refill takes less than 2 seconds.     Findings: Erythema present.  Neurological:     General: No focal deficit present.     Mental Status: He is alert and oriented to person, place, and time. Mental status is at baseline.  Psychiatric:        Mood and Affect: Mood normal.        Behavior: Behavior normal.        Thought Content: Thought content normal.        Judgment: Judgment normal.     Results for orders placed or performed in visit on 12/05/22  POCT  HgB A1C  Result Value Ref Range   Hemoglobin A1C 10.7 (A) 4.0 - 5.6 %   HbA1c POC (<> result, manual entry)     HbA1c, POC (prediabetic range)     HbA1c, POC (controlled diabetic range)      Assessment & Plan     Problem List Items Addressed This Visit       Endocrine   Diabetes mellitus    Chronic, improving A1c goal of <7% Continue to recommend balanced, lower carb meals. Smaller meal size, adding snacks. Choosing water as drink of choice and increasing purposeful exercise. Encourage to keep next appt with PCP to assist with medication adjustments      Relevant Orders   POCT HgB A1C (Completed)     Other   Acute bilateral low back pain    Chronic, variable Continue to recommend 1000 mg TID PRN to assist with zanaflex PRN and stretching      Relevant Medications   tiZANidine (ZANAFLEX) 4 MG tablet   Cellulitis of right lower extremity - Primary    Acute, improving however, not resolved Change ABX to assist with treatment Continue to moisturize and recommend elevation to assist Prioritize water.       No follow-ups on file.     Charles Kotyk, FNP, have reviewed all documentation for this visit. The documentation on 12/05/22 for the exam, diagnosis, procedures, and orders are all accurate and complete.  Charles [name], [place] [phone] (phone) [phone] (fax)  [place]

## 2022-12-05 ENCOUNTER — Ambulatory Visit (INDEPENDENT_AMBULATORY_CARE_PROVIDER_SITE_OTHER): Payer: 59 | Admitting: Family Medicine

## 2022-12-05 ENCOUNTER — Encounter: Payer: Self-pay | Admitting: Family Medicine

## 2022-12-05 VITALS — BP 122/77 | HR 99 | Ht 76.0 in | Wt 269.4 lb

## 2022-12-05 DIAGNOSIS — L03115 Cellulitis of right lower limb: Secondary | ICD-10-CM

## 2022-12-05 DIAGNOSIS — E1165 Type 2 diabetes mellitus with hyperglycemia: Secondary | ICD-10-CM

## 2022-12-05 DIAGNOSIS — Z794 Long term (current) use of insulin: Secondary | ICD-10-CM | POA: Diagnosis not present

## 2022-12-05 DIAGNOSIS — M545 Low back pain, unspecified: Secondary | ICD-10-CM | POA: Diagnosis not present

## 2022-12-05 LAB — POCT GLYCOSYLATED HEMOGLOBIN (HGB A1C): Hemoglobin A1C: 10.7 % — AB (ref 4.0–5.6)

## 2022-12-05 MED ORDER — DOXYCYCLINE HYCLATE 100 MG PO TABS: 100.0000 mg | ORAL_TABLET | Freq: Two times a day (BID) | ORAL | 0 refills | Status: DC

## 2022-12-05 MED ORDER — TIZANIDINE HCL 4 MG PO TABS: 4.0000 mg | ORAL_TABLET | Freq: Four times a day (QID) | ORAL | 0 refills | Status: AC | PRN

## 2022-12-05 NOTE — Assessment & Plan Note (Signed)
Acute, improving however, not resolved Change ABX to assist with treatment Continue to moisturize and recommend elevation to assist Prioritize water.

## 2022-12-05 NOTE — Assessment & Plan Note (Signed)
Chronic, variable Continue to recommend 1000 mg TID PRN to assist with zanaflex PRN and stretching

## 2022-12-05 NOTE — Assessment & Plan Note (Signed)
Chronic, improving A1c goal of <7% Continue to recommend balanced, lower carb meals. Smaller meal size, adding snacks. Choosing water as drink of choice and increasing purposeful exercise. Encourage to keep next appt with PCP to assist with medication adjustments

## 2022-12-17 ENCOUNTER — Other Ambulatory Visit: Payer: Self-pay | Admitting: Family Medicine

## 2022-12-17 DIAGNOSIS — E1169 Type 2 diabetes mellitus with other specified complication: Secondary | ICD-10-CM

## 2022-12-27 ENCOUNTER — Ambulatory Visit: Payer: 59 | Admitting: Family Medicine

## 2022-12-28 NOTE — Progress Notes (Deleted)
Established patient visit   Patient: Charles ARIAIL [name]   DOB: 1966-03-02   57 y.o. Male  MRN: [id number] Visit Date: 12/29/2022  Today's healthcare provider: Ronnald Ramp, MD   No chief complaint on file.  Subjective    HPI  Diabetes Mellitus Type II, Follow-up  Lab Results  Component Value Date   HGBA1C 10.7 (A) 12/05/2022   HGBA1C 12.0 (H) 09/06/2022   HGBA1C 9.7 (H) 07/08/2021   Wt Readings from Last 3 Encounters:  12/05/22 269 lb 6.4 oz (122.2 kg)  11/25/22 261 lb 11.2 oz (118.7 kg)  11/22/22 267 lb 3.2 oz (121.2 kg)   Last seen for diabetes 3 weeks ago.  Management since then includes continue metformin 1000mg  twice daily instead of one time daily,continue to use 12 units of long acting insulin daily, A1c was repeated and improved from 12 to 10. He reports {excellent/good/fair/poor:19665} compliance with treatment. He {is/is not:21021397} having side effects. {document side effects if present:1} Symptoms: {Yes/No:20286} fatigue {Yes/No:20286} foot ulcerations  {Yes/No:20286} appetite changes {Yes/No:20286} nausea  {Yes/No:20286} paresthesia of the feet  {Yes/No:20286} polydipsia  {Yes/No:20286} polyuria {Yes/No:20286} visual disturbances   {Yes/No:20286} vomiting     Home blood sugar records: {diabetes glucometry results:16657}  Episodes of hypoglycemia? {Yes/No:20286} {enter symptoms and frequency of symptoms if yes:1}   Current insulin regiment: 12 units of glargine daily  Most Recent Eye Exam: *** {Current exercise:16438:::1} {Current diet habits:16563:::1}  Pertinent Labs: Lab Results  Component Value Date   CHOL 212 (H) 09/06/2022   HDL 40 09/06/2022   LDLCALC 151 (H) 09/06/2022   TRIG 115 09/06/2022   Lab Results  Component Value Date   NA 138 09/06/2022   K 4.0 09/06/2022   CREATININE 1.05 09/06/2022   EGFR 82 09/06/2022   LABMICR 44.2 09/06/2022   MICRALBCREAT 26 09/06/2022      ---------------------------------------------------------------------------------------------------   Lipid/Cholesterol, Follow-up  Last lipid panel Other pertinent labs  Lab Results  Component Value Date   CHOL 212 (H) 09/06/2022   HDL 40 09/06/2022   LDLCALC 151 (H) 09/06/2022   TRIG 115 09/06/2022   Lab Results  Component Value Date   ALT 48 (H) 09/06/2022   AST 30 09/06/2022   PLT 244 11/18/2021   TSH 1.590 07/08/2021     He was last seen for this 3 months ago.  Management since that visit includes start atorvastatin 10mg  daily.   He reports {excellent/good/fair/poor:19665} compliance with treatment. He {is/is not:9024} having side effects. {document side effects if present:1}  Symptoms: {Yes/No:20286} chest pain {Yes/No:20286} chest pressure/discomfort  {Yes/No:20286} dyspnea {Yes/No:20286} lower extremity edema  {Yes/No:20286} numbness or tingling of extremity {Yes/No:20286} orthopnea  {Yes/No:20286} palpitations {Yes/No:20286} paroxysmal nocturnal dyspnea  {Yes/No:20286} speech difficulty {Yes/No:20286} syncope   Current diet: {diet habits:16563} Current exercise: {exercise types:16438}  The 10-year ASCVD risk score (Arnett DK, et al., 2019) is: 23%  ---------------------------------------------------------------------------------------------------  Medications: Outpatient Medications Prior to Visit  Medication Sig   aspirin 325 MG tablet Take 325 mg by mouth daily.   atorvastatin (LIPITOR) 10 MG tablet Take 1 tablet (10 mg total) by mouth daily.   blood glucose meter kit and supplies Use glucose meter to test blood sugar once in the morning after fasting and 2 hours after largest meal of the day   chlorhexidine (PERIDEX) 0.12 % solution SMARTSIG:By Mouth   doxycycline (VIBRA-TABS) 100 MG tablet Take 1 tablet (100 mg total) by mouth 2 (two) times daily.   fluticasone (FLONASE) 50 MCG/ACT nasal spray Place  into both nostrils.   glucose blood test strip Use  as instructed to test blood sugar up to twice daily.   insulin glargine (LANTUS SOLOSTAR) 100 UNIT/ML Solostar Pen Inject 12 Units into the skin daily.   Insulin Pen Needle (SURE-FINE PEN NEEDLES) 31G X 8 MM MISC 1 Needle by Does not apply route daily.   lisinopril-hydrochlorothiazide (ZESTORETIC) 10-12.5 MG tablet Take 1 tablet by mouth daily.   metFORMIN (GLUCOPHAGE) 1000 MG tablet TAKE 1 TABLET BY MOUTH EVERY DAY WITH BREAKFAST   oxyCODONE-acetaminophen (PERCOCET/ROXICET) 5-325 MG tablet Take 1 tablet by mouth every 6 (six) hours.   tamsulosin (FLOMAX) 0.4 MG CAPS capsule TAKE 1 CAPSULE BY MOUTH EVERY DAY   No facility-administered medications prior to visit.    Review of Systems  {Labs  Heme  Chem  Endocrine  Serology  Results Review (optional):23779}   Objective    There were no vitals taken for this visit. {Show previous vital signs (optional):23777}  Physical Exam  ***  No results found for any visits on 12/29/22.  Assessment & Plan     Problem List Items Addressed This Visit       Endocrine   Diabetes mellitus - Primary     No follow-ups on file.        The entirety of the information documented in the History of Present Illness, Review of Systems and Physical Exam were personally obtained by me. Portions of this information were initially documented by Hetty Ely, CMA . I, Ronnald Ramp, MD have reviewed the documentation above for thoroughness and accuracy.      Ronnald Ramp, MD  [facility] [phone] (phone) [phone] (fax)  [facility]

## 2022-12-29 ENCOUNTER — Ambulatory Visit: Payer: 59 | Admitting: Family Medicine

## 2022-12-29 DIAGNOSIS — E1169 Type 2 diabetes mellitus with other specified complication: Secondary | ICD-10-CM

## 2022-12-30 NOTE — Progress Notes (Signed)
I,Joseline E Rosas,acting as a scribe for [employer], MD.,have documented all relevant documentation on the behalf of Ronnald Ramp, MD,as directed by  Ronnald Ramp, MD while in the presence of Ronnald Ramp, MD.   Established patient visit   Patient: Charles CARLONE [name]   DOB: 1966-05-30   57 y.o. Male  MRN: [id number] Visit Date: 01/02/2023  Today's healthcare provider: Ronnald Ramp, MD   Chief Complaint  Patient presents with   Follow-up   Subjective    HPI Patient reports here to follow-up Cellulitis. Reports that he finished the antibiotic. Right leg is better. Diabetes Mellitus Type II, Follow-up  Lab Results  Component Value Date   HGBA1C 10.7 (A) 12/05/2022   HGBA1C 12.0 (H) 09/06/2022   HGBA1C 9.7 (H) 07/08/2021   Wt Readings from Last 3 Encounters:  01/02/23 280 lb 3.2 oz (127.1 kg)  12/05/22 269 lb 6.4 oz (122.2 kg)  11/25/22 261 lb 11.2 oz (118.7 kg)   Last seen for diabetes 3 weeks ago.  Management since then includes continue metformin 1000mg  twice daily instead of one time daily,continue to use 12 units of long acting insulin daily, A1c was repeated and improved from 12 to 10. He reports excellent compliance with treatment. He is not having side effects.  Symptoms: No fatigue No foot ulcerations  No appetite changes No nausea  No paresthesia of the feet  No polydipsia  No polyuria No visual disturbances   No vomiting     Home blood sugar records:  105-110 fasting, lunch time 115-120 Dinner time 125-130  Episodes of hypoglycemia? No    Current insulin regiment: 12 units of glargine daily   Pertinent Labs: Lab Results  Component Value Date   CHOL 212 (H) 09/06/2022   HDL 40 09/06/2022   LDLCALC 151 (H) 09/06/2022   TRIG 115 09/06/2022   Lab Results  Component Value Date   NA 138 09/06/2022   K 4.0 09/06/2022   CREATININE 1.05 09/06/2022   EGFR 82 09/06/2022   LABMICR 44.2  09/06/2022   MICRALBCREAT 26 09/06/2022      Lipid/Cholesterol, Follow-up  Last lipid panel Other pertinent labs  Lab Results  Component Value Date   CHOL 212 (H) 09/06/2022   HDL 40 09/06/2022   LDLCALC 151 (H) 09/06/2022   TRIG 115 09/06/2022   Lab Results  Component Value Date   ALT 48 (H) 09/06/2022   AST 30 09/06/2022   PLT 244 11/18/2021   TSH 1.590 07/08/2021     He was last seen for this 3 months ago.  Management since that visit includes start atorvastatin 10mg  daily.  He reports excellent compliance with treatment. He is not having side effects.   The 10-year ASCVD risk score (Arnett DK, et al., 2019) is: 26.9%  ---------------------------------------------------------------------------------------------------  Medications: Outpatient Medications Prior to Visit  Medication Sig   aspirin 325 MG tablet Take 325 mg by mouth daily.   atorvastatin (LIPITOR) 10 MG tablet Take 1 tablet (10 mg total) by mouth daily.   blood glucose meter kit and supplies Use glucose meter to test blood sugar once in the morning after fasting and 2 hours after largest meal of the day   chlorhexidine (PERIDEX) 0.12 % solution SMARTSIG:By Mouth   glucose blood test strip Use as instructed to test blood sugar up to twice daily.   insulin glargine (LANTUS SOLOSTAR) 100 UNIT/ML Solostar Pen Inject 12 Units into the skin daily.   Insulin Pen Needle (SURE-FINE PEN  NEEDLES) 31G X 8 MM MISC 1 Needle by Does not apply route daily.   lisinopril-hydrochlorothiazide (ZESTORETIC) 10-12.5 MG tablet Take 1 tablet by mouth daily.   metFORMIN (GLUCOPHAGE) 1000 MG tablet TAKE 1 TABLET BY MOUTH EVERY DAY WITH BREAKFAST   tamsulosin (FLOMAX) 0.4 MG CAPS capsule TAKE 1 CAPSULE BY MOUTH EVERY DAY   [DISCONTINUED] doxycycline (VIBRA-TABS) 100 MG tablet Take 1 tablet (100 mg total) by mouth 2 (two) times daily.   [DISCONTINUED] fluticasone (FLONASE) 50 MCG/ACT nasal spray Place into both nostrils.    [DISCONTINUED] oxyCODONE-acetaminophen (PERCOCET/ROXICET) 5-325 MG tablet Take 1 tablet by mouth every 6 (six) hours.   No facility-administered medications prior to visit.    Review of Systems     Objective    BP 134/75 (BP Location: Left Arm, Patient Position: Sitting, Cuff Size: Large)   Pulse 89   Temp 98.6 F (37 C) (Oral)   Resp 16   Wt 280 lb 3.2 oz (127.1 kg)   BMI 34.11 kg/m    Physical Exam Vitals reviewed.  Constitutional:      General: He is not in acute distress.    Appearance: Normal appearance. He is not ill-appearing, toxic-appearing or diaphoretic.  Eyes:     Conjunctiva/sclera: Conjunctivae normal.  Cardiovascular:     Rate and Rhythm: Normal rate and regular rhythm.     Pulses: Normal pulses.     Heart sounds: Murmur heard.     No friction rub. No gallop.  Pulmonary:     Effort: Pulmonary effort is normal. No respiratory distress.     Breath sounds: Normal breath sounds. No stridor. No wheezing, rhonchi or rales.  Abdominal:     General: Bowel sounds are normal. There is no distension.     Palpations: Abdomen is soft.     Tenderness: There is no abdominal tenderness.  Musculoskeletal:     Right lower leg: No edema.     Left lower leg: No edema.  Skin:    Findings: No erythema or rash.  Neurological:     Mental Status: He is alert and oriented to person, place, and time.      No results found for any visits on 01/02/23.  Assessment & Plan     Problem List Items Addressed This Visit       Endocrine   Diabetes mellitus (HCC)    Chronic  A1c improved from 12 to 10  Will add ozempic 0.25mg  once weekly, continue metformin 1000mg  twice daily, continue insulin 12 units daily of lantus  Will follow up in 1 month for diabetes med management  Patient given 1 month worth of samples for 0.25mg  ozempic dose       Relevant Medications   Semaglutide,0.25 or 0.5MG /DOS, (OZEMPIC, 0.25 OR 0.5 MG/DOSE,) 2 MG/1.5ML SOPN   Semaglutide,0.25 or  0.5MG /DOS, (OZEMPIC, 0.25 OR 0.5 MG/DOSE,) 2 MG/3ML SOPN (Start on 01/23/2023)   Hyperlipidemia associated with type 2 diabetes mellitus (HCC) - Primary    Chronic Tolerating statin therapy without SE  Will continue lipitor 10mg  daily  Will check lipid panel today and CMP       Relevant Medications   Semaglutide,0.25 or 0.5MG /DOS, (OZEMPIC, 0.25 OR 0.5 MG/DOSE,) 2 MG/1.5ML SOPN   Semaglutide,0.25 or 0.5MG /DOS, (OZEMPIC, 0.25 OR 0.5 MG/DOSE,) 2 MG/3ML SOPN (Start on 01/23/2023)   Other Relevant Orders   Lipid Profile   Comprehensive metabolic panel    Return in about 1 month (around 02/01/2023) for DM medication management .  The entirety of the information documented in the History of Present Illness, Review of Systems and Physical Exam were personally obtained by me. Portions of this information were initially documented by Hetty Ely, CMA . I, Ronnald Ramp, MD have reviewed the documentation above for thoroughness and accuracy.  Ronnald Ramp, MD  [facility] [phone] (phone) [phone] (fax)  [facility]

## 2023-01-02 ENCOUNTER — Other Ambulatory Visit: Payer: Self-pay | Admitting: Family Medicine

## 2023-01-02 ENCOUNTER — Encounter: Payer: Self-pay | Admitting: Family Medicine

## 2023-01-02 ENCOUNTER — Ambulatory Visit (INDEPENDENT_AMBULATORY_CARE_PROVIDER_SITE_OTHER): Payer: 59 | Admitting: Family Medicine

## 2023-01-02 VITALS — BP 134/75 | HR 89 | Temp 98.6°F | Resp 16 | Wt 280.2 lb

## 2023-01-02 DIAGNOSIS — E785 Hyperlipidemia, unspecified: Secondary | ICD-10-CM

## 2023-01-02 DIAGNOSIS — E1169 Type 2 diabetes mellitus with other specified complication: Secondary | ICD-10-CM

## 2023-01-02 MED ORDER — OZEMPIC (0.25 OR 0.5 MG/DOSE) 2 MG/3ML ~~LOC~~ SOPN
0.5000 mg | PEN_INJECTOR | SUBCUTANEOUS | 0 refills | Status: DC
Start: 2023-01-23 — End: 2023-01-05

## 2023-01-02 MED ORDER — OZEMPIC (0.25 OR 0.5 MG/DOSE) 2 MG/1.5ML ~~LOC~~ SOPN: 0.2500 mg | PEN_INJECTOR | SUBCUTANEOUS | 0 refills | Status: DC

## 2023-01-02 MED ORDER — FLUTICASONE PROPIONATE 50 MCG/ACT NA SUSP: 2.0000 | Freq: Every day | NASAL | 6 refills | Status: DC

## 2023-01-02 NOTE — Assessment & Plan Note (Signed)
Chronic  A1c improved from 12 to 10  Will add ozempic 0.25mg  once weekly, continue metformin 1000mg  twice daily, continue insulin 12 units daily of lantus  Will follow up in 1 month for diabetes med management  Patient given 1 month worth of samples for 0.25mg  ozempic dose

## 2023-01-02 NOTE — Patient Instructions (Addendum)
I have provided the samples for you to start 1 month of the ozempic 0.25mg  once weekly.   Please follow up with me in 1 month   Semaglutide Injection What is this medication? SEMAGLUTIDE (SEM a GLOO tide) treats type 2 diabetes. It works by increasing insulin levels in your body, which decreases your blood sugar (glucose). It also reduces the amount of sugar released into the blood and slows down your digestion. It can also be used to lower the risk of heart attack and stroke in people with type 2 diabetes. Changes to diet and exercise are often combined with this medication. This medicine may be used for other purposes; ask your health care provider or pharmacist if you have questions. COMMON BRAND NAME(S): OZEMPIC What should I tell my care team before I take this medication? They need to know if you have any of these conditions: Endocrine tumors (MEN 2) or if someone in your family had these tumors Eye disease, vision problems History of pancreatitis Kidney disease Stomach problems Thyroid cancer or if someone in your family had thyroid cancer An unusual or allergic reaction to semaglutide, other medications, foods, dyes, or preservatives Pregnant or trying to get pregnant Breast-feeding How should I use this medication? This medication is for injection under the skin of your upper leg (thigh), stomach area, or upper arm. It is given once every week (every 7 days). You will be taught how to prepare and give this medication. Use exactly as directed. Take your medication at regular intervals. Do not take it more often than directed. If you use this medication with insulin, you should inject this medication and the insulin separately. Do not mix them together. Do not give the injections right next to each other. Change (rotate) injection sites with each injection. It is important that you put your used needles and syringes in a special sharps container. Do not put them in a trash can. If you  do not have a sharps container, call your pharmacist or care team to get one. A special MedGuide will be given to you by the pharmacist with each prescription and refill. Be sure to read this information carefully each time. This medication comes with INSTRUCTIONS FOR USE. Ask your pharmacist for directions on how to use this medication. Read the information carefully. Talk to your pharmacist or care team if you have questions. Talk to your care team about the use of this medication in children. Special care may be needed. Overdosage: If you think you have taken too much of this medicine contact a poison control center or emergency room at once. NOTE: This medicine is only for you. Do not share this medicine with others. What if I miss a dose? If you miss a dose, take it as soon as you can within 5 days after the missed dose. Then take your next dose at your regular weekly time. If it has been longer than 5 days after the missed dose, do not take the missed dose. Take the next dose at your regular time. Do not take double or extra doses. If you have questions about a missed dose, contact your care team for advice. What may interact with this medication? Other medications for diabetes Many medications may cause changes in blood sugar, these include: Alcohol containing beverages Antiviral medications for HIV or AIDS Aspirin and aspirin-like medications Certain medications for blood pressure, heart disease, irregular heart beat Chromium Diuretics Male hormones, such as estrogens or progestins, birth control pills Fenofibrate Gemfibrozil  Isoniazid Lanreotide Male hormones or anabolic steroids MAOIs like Carbex, Eldepryl, Marplan, Nardil, and Parnate Medications for weight loss Medications for allergies, asthma, cold, or cough Medications for depression, anxiety, or psychotic disturbances Niacin Nicotine NSAIDs, medications for pain and inflammation, like ibuprofen or  naproxen Octreotide Pasireotide Pentamidine Phenytoin Probenecid Quinolone antibiotics such as ciprofloxacin, levofloxacin, ofloxacin Some herbal dietary supplements Steroid medications such as prednisone or cortisone Sulfamethoxazole; trimethoprim Thyroid hormones Some medications can hide the warning symptoms of low blood sugar (hypoglycemia). You may need to monitor your blood sugar more closely if you are taking one of these medications. These include: Beta-blockers, often used for high blood pressure or heart problems (examples include atenolol, metoprolol, propranolol) Clonidine Guanethidine Reserpine This list may not describe all possible interactions. Give your health care provider a list of all the medicines, herbs, non-prescription drugs, or dietary supplements you use. Also tell them if you smoke, drink alcohol, or use illegal drugs. Some items may interact with your medicine. What should I watch for while using this medication? Visit your care team for regular checks on your progress. Drink plenty of fluids while taking this medication. Check with your care team if you get an attack of severe diarrhea, nausea, and vomiting. The loss of too much body fluid can make it dangerous for you to take this medication. A test called the HbA1C (A1C) will be monitored. This is a simple blood test. It measures your blood sugar control over the last 2 to 3 months. You will receive this test every 3 to 6 months. Learn how to check your blood sugar. Learn the symptoms of low and high blood sugar and how to manage them. Always carry a quick-source of sugar with you in case you have symptoms of low blood sugar. Examples include hard sugar candy or glucose tablets. Make sure others know that you can choke if you eat or drink when you develop serious symptoms of low blood sugar, such as seizures or unconsciousness. They must get medical help at once. Tell your care team if you have high blood sugar.  You might need to change the dose of your medication. If you are sick or exercising more than usual, you might need to change the dose of your medication. Do not skip meals. Ask your care team if you should avoid alcohol. Many nonprescription cough and cold products contain sugar or alcohol. These can affect blood sugar. Pens should never be shared. Even if the needle is changed, sharing may result in passing of viruses like hepatitis or HIV. Wear a medical ID bracelet or chain, and carry a card that describes your disease and details of your medication and dosage times. Do not become pregnant while taking this medication. Women should inform their care team if they wish to become pregnant or think they might be pregnant. There is a potential for serious side effects to an unborn child. Talk to your care team for more information. What side effects may I notice from receiving this medication? Side effects that you should report to your care team as soon as possible: Allergic reactions--skin rash, itching, hives, swelling of the face, lips, tongue, or throat Change in vision Dehydration--increased thirst, dry mouth, feeling faint or lightheaded, headache, dark yellow or brown urine Gallbladder problems--severe stomach pain, nausea, vomiting, fever Heart palpitations--rapid, pounding, or irregular heartbeat Kidney injury--decrease in the amount of urine, swelling of the ankles, hands, or feet Pancreatitis--severe stomach pain that spreads to your back or gets worse after eating  or when touched, fever, nausea, vomiting Thyroid cancer--new mass or lump in the neck, pain or trouble swallowing, trouble breathing, hoarseness Side effects that usually do not require medical attention (report to your care team if they continue or are bothersome): Diarrhea Loss of appetite Nausea Stomach pain Vomiting This list may not describe all possible side effects. Call your doctor for medical advice about side  effects. You may report side effects to FDA at 1-800-FDA-1088. Where should I keep my medication? Keep out of the reach of children. Store unopened pens in a refrigerator between 2 and 8 degrees C (36 and 46 degrees F). Do not freeze. Protect from light and heat. After you first use the pen, it can be stored for 56 days at room temperature between 15 and 30 degrees C (59 and 86 degrees F) or in a refrigerator. Throw away your used pen after 56 days or after the expiration date, whichever comes first. Do not store your pen with the needle attached. If the needle is left on, medication may leak from the pen. NOTE: This sheet is a summary. It may not cover all possible information. If you have questions about this medicine, talk to your doctor, pharmacist, or health care provider.  2023 Elsevier/Gold Standard (2020-11-26 00:00:00)

## 2023-01-02 NOTE — Assessment & Plan Note (Signed)
Chronic Tolerating statin therapy without SE  Will continue lipitor 10mg  daily  Will check lipid panel today and CMP

## 2023-01-03 ENCOUNTER — Other Ambulatory Visit: Payer: Self-pay | Admitting: Family Medicine

## 2023-01-03 LAB — LIPID PANEL
Chol/HDL Ratio: 3.4 ratio (ref 0.0–5.0)
Cholesterol, Total: 134 mg/dL (ref 100–199)
HDL: 39 mg/dL — ABNORMAL LOW (ref >39)
LDL Chol Calc (NIH): 73 mg/dL (ref 0–99)
Triglycerides: 122 mg/dL (ref 0–149)
VLDL Cholesterol Cal: 22 mg/dL (ref 5–40)

## 2023-01-03 LAB — COMPREHENSIVE METABOLIC PANEL
ALT: 30 IU/L (ref 0–44)
AST: 21 IU/L (ref 0–40)
Albumin/Globulin Ratio: 1.5 (ref 1.2–2.2)
Albumin: 4.1 g/dL (ref 3.8–4.9)
Alkaline Phosphatase: 87 IU/L (ref 44–121)
BUN/Creatinine Ratio: 12 (ref 9–20)
BUN: 13 mg/dL (ref 6–24)
Bilirubin Total: 0.3 mg/dL (ref 0.0–1.2)
CO2: 22 mmol/L (ref 20–29)
Calcium: 9.5 mg/dL (ref 8.7–10.2)
Chloride: 100 mmol/L (ref 96–106)
Creatinine, Ser: 1.11 mg/dL (ref 0.76–1.27)
Globulin, Total: 2.7 g/dL (ref 1.5–4.5)
Glucose: 289 mg/dL — ABNORMAL HIGH (ref 70–99)
Potassium: 4.1 mmol/L (ref 3.5–5.2)
Sodium: 139 mmol/L (ref 134–144)
Total Protein: 6.8 g/dL (ref 6.0–8.5)
eGFR: 76 mL/min/{1.73_m2} (ref >59)

## 2023-01-04 NOTE — Telephone Encounter (Signed)
Requested medication (s) are due for refill today: routing for review  Requested medication (s) are on the active medication list: yes  Last refill:  01/02/23  Future visit scheduled: yes  Notes to clinic:  Pharmacy comment: Alternative Requested:THE PRESCRIBED MEDICATION IS NOT COVERED BY INSURANCE. PLEASE CONSIDER CHANGING TO ONE OF THE SUGGESTED COVERED ALTERNATIVES.      Requested Prescriptions  Pending Prescriptions Disp Refills   TRULICITY 0.75 MG/0.5ML SOPN [Pharmacy Med Name: TRULICITY 0.75 MG/0.5 ML PEN]  0     Endocrinology:  Diabetes - GLP-1 Receptor Agonists Failed - 01/03/2023  5:20 PM      Failed - HBA1C is between 0 and 7.9 and within 180 days    Hemoglobin A1C  Date Value Ref Range Status  12/05/2022 10.7 (A) 4.0 - 5.6 % Final   Hgb A1c MFr Bld  Date Value Ref Range Status  09/06/2022 12.0 (H) 4.8 - 5.6 % Final    Comment:             Prediabetes: 5.7 - 6.4          Diabetes: >6.4          Glycemic control for adults with diabetes: <7.0          Passed - Valid encounter within last 6 months    Recent Outpatient Visits           2 days ago Hyperlipidemia associated with type 2 diabetes mellitus (HCC)   [place] [facility] Simmons-Robinson, [place], MD   1 month ago Cellulitis of right lower extremity   [place] [facility] Merita Norton T, FNP   1 month ago Acute bilateral low back pain, unspecified whether sciatica present   [place] [facility] Simmons-Robinson, [place], MD   1 month ago Cellulitis of right lower extremity   [place] [facility] Merita Norton T, FNP   2 months ago Type 2 diabetes mellitus with hyperglycemia, with long-term current use of insulin (HCC)   [place] [facility] Simmons-Robinson, Tawanna Cooler, MD       Future Appointments             In 1 month Simmons-Robinson, Tawanna Cooler, MD [facility]

## 2023-01-09 ENCOUNTER — Other Ambulatory Visit: Payer: Self-pay | Admitting: Family Medicine

## 2023-01-09 DIAGNOSIS — I10 Essential (primary) hypertension: Secondary | ICD-10-CM

## 2023-02-07 ENCOUNTER — Ambulatory Visit: Payer: 59 | Admitting: Family Medicine

## 2023-02-07 NOTE — Progress Notes (Deleted)
I,Sha'taria Tonnie Stillman,acting as a Neurosurgeon for [employer], MD.,have documented all relevant documentation on the behalf of Ronnald Ramp, MD,as directed by  Ronnald Ramp, MD while in the presence of Ronnald Ramp, MD.   Established patient visit   Patient: Charles FAULK [name]   DOB: 08-29-1966   57 y.o. Male  MRN: [id number] Visit Date: 02/07/2023  Today's healthcare provider: Ronnald Ramp, MD   No chief complaint on file.  Subjective    HPI   Diabetes Mellitus Type II, Follow-up  Lab Results  Component Value Date   HGBA1C 10.7 (A) 12/05/2022   HGBA1C 12.0 (H) 09/06/2022   HGBA1C 9.7 (H) 07/08/2021   Wt Readings from Last 3 Encounters:  01/02/23 280 lb 3.2 oz (127.1 kg)  12/05/22 269 lb 6.4 oz (122.2 kg)  11/25/22 261 lb 11.2 oz (118.7 kg)   Last seen for diabetes 1 months ago.  Management since then includes add ozempic 0.25mg  once weekly, continue metformin 1000mg  twice daily, continue insulin 12 units daily of lantus. He reports {excellent/good/fair/poor:19665} compliance with treatment. He {is/is not:21021397} having side effects. {document side effects if present:1} Symptoms: {Yes/No:20286} fatigue {Yes/No:20286} foot ulcerations  {Yes/No:20286} appetite changes {Yes/No:20286} nausea  {Yes/No:20286} paresthesia of the feet  {Yes/No:20286} polydipsia  {Yes/No:20286} polyuria {Yes/No:20286} visual disturbances   {Yes/No:20286} vomiting     Home blood sugar records: {diabetes glucometry results:16657}  Episodes of hypoglycemia? {Yes/No:20286} {enter symptoms and frequency of symptoms if yes:1}   Current insulin regiment: {enter 'none' or type of insulin and number of units taken with each dose of each insulin formulation that the patient is taking:1} Most Recent Eye Exam: *** {Current exercise:16438:::1} {Current diet habits:16563:::1}  Pertinent Labs: Lab Results  Component Value Date   CHOL 134  01/02/2023   HDL 39 (L) 01/02/2023   LDLCALC 73 01/02/2023   TRIG 122 01/02/2023   CHOLHDL 3.4 01/02/2023   Lab Results  Component Value Date   NA 139 01/02/2023   K 4.1 01/02/2023   CREATININE 1.11 01/02/2023   EGFR 76 01/02/2023   LABMICR 44.2 09/06/2022   MICRALBCREAT 26 09/06/2022     ---------------------------------------------------------------------------------------------------   Medications: Outpatient Medications Prior to Visit  Medication Sig   aspirin 325 MG tablet Take 325 mg by mouth daily.   atorvastatin (LIPITOR) 10 MG tablet Take 1 tablet (10 mg total) by mouth daily.   blood glucose meter kit and supplies Use glucose meter to test blood sugar once in the morning after fasting and 2 hours after largest meal of the day   chlorhexidine (PERIDEX) 0.12 % solution SMARTSIG:By Mouth   Dulaglutide (TRULICITY) 0.75 MG/0.5ML SOPN Inject 0.75 mg into the skin once a week.   fluticasone (FLONASE) 50 MCG/ACT nasal spray Place 2 sprays into both nostrils daily.   glucose blood test strip Use as instructed to test blood sugar up to twice daily.   insulin glargine (LANTUS SOLOSTAR) 100 UNIT/ML Solostar Pen Inject 12 Units into the skin daily.   Insulin Pen Needle (SURE-FINE PEN NEEDLES) 31G X 8 MM MISC 1 Needle by Does not apply route daily.   lisinopril-hydrochlorothiazide (ZESTORETIC) 10-12.5 MG tablet TAKE 1 TABLET BY MOUTH EVERY DAY   metFORMIN (GLUCOPHAGE) 1000 MG tablet TAKE 1 TABLET BY MOUTH EVERY DAY WITH BREAKFAST   tamsulosin (FLOMAX) 0.4 MG CAPS capsule TAKE 1 CAPSULE BY MOUTH EVERY DAY   No facility-administered medications prior to visit.    Review of Systems  {Labs  Heme  Chem  Endocrine  Serology  Results Review (optional):23779}   Objective    There were no vitals taken for this visit. {Show previous vital signs (optional):23777}  Physical Exam  ***  No results found for any visits on 02/07/23.  Assessment & Plan     ***  No follow-ups on  file.      {provider attestation***:1}   Ronnald Ramp, MD  [facility] [phone] (phone) [phone] (fax)  [facility]

## 2023-02-23 ENCOUNTER — Other Ambulatory Visit: Payer: Self-pay

## 2023-02-23 ENCOUNTER — Ambulatory Visit: Payer: Self-pay

## 2023-02-23 DIAGNOSIS — E1169 Type 2 diabetes mellitus with other specified complication: Secondary | ICD-10-CM

## 2023-02-23 DIAGNOSIS — E1165 Type 2 diabetes mellitus with hyperglycemia: Secondary | ICD-10-CM

## 2023-02-23 NOTE — Telephone Encounter (Signed)
  Chief Complaint: Medication question - Ozempic/Trulicity Symptoms: none Frequency: now Pertinent Negatives: Patient denies  Disposition: [] ED /[] Urgent Care (no appt availability in office) / [] Appointment(In office/virtual)/ []  [place] [facility]/ [] Home Care/ [] Refused Recommended Disposition /[] [place] [facility]/ [x]  Follow-up with PCP Additional Notes:  Is this switch to the new medication ok? Also there was an increase in dosage. Is provider aware, and is it ok to start this new medication at this dose.    Summary: Question about medication switch   The spouse called in stating his medication changed from Ozempic to Dulaglutide (TRULICITY) 0.75 MG/0.5ML SOPN. He was taking 0.25 of the Ozempic and he just wants to make sure he is ok with starting with this dose of Trulicity at this point? Please assist patient further       Reason for Disposition  [1] Pharmacy calling with prescription question AND [2] triager unable to answer question  Answer Assessment - Initial Assessment Questions 1. NAME of MEDICINE: "What medicine(s) are you calling about?"     Ozempic/ Trulicity 2. QUESTION: "What is your question?" (e.g., double dose of medicine, side effect)     Is this switch to the new medication ok? Also there was an increase in dosage. Is provider aware, and is it ok to start this new medication at this dose. 3. PRESCRIBER: "Who prescribed the medicine?" Reason: if prescribed by specialist, call should be referred to that group.     Dr. Roxan Hockey  Protocols used: Medication Question Call-A-AH

## 2023-02-24 MED ORDER — LANTUS SOLOSTAR 100 UNIT/ML ~~LOC~~ SOPN
12.0000 [IU] | PEN_INJECTOR | Freq: Every day | SUBCUTANEOUS | 1 refills | Status: DC
Start: 2023-02-24 — End: 2023-03-08

## 2023-02-24 NOTE — Telephone Encounter (Signed)
Requested Prescriptions  Pending Prescriptions Disp Refills   insulin glargine (LANTUS SOLOSTAR) 100 UNIT/ML Solostar Pen 15 mL 1    Sig: Inject 12 Units into the skin daily.     Endocrinology:  Diabetes - Insulins Failed - 02/23/2023  5:06 PM      Failed - HBA1C is between 0 and 7.9 and within 180 days    Hemoglobin A1C  Date Value Ref Range Status  12/05/2022 10.7 (A) 4.0 - 5.6 % Final   Hgb A1c MFr Bld  Date Value Ref Range Status  09/06/2022 12.0 (H) 4.8 - 5.6 % Final    Comment:             Prediabetes: 5.7 - 6.4          Diabetes: >6.4          Glycemic control for adults with diabetes: <7.0          Passed - Valid encounter within last 6 months    Recent Outpatient Visits           1 month ago Hyperlipidemia associated with type 2 diabetes mellitus (HCC)   [place] [facility] Simmons-Robinson, [place], MD   2 months ago Cellulitis of right lower extremity   [place] [facility] Merita Norton T, FNP   3 months ago Acute bilateral low back pain, unspecified whether sciatica present   [place] [facility] Simmons-Robinson, [place], MD   3 months ago Cellulitis of right lower extremity   [place] [facility] Merita Norton T, FNP   3 months ago Type 2 diabetes mellitus with hyperglycemia, with long-term current use of insulin (HCC)   [place] [facility] Simmons-Robinson, Tawanna Cooler, MD       Future Appointments             In 6 days Simmons-Robinson, Tawanna Cooler, MD [facility]

## 2023-02-28 ENCOUNTER — Other Ambulatory Visit: Payer: Self-pay | Admitting: Family Medicine

## 2023-02-28 DIAGNOSIS — E1165 Type 2 diabetes mellitus with hyperglycemia: Secondary | ICD-10-CM

## 2023-02-28 MED ORDER — SEMAGLUTIDE(0.25 OR 0.5MG/DOS) 2 MG/1.5ML ~~LOC~~ SOPN
0.5000 mg | PEN_INJECTOR | SUBCUTANEOUS | 1 refills | Status: DC
Start: 2023-02-28 — End: 2023-03-01

## 2023-02-28 NOTE — Telephone Encounter (Signed)
Patient advised. Verbalized understanding

## 2023-02-28 NOTE — Telephone Encounter (Signed)
Please advise patient to increase Ozempic dose to 0.5 mg once weekly.  I have discontinued the Trulicity.  Patient should continue with Ozempic  Ronnald Ramp, MD  [facility]

## 2023-03-01 NOTE — Telephone Encounter (Signed)
Pharmacy comment: Alternative Requested:THE PRESCRIBED MEDICATION IS NOT COVERED BY INSURANCE. PLEASE CONSIDER CHANGING TO ONE OF THE SUGGESTED COVERED ALTERNATIVES.

## 2023-03-01 NOTE — Progress Notes (Addendum)
I,Sha'taria Tyson,acting as a Neurosurgeon for [employer], MD.,have documented all relevant documentation on the behalf of Ronnald Ramp, MD,as directed by  Ronnald Ramp, MD while in the presence of Ronnald Ramp, MD.   Established patient visit   Patient: Charles BARTZ [name]   DOB: 1965-12-31   57 y.o. Male  MRN: [id number] Visit Date: 03/02/2023  Today's healthcare provider: Ronnald Ramp, MD   Chief Complaint  Patient presents with   Diabetes   Subjective      Diabetes Mellitus Type II, Follow-up  Lab Results  Component Value Date   HGBA1C 9.1 (A) 03/02/2023   HGBA1C 10.7 (A) 12/05/2022   HGBA1C 12.0 (H) 09/06/2022   Wt Readings from Last 3 Encounters:  03/02/23 270 lb 12.8 oz (122.8 kg)  01/02/23 280 lb 3.2 oz (127.1 kg)  12/05/22 269 lb 6.4 oz (122.2 kg)   Last seen for diabetes 3 months ago.  Management since then includes added ozempic 0.25mg  once weekly -continue metformin 1000mg  twice daily -continue insulin 12 units daily of lantus . He reports excellent compliance with treatment. He is not having side effects.  Symptoms: No fatigue No foot ulcerations  No appetite changes No nausea  No paresthesia of the feet  No polydipsia  No polyuria No visual disturbances   No vomiting     Home blood sugar records:  115-120  Episodes of hypoglycemia? No    Current insulin regiment: Lantus Solostar- Inject 12 units daily Most Recent Eye Exam: 08/24/2022  The patient's diabetes management regimen also includes a once-weekly injection (Ozempic that has been changed to trulicity) which he has been tolerating well. However, there have been issues with insurance coverage for this medication, leading to a switch to a different medication. The patient has not yet started this new medication and expresses a preference for the original medication. He also takes metformin twice daily for diabetes management and a statin  for cholesterol control. .      Pertinent Labs: Lab Results  Component Value Date   CHOL 134 01/02/2023   HDL 39 (L) 01/02/2023   LDLCALC 73 01/02/2023   TRIG 122 01/02/2023   CHOLHDL 3.4 01/02/2023   Lab Results  Component Value Date   NA 139 01/02/2023   K 4.1 01/02/2023   CREATININE 1.11 01/02/2023   EGFR 76 01/02/2023   LABMICR 44.2 09/06/2022   MICRALBCREAT 26 09/06/2022     ---------------------------------------------------------------------------------------------------  Discussed the use of AI scribe software for clinical note transcription with the patient, who gave verbal consent to proceed.  History of Present Illness   Left Sided Back Pain and Left Leg Pain    The patient presents with a chief complaint of persistent lower back pain, which has been notably worsening. The pain is localized to the lower back, specifically around the L5 vertebrae, and is associated with visible swelling that has since resolved. The patient has been managing the pain with a muscle relaxer, which he takes upon returning home from work due to its sedative effects. However, the pain seems to intensify upon lying down, particularly when he is in a supine position. The patient reports that the pain is so severe that it often wakes him from sleep and has led to episodes of nausea and sweating. 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  In addition to the back  pain, the patient also reports experiencing pain in the leg, which is described as a cramping sensation. The pain appears to be anterior and is most severe when he is lying flat. The patient has been using a back brace and an H bandage to manage the pain, which he reports provides some relief. However, the pain returns upon removal of these supports. The patient also notes that the pain seems to be alleviated when  the leg is elevated.  The patient has a history of hip pain, which was previously suggested to be a sign of needing a hip replacement. However, the current pain is different, being more anterior and involving the leg. The patient has been taking ibuprofen for the pain, which he reports helps. He has also been prescribed a muscle relaxer and an anti-inflammatory medication, which he takes as needed.   Medications: Outpatient Medications Prior to Visit  Medication Sig   aspirin 325 MG tablet Take 325 mg by mouth daily.   blood glucose meter kit and supplies Use glucose meter to test blood sugar once in the morning after fasting and 2 hours after largest meal of the day   chlorhexidine (PERIDEX) 0.12 % solution SMARTSIG:By Mouth   Dulaglutide (TRULICITY) 0.75 MG/0.5ML SOPN Inject 0.75 mg into the skin once a week.   fluticasone (FLONASE) 50 MCG/ACT nasal spray Place 2 sprays into both nostrils daily.   glucose blood test strip Use as instructed to test blood sugar up to twice daily.   insulin glargine (LANTUS SOLOSTAR) 100 UNIT/ML Solostar Pen Inject 12 Units into the skin daily.   Insulin Pen Needle (SURE-FINE PEN NEEDLES) 31G X 8 MM MISC 1 Needle by Does not apply route daily.   lisinopril-hydrochlorothiazide (ZESTORETIC) 10-12.5 MG tablet TAKE 1 TABLET BY MOUTH EVERY DAY   [DISCONTINUED] atorvastatin (LIPITOR) 10 MG tablet Take 1 tablet (10 mg total) by mouth daily.   [DISCONTINUED] metFORMIN (GLUCOPHAGE) 1000 MG tablet TAKE 1 TABLET BY MOUTH EVERY DAY WITH BREAKFAST   [DISCONTINUED] tamsulosin (FLOMAX) 0.4 MG CAPS capsule TAKE 1 CAPSULE BY MOUTH EVERY DAY   No facility-administered medications prior to visit.    Review of Systems     Objective    BP (!) 138/103   Pulse 99   Wt 270 lb 12.8 oz (122.8 kg)   SpO2 100%   BMI 32.96 kg/m    Physical Exam Vitals reviewed.  Constitutional:      General: He is not in acute distress.    Appearance: Normal appearance. He is not  ill-appearing, toxic-appearing or diaphoretic.  Eyes:     Conjunctiva/sclera: Conjunctivae normal.  Cardiovascular:     Rate and Rhythm: Normal rate and regular rhythm.     Pulses: Normal pulses.     Heart sounds: Normal heart sounds. No murmur heard.    No friction rub. No gallop.  Pulmonary:     Effort: Pulmonary effort is normal. No respiratory distress.     Breath sounds: Normal breath sounds. No stridor. No wheezing, rhonchi or rales.  Abdominal:     General: Bowel sounds are normal. There is no distension.     Palpations: Abdomen is soft.     Tenderness: There is no abdominal tenderness.  Musculoskeletal:     Right lower leg: No edema.     Left lower leg: No edema.     Comments: Hip:  - Inspection: No gross deformity, no swelling, erythema, or ecchymosis - Palpation: No TTP, specifically none over greater trochanter - ROM:  Normal range of motion on Flexion, extension, abduction, internal and external rotation - Strength: Normal strength. - Neuro/vasc: NV intact distally  Back: No gross deformity, scoliosis. TTP minimal in left lumbar region .  No midline or bony TTP. FROM. Strength LEs 5/5 all muscle groups.    Negative SLRs. Sensation intact to light touch bilaterally. Negative logroll bilateral hips Negative fabers and piriformis stretches.   Skin:    Findings: No erythema or rash.  Neurological:     Mental Status: He is alert and oriented to person, place, and time.      Results for orders placed or performed in visit on 03/02/23  POCT HgB A1C  Result Value Ref Range   Hemoglobin A1C 9.1 (A) 4.0 - 5.6 %   HbA1c POC (<> result, manual entry)     HbA1c, POC (prediabetic range)     HbA1c, POC (controlled diabetic range)      Assessment & Plan     Problem List Items Addressed This Visit       Endocrine   Diabetes mellitus (HCC) - Primary      A1C down to 9. Currently on Metformin 1000mg  twice a day. Previously on Ozempic, but insurance coverage issues  have led to a switch to Trulicity 0.75mg  once a week. -Continue Metformin 1000mg  twice a day. -Continue Trulicity 0.75mg  once a week until Ozempic can be obtained. -Patient to contact insurance company regarding coverage for Ozempic. -repeat POCT A1c today   DM Health Maintenance Patient on statin   Patient on ACE/ARB UTD DM eye exam Urine microalbumin UTD, normal  Foot exam UTD       Relevant Medications   atorvastatin (LIPITOR) 10 MG tablet   metFORMIN (GLUCOPHAGE) 1000 MG tablet   Other Relevant Orders   POCT HgB A1C (Completed)   Hyperlipidemia associated with type 2 diabetes mellitus (HCC)    Chronic  Hyperlipidemia: Well-managed on Atorvastatin 10mg . -Continue Atorvastatin 10mg  daily, refills provided       Relevant Medications   atorvastatin (LIPITOR) 10 MG tablet   metFORMIN (GLUCOPHAGE) 1000 MG tablet     Other   Thigh pain, musculoskeletal, left    Lower Back Pain with Leg Pain: Pain exacerbated by lying flat and relieved by sitting. Swelling noted in the lower back. Pain radiates from the lower back to the anterior leg. Possible involvement of the psoas muscle or hip arthritis. Pain severe enough to cause nausea. -Refer to sports medicine for further evaluation and possible MRI. -Prescribe Meloxicam 15mg  daily for pain and inflammation. Discontinue use of ibuprofen. -Prescribe Tizanidine, a non-sedating muscle relaxer, to be taken three times a day.       Relevant Medications   meloxicam (MOBIC) 15 MG tablet   tiZANidine (ZANAFLEX) 4 MG capsule   Other Relevant Orders   Ambulatory referral to Sports Medicine   Left lumbar pain    Pain exacerbated by lying flat and relieved by sitting. Swelling noted in the lower back. Pain radiates from the lower back to the anterior leg. Possible involvement of the psoas muscle or hip arthritis. Pain severe enough to cause nausea. -Refer to sports medicine for further evaluation and possible MRI. -Prescribe Meloxicam 15mg   daily for pain and inflammation. Discontinue use of ibuprofen. -Prescribe Tizanidine, a non-sedating muscle relaxer, to be taken three times a day.       Relevant Medications   meloxicam (MOBIC) 15 MG tablet   tiZANidine (ZANAFLEX) 4 MG capsule   Other Relevant Orders  Ambulatory referral to Sports Medicine   Benign prostatic hyperplasia with urinary frequency    Chronic  Well-managed on Flomax 0.4mg . -Continue Flomax 0.4mg  daily.       Relevant Medications   tamsulosin (FLOMAX) 0.4 MG CAPS capsule     Return in about 1 month (around 04/01/2023) for DM, HTN.          The entirety of the information documented in the History of Present Illness, Review of Systems and Physical Exam were personally obtained by me. Portions of this information were initially documented by Acey Lav. I, Ronnald Ramp, MD have reviewed the documentation above for thoroughness and accuracy.     Ronnald Ramp, MD  [facility] [phone] (phone) [phone] (fax)  [facility]

## 2023-03-02 ENCOUNTER — Encounter: Payer: Self-pay | Admitting: Family Medicine

## 2023-03-02 ENCOUNTER — Ambulatory Visit (INDEPENDENT_AMBULATORY_CARE_PROVIDER_SITE_OTHER): Payer: 59 | Admitting: Family Medicine

## 2023-03-02 ENCOUNTER — Other Ambulatory Visit: Payer: Self-pay | Admitting: Family Medicine

## 2023-03-02 VITALS — BP 138/103 | HR 99 | Wt 270.8 lb

## 2023-03-02 DIAGNOSIS — M545 Low back pain, unspecified: Secondary | ICD-10-CM | POA: Diagnosis not present

## 2023-03-02 DIAGNOSIS — E1169 Type 2 diabetes mellitus with other specified complication: Secondary | ICD-10-CM

## 2023-03-02 DIAGNOSIS — N401 Enlarged prostate with lower urinary tract symptoms: Secondary | ICD-10-CM | POA: Diagnosis not present

## 2023-03-02 DIAGNOSIS — M4727 Other spondylosis with radiculopathy, lumbosacral region: Secondary | ICD-10-CM | POA: Insufficient documentation

## 2023-03-02 DIAGNOSIS — M1612 Unilateral primary osteoarthritis, left hip: Secondary | ICD-10-CM | POA: Insufficient documentation

## 2023-03-02 DIAGNOSIS — R350 Frequency of micturition: Secondary | ICD-10-CM

## 2023-03-02 DIAGNOSIS — Z794 Long term (current) use of insulin: Secondary | ICD-10-CM

## 2023-03-02 DIAGNOSIS — E1165 Type 2 diabetes mellitus with hyperglycemia: Secondary | ICD-10-CM

## 2023-03-02 DIAGNOSIS — M79652 Pain in left thigh: Secondary | ICD-10-CM

## 2023-03-02 DIAGNOSIS — E785 Hyperlipidemia, unspecified: Secondary | ICD-10-CM

## 2023-03-02 LAB — POCT GLYCOSYLATED HEMOGLOBIN (HGB A1C): Hemoglobin A1C: 9.1 % — AB (ref 4.0–5.6)

## 2023-03-02 MED ORDER — TAMSULOSIN HCL 0.4 MG PO CAPS: 0.4000 mg | ORAL_CAPSULE | Freq: Every day | ORAL | 1 refills | Status: DC

## 2023-03-02 MED ORDER — ATORVASTATIN CALCIUM 10 MG PO TABS
10.0000 mg | ORAL_TABLET | Freq: Every day | ORAL | 1 refills | Status: DC
Start: 2023-03-02 — End: 2023-09-04

## 2023-03-02 MED ORDER — MELOXICAM 15 MG PO TABS
15.0000 mg | ORAL_TABLET | Freq: Every day | ORAL | 0 refills | Status: DC
Start: 2023-03-02 — End: 2023-11-07

## 2023-03-02 MED ORDER — TIZANIDINE HCL 4 MG PO CAPS
4.0000 mg | ORAL_CAPSULE | Freq: Three times a day (TID) | ORAL | 0 refills | Status: DC
Start: 2023-03-02 — End: 2023-03-28

## 2023-03-02 MED ORDER — METFORMIN HCL 1000 MG PO TABS: 1000.0000 mg | ORAL_TABLET | Freq: Two times a day (BID) | ORAL | 1 refills | Status: DC

## 2023-03-02 NOTE — Assessment & Plan Note (Signed)
>>  ASSESSMENT AND PLAN FOR THIGH PAIN, MUSCULOSKELETAL, LEFT WRITTEN ON 03/02/2023  5:33 PM BY SIMMONS-ROBINSON, MAKIERA, MD  Lower Back Pain with Leg Pain: Pain exacerbated by lying flat and relieved by sitting. Swelling noted in the lower back. Pain radiates from the lower back to the anterior leg. Possible involvement of the psoas muscle or hip arthritis. Pain severe enough to cause nausea. -Refer to sports medicine for further evaluation and possible MRI. -Prescribe Meloxicam 15mg  daily for pain and inflammation. Discontinue use of ibuprofen. -Prescribe Tizanidine, a non-sedating muscle relaxer, to be taken three times a day.

## 2023-03-02 NOTE — Assessment & Plan Note (Signed)
Chronic  Well-managed on Flomax 0.4mg . -Continue Flomax 0.4mg  daily.

## 2023-03-02 NOTE — Assessment & Plan Note (Signed)
Lower Back Pain with Leg Pain: Pain exacerbated by lying flat and relieved by sitting. Swelling noted in the lower back. Pain radiates from the lower back to the anterior leg. Possible involvement of the psoas muscle or hip arthritis. Pain severe enough to cause nausea. -Refer to sports medicine for further evaluation and possible MRI. -Prescribe Meloxicam 15mg  daily for pain and inflammation. Discontinue use of ibuprofen. -Prescribe Tizanidine, a non-sedating muscle relaxer, to be taken three times a day.

## 2023-03-02 NOTE — Assessment & Plan Note (Signed)
Chronic  Hyperlipidemia: Well-managed on Atorvastatin 10mg . -Continue Atorvastatin 10mg  daily, refills provided

## 2023-03-02 NOTE — Assessment & Plan Note (Signed)
>>  ASSESSMENT AND PLAN FOR LEFT LUMBAR PAIN WRITTEN ON 03/02/2023  5:33 PM BY Ronnald Ramp, MD  Pain exacerbated by lying flat and relieved by sitting. Swelling noted in the lower back. Pain radiates from the lower back to the anterior leg. Possible involvement of the psoas muscle or hip arthritis. Pain severe enough to cause nausea. -Refer to sports medicine for further evaluation and possible MRI. -Prescribe Meloxicam 15mg  daily for pain and inflammation. Discontinue use of ibuprofen. -Prescribe Tizanidine, a non-sedating muscle relaxer, to be taken three times a day.

## 2023-03-02 NOTE — Assessment & Plan Note (Addendum)
  A1C down to 9. Currently on Metformin 1000mg  twice a day. Previously on Ozempic, but insurance coverage issues have led to a switch to Trulicity 0.75mg  once a week. -Continue Metformin 1000mg  twice a day. -Continue Trulicity 0.75mg  once a week until Ozempic can be obtained. -Patient to contact insurance company regarding coverage for Ozempic. -repeat POCT A1c today   DM Health Maintenance Patient on statin   Patient on ACE/ARB UTD DM eye exam Urine microalbumin UTD, normal  Foot exam UTD

## 2023-03-02 NOTE — Assessment & Plan Note (Signed)
Pain exacerbated by lying flat and relieved by sitting. Swelling noted in the lower back. Pain radiates from the lower back to the anterior leg. Possible involvement of the psoas muscle or hip arthritis. Pain severe enough to cause nausea. -Refer to sports medicine for further evaluation and possible MRI. -Prescribe Meloxicam 15mg  daily for pain and inflammation. Discontinue use of ibuprofen. -Prescribe Tizanidine, a non-sedating muscle relaxer, to be taken three times a day.

## 2023-03-02 NOTE — Patient Instructions (Addendum)
A referral has been placed on your behalf for sports medicine for your back and leg pain. Our referral coordination team or the office you will be visiting will contact you within the next 2 weeks.  If you have not received a phone call within 10 business days please let us know so that we can check into this for you.    I have sent in a prescription for Meloxicam 15mg  to take once daily. Please do not take ibuprofen or naproxen while on this medication.   Please call the insurance company to see if they will cover Ozempic for your diabetes again in place of the Trulicity.    For now, please take the trulicity 0.75mg  once weekly and continue the metformin

## 2023-03-03 NOTE — Telephone Encounter (Signed)
Requested medications are due for refill today.  See pharmacy note  Requested medications are on the active medications list.  yes  Last refill. 6/21/204  Future visit scheduled.   yes  Notes to clinic.  Pharmacy comment: Alternative Requested:PRIOR AUTH REQUIRED.     Requested Prescriptions  Pending Prescriptions Disp Refills   LANTUS SOLOSTAR 100 UNIT/ML Solostar Pen [Pharmacy Med Name: LANTUS SOLOSTAR 100 UNIT/ML]  1    Sig: INJECT 12 UNITS INTO THE SKIN DAILY. (NOT COVERED)     Endocrinology:  Diabetes - Insulins Failed - 03/02/2023  5:22 PM      Failed - HBA1C is between 0 and 7.9 and within 180 days    Hemoglobin A1C  Date Value Ref Range Status  03/02/2023 9.1 (A) 4.0 - 5.6 % Final   Hgb A1c MFr Bld  Date Value Ref Range Status  09/06/2022 12.0 (H) 4.8 - 5.6 % Final    Comment:             Prediabetes: 5.7 - 6.4          Diabetes: >6.4          Glycemic control for adults with diabetes: <7.0          Passed - Valid encounter within last 6 months    Recent Outpatient Visits           Yesterday Type 2 diabetes mellitus with hyperglycemia, with long-term current use of insulin (HCC)   [place] [facility] Simmons-Robinson, [place], MD   2 months ago Hyperlipidemia associated with type 2 diabetes mellitus (HCC)   [place] [facility] Simmons-Robinson, [place], MD   2 months ago Cellulitis of right lower extremity   [place] [facility] Merita Norton T, FNP   3 months ago Acute bilateral low back pain, unspecified whether sciatica present   [place] [facility] Simmons-Robinson, [place], MD   3 months ago Cellulitis of right lower extremity   [place] [facility] Merita Norton T, FNP       Future Appointments             In 2 weeks Simmons-Robinson, Tawanna Cooler, MD [facility]   In 3 months Simmons-Robinson, [place], MD [facility] [place]

## 2023-03-06 ENCOUNTER — Encounter: Payer: Self-pay | Admitting: Family Medicine

## 2023-03-06 ENCOUNTER — Ambulatory Visit (INDEPENDENT_AMBULATORY_CARE_PROVIDER_SITE_OTHER): Payer: 59 | Admitting: Family Medicine

## 2023-03-06 VITALS — BP 130/88 | HR 78 | Ht 76.0 in | Wt 274.0 lb

## 2023-03-06 DIAGNOSIS — M1612 Unilateral primary osteoarthritis, left hip: Secondary | ICD-10-CM | POA: Diagnosis not present

## 2023-03-06 DIAGNOSIS — M545 Low back pain, unspecified: Secondary | ICD-10-CM

## 2023-03-06 DIAGNOSIS — M4727 Other spondylosis with radiculopathy, lumbosacral region: Secondary | ICD-10-CM

## 2023-03-06 MED ORDER — DICLOFENAC SODIUM 75 MG PO TBEC
75.0000 mg | DELAYED_RELEASE_TABLET | Freq: Two times a day (BID) | ORAL | 1 refills | Status: DC | PRN
Start: 2023-03-06 — End: 2023-05-01

## 2023-03-07 ENCOUNTER — Other Ambulatory Visit: Payer: Self-pay | Admitting: Family Medicine

## 2023-03-07 DIAGNOSIS — E1169 Type 2 diabetes mellitus with other specified complication: Secondary | ICD-10-CM

## 2023-03-07 DIAGNOSIS — E1165 Type 2 diabetes mellitus with hyperglycemia: Secondary | ICD-10-CM

## 2023-03-08 ENCOUNTER — Other Ambulatory Visit: Payer: Self-pay | Admitting: Family Medicine

## 2023-03-08 DIAGNOSIS — E1169 Type 2 diabetes mellitus with other specified complication: Secondary | ICD-10-CM

## 2023-03-08 DIAGNOSIS — E1165 Type 2 diabetes mellitus with hyperglycemia: Secondary | ICD-10-CM

## 2023-03-08 MED ORDER — TOUJEO MAX SOLOSTAR 300 UNIT/ML ~~LOC~~ SOPN
12.0000 [IU] | PEN_INJECTOR | Freq: Every day | SUBCUTANEOUS | 1 refills | Status: DC
Start: 2023-03-08 — End: 2023-03-10

## 2023-03-10 ENCOUNTER — Telehealth: Payer: Self-pay | Admitting: Family Medicine

## 2023-03-10 ENCOUNTER — Other Ambulatory Visit: Payer: Self-pay | Admitting: Family Medicine

## 2023-03-10 DIAGNOSIS — Z794 Long term (current) use of insulin: Secondary | ICD-10-CM

## 2023-03-10 MED ORDER — FIASP FLEXTOUCH 100 UNIT/ML ~~LOC~~ SOPN
12.0000 [IU] | PEN_INJECTOR | Freq: Every day | SUBCUTANEOUS | 3 refills | Status: DC
Start: 2023-03-10 — End: 2023-03-21

## 2023-03-10 NOTE — Telephone Encounter (Signed)
Please notify patient of prescription change:   Insurance would not cover Lantus nor other options of long acting insulin.   Prescription has been updated to short acting insulin to start taking with dinner, 12 units.

## 2023-03-10 NOTE — Telephone Encounter (Signed)
Mrs. Mimms advised as below. She reports patient filled lantus at [facility].

## 2023-03-17 ENCOUNTER — Encounter: Payer: Self-pay | Admitting: Family Medicine

## 2023-03-17 ENCOUNTER — Ambulatory Visit (INDEPENDENT_AMBULATORY_CARE_PROVIDER_SITE_OTHER): Payer: 59 | Admitting: Family Medicine

## 2023-03-17 VITALS — BP 132/86 | HR 99 | Temp 98.0°F | Resp 20 | Ht 76.0 in | Wt 271.0 lb

## 2023-03-17 DIAGNOSIS — I152 Hypertension secondary to endocrine disorders: Secondary | ICD-10-CM | POA: Insufficient documentation

## 2023-03-17 DIAGNOSIS — E1165 Type 2 diabetes mellitus with hyperglycemia: Secondary | ICD-10-CM

## 2023-03-17 DIAGNOSIS — Z794 Long term (current) use of insulin: Secondary | ICD-10-CM

## 2023-03-17 DIAGNOSIS — I10 Essential (primary) hypertension: Secondary | ICD-10-CM | POA: Diagnosis not present

## 2023-03-17 MED ORDER — LISINOPRIL-HYDROCHLOROTHIAZIDE 20-25 MG PO TABS
1.0000 | ORAL_TABLET | Freq: Every day | ORAL | 3 refills | Status: DC
Start: 2023-03-17 — End: 2024-03-28

## 2023-03-17 NOTE — Progress Notes (Signed)
Established patient visit   Patient: Charles BRETL [name]   DOB: [birth year]   57 y.o. Male  MRN: [id number] Visit Date: 03/17/2023  Today's healthcare provider: Ronnald Ramp, MD   Chief Complaint  Patient presents with   Medical Management of Chronic Issues   Subjective    HPI Diabetes Mellitus Type II, Follow-up  Lab Results  Component Value Date   HGBA1C 9.1 (A) 03/02/2023   HGBA1C 10.7 (A) 12/05/2022   HGBA1C 12.0 (H) 09/06/2022   Wt Readings from Last 3 Encounters:  03/17/23 271 lb (122.9 kg)  03/06/23 274 lb (124.3 kg)  03/02/23 270 lb 12.8 oz (122.8 kg)   Last seen for diabetes 2 weeks ago.  Management since then includes continue metformin 1000 mg BID and Trulicity 0.75 mg once a week . He reports excellent compliance with treatment. He is not having side effects.  Symptoms: No fatigue No foot ulcerations  No appetite changes No nausea  No paresthesia of the feet  No polydipsia  No polyuria No visual disturbances   No vomiting     Home blood sugar records: fasting range: 105  after eating 115-120  Episodes of hypoglycemia? No    Current insulin regiment: none Most Recent Eye Exam: UTD   Pertinent Labs: Lab Results  Component Value Date   CHOL 134 01/02/2023   HDL 39 (L) 01/02/2023   LDLCALC 73 01/02/2023   TRIG 122 01/02/2023   CHOLHDL 3.4 01/02/2023   Lab Results  Component Value Date   NA 139 01/02/2023   K 4.1 01/02/2023   CREATININE 1.11 01/02/2023   EGFR 76 01/02/2023   MICRALBCREAT 26 09/06/2022     --------------------------------------------------------------------------------------------------- Hypertension, follow-up  BP Readings from Last 3 Encounters:  03/17/23 132/86  03/06/23 130/88  03/02/23 (!) 138/103   Wt Readings from Last 3 Encounters:  03/17/23 271 lb (122.9 kg)  03/06/23 274 lb (124.3 kg)  03/02/23 270 lb 12.8 oz (122.8 kg)     He was last seen for hypertension 2 months ago.  BP at that visit  was 138/103. Management since that visit includes no changes.  Outside blood pressures are 120-130/78. Symptoms: No chest pain No chest pressure  No palpitations No syncope  No dyspnea No orthopnea  No paroxysmal nocturnal dyspnea No lower extremity edema   Pertinent labs Lab Results  Component Value Date   CHOL 134 01/02/2023   HDL 39 (L) 01/02/2023   LDLCALC 73 01/02/2023   TRIG 122 01/02/2023   CHOLHDL 3.4 01/02/2023   Lab Results  Component Value Date   NA 139 01/02/2023   K 4.1 01/02/2023   CREATININE 1.11 01/02/2023   EGFR 76 01/02/2023   GLUCOSE 289 (H) 01/02/2023   TSH 1.590 07/08/2021     The 10-year ASCVD risk score (Arnett DK, et al., 2019) is: 23.4%  --------------------------------------------------------------------------------------------------- Discussed the use of AI scribe software for clinical note transcription with the patient, who gave verbal consent to proceed.  History of Present Illness     Patient reports  blood pressure at home, which typically ranges from 125/75 to 130/80. However, due to a demanding work schedule with long hours and irregular eating habits, the patient has noticed fluctuations in his blood pressure. Despite the challenging work conditions, the patient makes an effort to maintain a healthy diet, often opting for fruits, vegetables, and low-sodium snacks.  The patient also reports lower back pain, which has been evaluated by a specialist. The specialist noted  tight muscles and a narrow gap between the L5 and S1 vertebrae, suggesting a potential cause for the discomfort.  The patient is currently managing his health conditions with various medications, including a short-acting insulin for diabetes. However, he expresses a preference for a long-acting insulin due to its additional benefits for kidney and heart health.       Medications: Outpatient Medications Prior to Visit  Medication Sig   aspirin 325 MG tablet Take 325 mg by  mouth daily.   atorvastatin (LIPITOR) 10 MG tablet Take 1 tablet (10 mg total) by mouth daily.   blood glucose meter kit and supplies Use glucose meter to test blood sugar once in the morning after fasting and 2 hours after largest meal of the day   chlorhexidine (PERIDEX) 0.12 % solution SMARTSIG:By Mouth   diclofenac (VOLTAREN) 75 MG EC tablet Take 1 tablet (75 mg total) by mouth 2 (two) times daily as needed.   Dulaglutide (TRULICITY) 0.75 MG/0.5ML SOPN Inject 0.75 mg into the skin once a week.   fluticasone (FLONASE) 50 MCG/ACT nasal spray Place 2 sprays into both nostrils daily.   glucose blood test strip Use as instructed to test blood sugar up to twice daily.   insulin aspart (FIASP FLEXTOUCH) 100 UNIT/ML FlexTouch Pen Inject 12 Units into the skin daily with supper.   Insulin Pen Needle (SURE-FINE PEN NEEDLES) 31G X 8 MM MISC 1 Needle by Does not apply route daily.   meloxicam (MOBIC) 15 MG tablet Take 1 tablet (15 mg total) by mouth daily.   metFORMIN (GLUCOPHAGE) 1000 MG tablet Take 1 tablet (1,000 mg total) by mouth 2 (two) times daily with a meal.   tamsulosin (FLOMAX) 0.4 MG CAPS capsule Take 1 capsule (0.4 mg total) by mouth daily.   tiZANidine (ZANAFLEX) 4 MG capsule Take 1 capsule (4 mg total) by mouth 3 (three) times daily.   [DISCONTINUED] lisinopril-hydrochlorothiazide (ZESTORETIC) 10-12.5 MG tablet TAKE 1 TABLET BY MOUTH EVERY DAY   No facility-administered medications prior to visit.    Review of Systems  Last metabolic panel Lab Results  Component Value Date   GLUCOSE 289 (H) 01/02/2023   NA 139 01/02/2023   K 4.1 01/02/2023   CL 100 01/02/2023   CO2 22 01/02/2023   BUN 13 01/02/2023   CREATININE 1.11 01/02/2023   EGFR 76 01/02/2023   CALCIUM 9.5 01/02/2023   PROT 6.8 01/02/2023   ALBUMIN 4.1 01/02/2023   LABGLOB 2.7 01/02/2023   AGRATIO 1.5 01/02/2023   BILITOT 0.3 01/02/2023   ALKPHOS 87 01/02/2023   AST 21 01/02/2023   ALT 30 01/02/2023   ANIONGAP 10  03/15/2020   Last lipids Lab Results  Component Value Date   CHOL 134 01/02/2023   HDL 39 (L) 01/02/2023   LDLCALC 73 01/02/2023   TRIG 122 01/02/2023   CHOLHDL 3.4 01/02/2023   Last hemoglobin A1c Lab Results  Component Value Date   HGBA1C 9.1 (A) 03/02/2023         Objective    BP 132/86 (BP Location: Right Arm, Patient Position: Sitting, Cuff Size: Large)   Pulse 99   Temp 98 F (36.7 C) (Temporal)   Resp 20   Ht 6\' 4"  (1.93 m)   Wt 271 lb (122.9 kg)   SpO2 99%   BMI 32.99 kg/m  BP Readings from Last 3 Encounters:  03/17/23 132/86  03/06/23 130/88  03/02/23 (!) 138/103   Wt Readings from Last 3 Encounters:  03/17/23 271 lb (122.9  kg)  03/06/23 274 lb (124.3 kg)  03/02/23 270 lb 12.8 oz (122.8 kg)        Physical Exam Vitals reviewed.  Constitutional:      General: He is not in acute distress.    Appearance: Normal appearance. He is not ill-appearing, toxic-appearing or diaphoretic.  Eyes:     Conjunctiva/sclera: Conjunctivae normal.  Cardiovascular:     Rate and Rhythm: Normal rate and regular rhythm.     Pulses: Normal pulses.     Heart sounds: Normal heart sounds. No murmur heard.    No friction rub. No gallop.  Pulmonary:     Effort: Pulmonary effort is normal. No respiratory distress.     Breath sounds: Normal breath sounds. No stridor. No wheezing, rhonchi or rales.  Abdominal:     General: Bowel sounds are normal. There is no distension.     Palpations: Abdomen is soft.     Tenderness: There is no abdominal tenderness.  Musculoskeletal:     Right lower leg: Edema present.     Left lower leg: No edema.     Comments: Trace RLE edema   Skin:    Findings: No erythema or rash.  Neurological:     Mental Status: He is alert and oriented to person, place, and time.       No results found for any visits on 03/17/23.  Assessment & Plan     Problem List Items Addressed This Visit     Diabetes mellitus (HCC)    Patient is currently on  Trulicity due to a shortage of Ozempic. Patient's preference is for Ozempic due to additional heart benefits. -Continue Trulicity 0.75mg  once weekly until Ozempic becomes available.  -Continue Metformin 1000mg  twice a day. -continue lantus 12 units daily       Relevant Medications   lisinopril-hydrochlorothiazide (ZESTORETIC) 20-25 MG tablet   Essential (primary) hypertension - Primary    Blood pressure readings at home are generally within normal range (120-130/70-88), but can be elevated during periods of high work stress and long hours. Currently on Lisinopril-Hydrochlorothiazide 10-12.5mg . -Increase Lisinopril-Hydrochlorothiazide to 20-25mg  daily. -Check blood pressure at home and follow up in 3-4 weeks. -BMP at follow up       Relevant Medications   lisinopril-hydrochlorothiazide (ZESTORETIC) 20-25 MG tablet      Return in about 3 weeks (around 04/07/2023) for HTN.        Ronnald Ramp, MD  [facility] [phone] (phone) [phone] (fax)  [facility]

## 2023-03-17 NOTE — Progress Notes (Signed)
     Primary Care / Sports Medicine Office Visit  Patient Information:  Patient ID: Charles Giangrande., male DOB: [birth year] Age: 57 y.o. MRN: [id number]   Charles CHARETTE [name] is a pleasant 57 y.o. male presenting with the following:  Chief Complaint  Patient presents with   Back Pain    Lower left   Leg Pain    left    Vitals:   03/06/23 1609  BP: 130/88  Pulse: 78  SpO2: 98%   Vitals:   03/06/23 1609  Weight: 274 lb (124.3 kg)  Height: 6\' 4"  (1.93 m)   Body mass index is 33.35 kg/m.  No results found.   Independent interpretation of notes and tests performed by another provider:   None  Procedures performed:   None  Pertinent History, Exam, Impression, and Recommendations:   [name] was seen today for back pain and leg pain.  Lumbosacral spondylosis with radiculopathy -     Diclofenac Sodium; Take 1 tablet (75 mg total) by mouth 2 (two) times daily as needed.  Dispense: 60 tablet; Refill: 1 -     Ambulatory referral to Physical Therapy  Primary osteoarthritis of left hip -     Diclofenac Sodium; Take 1 tablet (75 mg total) by mouth 2 (two) times daily as needed.  Dispense: 60 tablet; Refill: 1 -     Ambulatory referral to Physical Therapy     Orders & Medications Meds ordered this encounter  Medications   diclofenac (VOLTAREN) 75 MG EC tablet    Sig: Take 1 tablet (75 mg total) by mouth 2 (two) times daily as needed.    Dispense:  60 tablet    Refill:  1   Orders Placed This Encounter  Procedures   Ambulatory referral to Physical Therapy     No follow-ups on file.     [name], MD, [facility]   Primary Care Sports Medicine Primary Care and Sports Medicine at [facility]

## 2023-03-17 NOTE — Assessment & Plan Note (Signed)
>>  ASSESSMENT AND PLAN FOR LEFT LUMBAR PAIN WRITTEN ON 03/17/2023  2:30 AM BY Kayleeann Huxford, Ocie Bob, MD  Acute on chronic low back with thigh symptoms, examination most consistent with primary lumbosacral etiology with secondary/compensatory findings throughout the left leg.  Plan as follows: - Transition to diclofenac twice daily - Can continue tizanidine as needed - Formal PT - Follow-up as scheduled, suboptimal progress to be addressed with advanced imaging, consideration of trigger point injections, modification of pharmacotherapy

## 2023-03-17 NOTE — Assessment & Plan Note (Addendum)
Blood pressure readings at home are generally within normal range (120-130/70-88), but can be elevated during periods of high work stress and long hours. Currently on Lisinopril-Hydrochlorothiazide 10-12.5mg . -Increase Lisinopril-Hydrochlorothiazide to 20-25mg  daily. -Check blood pressure at home and follow up in 3-4 weeks. -BMP at follow up

## 2023-03-17 NOTE — Assessment & Plan Note (Signed)
Acute on chronic low back with thigh symptoms, examination most consistent with primary lumbosacral etiology with secondary/compensatory findings throughout the left leg.  Plan as follows: - Transition to diclofenac twice daily - Can continue tizanidine as needed - Formal PT - Follow-up as scheduled, suboptimal progress to be addressed with advanced imaging, consideration of trigger point injections, modification of pharmacotherapy

## 2023-03-17 NOTE — Patient Instructions (Addendum)
Please start the Lisinopril-hydrochlorothiazide 20-25mg  daily for your blood pressure and follow up with me in 3 weeks

## 2023-03-17 NOTE — Assessment & Plan Note (Signed)
Patient is currently on Trulicity due to a shortage of Ozempic. Patient's preference is for Ozempic due to additional heart benefits. -Continue Trulicity 0.75mg  once weekly until Ozempic becomes available.  -Continue Metformin 1000mg  twice a day. -continue lantus 12 units daily

## 2023-03-21 ENCOUNTER — Other Ambulatory Visit: Payer: Self-pay | Admitting: Family Medicine

## 2023-03-21 NOTE — Progress Notes (Signed)
Notified by pharmacy that Lantus would not be covered ([facility]) but prescription had been filled at [facility]   Patient provided MyChart image with insulin prescription for lantus.   Continue 12 units daily

## 2023-03-25 ENCOUNTER — Other Ambulatory Visit: Payer: Self-pay | Admitting: Family Medicine

## 2023-03-25 DIAGNOSIS — M545 Low back pain, unspecified: Secondary | ICD-10-CM

## 2023-03-25 DIAGNOSIS — M79652 Pain in left thigh: Secondary | ICD-10-CM

## 2023-03-27 ENCOUNTER — Ambulatory Visit: Payer: 59 | Attending: Family Medicine | Admitting: Physical Therapy

## 2023-03-27 ENCOUNTER — Other Ambulatory Visit: Payer: Self-pay | Admitting: Family Medicine

## 2023-03-27 DIAGNOSIS — M79652 Pain in left thigh: Secondary | ICD-10-CM

## 2023-03-27 DIAGNOSIS — M1612 Unilateral primary osteoarthritis, left hip: Secondary | ICD-10-CM | POA: Insufficient documentation

## 2023-03-27 DIAGNOSIS — M545 Low back pain, unspecified: Secondary | ICD-10-CM

## 2023-03-27 DIAGNOSIS — M4727 Other spondylosis with radiculopathy, lumbosacral region: Secondary | ICD-10-CM | POA: Diagnosis not present

## 2023-03-27 DIAGNOSIS — M5459 Other low back pain: Secondary | ICD-10-CM | POA: Diagnosis present

## 2023-03-27 NOTE — Therapy (Signed)
OUTPATIENT PHYSICAL THERAPY THORACOLUMBAR EVALUATION   Patient Name: Charles BALDI [name] MRN: [id number] DOB:01-Nov-1965, 57 y.o., male Today's Date: 03/28/2023  END OF SESSION:  PT End of Session - 03/27/23 1814     Visit Number 1    Number of Visits 20    Date for PT Re-Evaluation 06/05/23    Authorization Type [facility] 2024    Authorization - Visit Number 1    Authorization - Number of Visits 20    Progress Note Due on Visit 10    PT Start Time 1630    PT Stop Time 1715    PT Time Calculation (min) 45 min    Activity Tolerance Patient tolerated treatment well    Behavior During Therapy WFL for tasks assessed/performed             Past Medical History:  Diagnosis Date   Diabetes mellitus without complication (HCC)    Hypertension    Past Surgical History:  Procedure Laterality Date   COLONOSCOPY WITH PROPOFOL N/A 04/02/2021   Procedure: COLONOSCOPY WITH PROPOFOL;  Surgeon: Pasty Spillers, MD;  Location: [facility] ENDOSCOPY;  Service: Endoscopy;  Laterality: N/A;   COLONOSCOPY WITH PROPOFOL N/A 10/07/2022   Procedure: COLONOSCOPY WITH PROPOFOL;  Surgeon: Toney Reil, MD;  Location: [facility] ENDOSCOPY;  Service: Gastroenterology;  Laterality: N/A;   DISTAL BICEPS TENDON REPAIR Right 03/31/2015   Procedure: DISTAL BICEPS TENDON REPAIR;  Surgeon: Christena Flake, MD;  Location: [facility] ORS;  Service: Orthopedics;  Laterality: Right;   ELBOW SURGERY Right 1975   LEG SURGERY Right 1971   Patient Active Problem List   Diagnosis Date Noted   Essential (primary) hypertension 03/17/2023   Thigh pain, musculoskeletal, left 03/02/2023   Left lumbar pain 03/02/2023   Benign prostatic hyperplasia with urinary frequency 03/02/2023   Cellulitis of right lower extremity 11/22/2022   Hyperlipidemia associated with type 2 diabetes mellitus (HCC) 11/01/2022   Diabetes mellitus (HCC) 09/05/2022    PCP: Dr. Joseph Berkshire   REFERRING PROVIDER: Dr. Joseph Berkshire   REFERRING DIAG:   [phone] (ICD-10-CM) - Primary osteoarthritis of left hip  M47.27 (ICD-10-CM) - Lumbosacral spondylosis with radiculopathy    Rationale for Evaluation and Treatment: Rehabilitation  THERAPY DIAG:  Other low back pain  ONSET DATE: 2022   SUBJECTIVE:                                                                                                                                                                                           SUBJECTIVE STATEMENT: See pertinent history   PERTINENT HISTORY:  Pt reports that he first started to notice his  low back pain right around when he had kidney stones. He had imaging done which showed degenerative disc disease. He saw orthopedist who found tight muscles in low back. 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 He reports that most of his low back pain starts after work.   PAIN:  Are you having pain? Yes: NPRS scale: 9/10 Pain location: Left side of L5 vertebrae and radiates down lateral side of leg  Pain description: Throbbing and it feels like a tooth ache  Aggravating factors: Walking on hard surface and lifting heavier objects  Relieving factors: Sitting still   PRECAUTIONS: None   WEIGHT BEARING RESTRICTIONS: No  FALLS:  Has patient fallen in last 6 months? No  LIVING ENVIRONMENT: Lives with: lives with their spouse Lives in: House/apartment Stairs: No Has following equipment at home: None  OCCUPATION: Merchandiser, retail at warehouse   PLOF: Independent  PATIENT GOALS: To feel less low back pain when performing his job duties    NEXT MD VISIT: 04/2023   OBJECTIVE:   VITALS: BP 151/92 SpO2 99 HR 102   DIAGNOSTIC FINDINGS:  CLINICAL DATA:  back pain   EXAM: LUMBAR SPINE - COMPLETE 4+ VIEW   COMPARISON:  December 31, 2017   FINDINGS: There are five non-rib bearing lumbar-type vertebral bodies with a rudimentary disc space and partial RIGHT-sided lumbarization of S1-2. There is normal  alignment. There is no evidence for acute fracture or subluxation. There is moderate multilevel intervertebral disc space height loss with multilevel endplate proliferative changes, most pronounced at L5-S1. Facet arthropathy. Atherosclerotic calcifications. Degenerative changes of the LEFT hip.   IMPRESSION: Moderate multilevel degenerative changes of the lumbar spine most pronounced at L5-S1.     Electronically Signed   By: Meda Klinefelter M.D.   On: 11/29/2022 14:08    PATIENT SURVEYS:  FOTO 69/100 with target of 72   SCREENING FOR RED FLAGS: Bowel or bladder incontinence: No Spinal tumors: No Cauda equina syndrome: No Compression fracture: No Abdominal aneurysm: No  COGNITION: Overall cognitive status: Within functional limits for tasks assessed     SENSATION: WFL  MUSCLE LENGTH: Hamstrings: Right 90 deg; Left 90 deg Thomas test: NT   POSTURE: No Significant postural limitations  PALPATION: Left side of L5 vertebrae TTP   LUMBAR ROM:   AROM eval  Flexion 100%  Extension 100%  Right lateral flexion 100%  Left lateral flexion 100%  Right rotation 100%  Left rotation 100%   (Blank rows = not tested)  LOWER EXTREMITY ROM:     Active  Right eval Left eval  Hip flexion 120 120  Hip extension    Hip abduction    Hip adduction    Hip internal rotation    Hip external rotation    Knee flexion    Knee extension    Ankle dorsiflexion    Ankle plantarflexion    Ankle inversion    Ankle eversion     (Blank rows = not tested)         LOWER EXTREMITY MMT:    MMT Right eval Left eval  Hip flexion 4+ 4+  Hip extension 4 4  Hip abduction 4 4  Hip adduction    Hip internal rotation    Hip external rotation    Knee flexion 4+ 4+  Knee extension 4+ 4+  Ankle dorsiflexion 4+ 4+  Ankle plantarflexion    Ankle inversion    Ankle eversion     (  Blank rows = not tested)  LUMBAR SPECIAL TESTS:  Straight leg raise test:  Negative    GAIT: Distance walked: 50 ft  Assistive device utilized: None Level of assistance: Complete Independence Comments: No gait deficits noticed   TODAY'S TREATMENT:                                                                                                                              DATE:   03/27/23  Prone Quad Stretch 2 x 30 sec  Seated Hip ER Stretch on LLE 2 x 30 sec   PATIENT EDUCATION:  Education details: form and technique for correct performance of exercise  Person educated: Patient Education method: Explanation, Demonstration, Verbal cues, and Handouts Education comprehension: verbalized understanding and returned demonstration  HOME EXERCISE PROGRAM: Access Code: [email or web address] URL: [email or web address] Date: 03/27/2023 Prepared by: Ellin Goodie  Exercises - Prone Quadriceps Stretch with Strap  - 1 x daily - 3 reps - 30 sec  hold - Seated Hip External Rotation Stretch  - 1 x daily - 3 reps - 30-60 sec  hold  ASSESSMENT:  CLINICAL IMPRESSION: Patient is a 56 y.o. AA male who was seen today for physical therapy evaluation and treatment for left sided low back pain. He demonstrates signs and symptoms that place him in the movement control treatment based classification group with stable symptoms and low pain. He shows deficits that include decreased hip strength and flexibility and increased pain with lumbar flexion. Pt reports symptoms of lumbar radiculopathy, but straight leg raise negative. 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   OBJECTIVE IMPAIRMENTS: decreased ROM, decreased strength, hypomobility, impaired flexibility, and pain.   ACTIVITY LIMITATIONS: carrying, lifting, bending, standing, squatting, and stairs  PARTICIPATION LIMITATIONS: occupation  PERSONAL FACTORS: 1-2 comorbidities: DM and HTN  are also  affecting patient's functional outcome.   REHAB POTENTIAL: Good  CLINICAL DECISION MAKING: Stable/uncomplicated  EVALUATION COMPLEXITY: Low   GOALS: Goals reviewed with patient? No  SHORT TERM GOALS: Target date: 04/11/2023  Pt will be independent with HEP in order to improve strength and balance in order to decrease fall risk and improve function at home and work. Baseline: NT  Goal status: INITIAL   LONG TERM GOALS: Target date: 06/06/2023  Patient will have improved function and activity level as evidenced by an increase in FOTO score by >= 3 points or more.  Baseline: 69 Goal status: INITIAL  2.  Pt will show a decrease in NRPS >=3/10 while performing lifting and bending for job related tasks.  Baseline: NRPS 9/10  Goal status: INITIAL  3.  Pt will improve abdominal strength by 1/3 grade MMT (ie 4- to 4) for improved lumbar stability and decreased symptom response.  Baseline: NT  Goal status: INITIAL   PLAN:  PT FREQUENCY: 1-2x/week  PT DURATION: 10 weeks  PLANNED INTERVENTIONS:  Therapeutic exercises, Neuromuscular re-education, Balance training, Gait training, Patient/Family education, Joint mobilization, Joint manipulation, Aquatic Therapy, Dry Needling, Electrical stimulation, Spinal manipulation, Spinal mobilization, Cryotherapy, Moist heat, Traction, Manual therapy, and Re-evaluation.  PLAN FOR NEXT SESSION: Sahrmann Test, Progress hip and abdominal strengthening   Ellin Goodie PT, DPT  [facility] [address], [place], [address] Phone: [phone]   Fax:  [phone]

## 2023-03-27 NOTE — Telephone Encounter (Signed)
Medication Refill - Medication: tiZANidine (ZANAFLEX) 4 MG capsule [[id number]]   Has the patient contacted their pharmacy? Yes.   (Agent: If no, request that the patient contact the pharmacy for the refill. If patient does not wish to contact the pharmacy document the reason why and proceed with request.) (Agent: If yes, when and what did the pharmacy advise?)  Preferred Pharmacy (with phone number or street name):  [id number] [place], [place] - [address] Phone: [phone]  Fax: [phone]     Has the patient been seen for an appointment in the last year OR does the patient have an upcoming appointment? Yes.    Agent: Please be advised that RX refills may take up to 3 business days. We ask that you follow-up with your pharmacy.

## 2023-03-27 NOTE — Telephone Encounter (Signed)
Requested medication (s) are due for refill today - yes  Requested medication (s) are on the active medication list -yes  Future visit scheduled -yes  Last refill: 03/02/23 #30  Notes to clinic: non delegated Rx  Requested Prescriptions  Pending Prescriptions Disp Refills   tiZANidine (ZANAFLEX) 4 MG capsule [Pharmacy Med Name: TIZANIDINE HCL 4 MG CAPSULE] 30 capsule 0    Sig: TAKE 1 CAPSULE BY MOUTH 3 TIMES DAILY. *NONFORMULARY/NOT COVD*     Not Delegated - Cardiovascular:  Alpha-2 Agonists - tizanidine Failed - 03/25/2023  1:09 PM      Failed - This refill cannot be delegated      Passed - Valid encounter within last 6 months    Recent Outpatient Visits           1 week ago Essential (primary) hypertension   [place] [facility] Simmons-Robinson, [place], MD   3 weeks ago Lumbosacral spondylosis with radiculopathy   [place] [facility], Ocie Bob, MD   3 weeks ago Type 2 diabetes mellitus with hyperglycemia, with long-term current use of insulin (HCC)   [place] [facility] Simmons-Robinson, [place], MD   2 months ago Hyperlipidemia associated with type 2 diabetes mellitus (HCC)   [place] [facility] Simmons-Robinson, [place], MD   3 months ago Cellulitis of right lower extremity   [facility] Jacky Kindle, FNP       Future Appointments             In 4 weeks Simmons-Robinson, Tawanna Cooler, MD [facility]   In 1 month Ashley Royalty, Ocie Bob, MD [facility] at [facility], [facility]   In 2 months Simmons-Robinson, Tawanna Cooler, MD [facility], [facility]               Requested Prescriptions  Pending Prescriptions Disp Refills   tiZANidine (ZANAFLEX) 4 MG capsule [Pharmacy Med Name: TIZANIDINE HCL 4 MG CAPSULE] 30 capsule 0    Sig: TAKE 1 CAPSULE BY MOUTH 3 TIMES  DAILY. *NONFORMULARY/NOT COVD*     Not Delegated - Cardiovascular:  Alpha-2 Agonists - tizanidine Failed - 03/25/2023  1:09 PM      Failed - This refill cannot be delegated      Passed - Valid encounter within last 6 months    Recent Outpatient Visits           1 week ago Essential (primary) hypertension   [place] [facility] Simmons-Robinson, [place], MD   3 weeks ago Lumbosacral spondylosis with radiculopathy   [place] [facility], Ocie Bob, MD   3 weeks ago Type 2 diabetes mellitus with hyperglycemia, with long-term current use of insulin (HCC)   [place] [facility] Simmons-Robinson, [place], MD   2 months ago Hyperlipidemia associated with type 2 diabetes mellitus (HCC)   [place] [facility] Simmons-Robinson, [place], MD   3 months ago Cellulitis of right lower extremity    [place] [facility] Merita Norton T, FNP       Future Appointments             In 4 weeks Simmons-Robinson, Tawanna Cooler, MD [facility], [facility]   In 1 month Ashley Royalty, Ocie Bob, MD [facility] at [facility], [facility]   In 2  months Simmons-Robinson, Tawanna Cooler, MD [facility], [place]

## 2023-03-28 NOTE — Addendum Note (Signed)
Addended by: Johnn Hai on: 03/28/2023 09:33 AM   Modules accepted: Orders

## 2023-03-28 NOTE — Telephone Encounter (Signed)
Requested medications are due for refill today.  no  Requested medications are on the active medications list.  yes  Last refill. 03/28/2023 #30 1 rf  Future visit scheduled.   yes  Notes to clinic.  Refill/refusal not delegated.    Requested Prescriptions  Pending Prescriptions Disp Refills   tiZANidine (ZANAFLEX) 4 MG capsule 30 capsule 0    Sig: Take 1 capsule (4 mg total) by mouth 3 (three) times daily.     Not Delegated - Cardiovascular:  Alpha-2 Agonists - tizanidine Failed - 03/27/2023  9:30 AM      Failed - This refill cannot be delegated      Passed - Valid encounter within last 6 months    Recent Outpatient Visits           1 week ago Essential (primary) hypertension   [place] [facility] Simmons-Robinson, [place], MD   3 weeks ago Lumbosacral spondylosis with radiculopathy   [place] [facility], Ocie Bob, MD   3 weeks ago Type 2 diabetes mellitus with hyperglycemia, with long-term current use of insulin (HCC)   [place] [facility] Simmons-Robinson, [place], MD   2 months ago Hyperlipidemia associated with type 2 diabetes mellitus (HCC)   [place] [facility] Simmons-Robinson, [place], MD   3 months ago Cellulitis of right lower extremity   [place] [facility] Merita Norton T, FNP       Future Appointments             In 3 weeks Simmons-Robinson, Tawanna Cooler, MD [facility], [facility]   In 1 month Ashley Royalty, Ocie Bob, MD [facility] at [facility], [facility]   In 2 months Simmons-Robinson, Tawanna Cooler, MD [facility]

## 2023-03-30 ENCOUNTER — Ambulatory Visit: Payer: 59

## 2023-03-30 DIAGNOSIS — M5459 Other low back pain: Secondary | ICD-10-CM

## 2023-03-30 NOTE — Therapy (Signed)
OUTPATIENT PHYSICAL THERAPY TREATMENT   Patient Name: Charles HALLISEY [name] MRN: [id number] DOB:April 25, 1966, 57 y.o., male Today's Date: 03/30/2023  END OF SESSION:  PT End of Session - 03/30/23 1715     Visit Number 2    Number of Visits 20    Date for PT Re-Evaluation 06/05/23    Authorization Type [facility] 2024    Authorization - Visit Number 2    Authorization - Number of Visits 20    Progress Note Due on Visit 10    PT Start Time 1715    PT Stop Time 1755    PT Time Calculation (min) 40 min    Activity Tolerance Patient tolerated treatment well;No increased pain    Behavior During Therapy WFL for tasks assessed/performed             Past Medical History:  Diagnosis Date   Diabetes mellitus without complication (HCC)    Hypertension    Past Surgical History:  Procedure Laterality Date   COLONOSCOPY WITH PROPOFOL N/A 04/02/2021   Procedure: COLONOSCOPY WITH PROPOFOL;  Surgeon: Pasty Spillers, MD;  Location: [facility] ENDOSCOPY;  Service: Endoscopy;  Laterality: N/A;   COLONOSCOPY WITH PROPOFOL N/A 10/07/2022   Procedure: COLONOSCOPY WITH PROPOFOL;  Surgeon: Toney Reil, MD;  Location: [facility] ENDOSCOPY;  Service: Gastroenterology;  Laterality: N/A;   DISTAL BICEPS TENDON REPAIR Right 03/31/2015   Procedure: DISTAL BICEPS TENDON REPAIR;  Surgeon: Christena Flake, MD;  Location: [facility] ORS;  Service: Orthopedics;  Laterality: Right;   ELBOW SURGERY Right 1975   LEG SURGERY Right 1971   Patient Active Problem List   Diagnosis Date Noted   Essential (primary) hypertension 03/17/2023   Thigh pain, musculoskeletal, left 03/02/2023   Left lumbar pain 03/02/2023   Benign prostatic hyperplasia with urinary frequency 03/02/2023   Cellulitis of right lower extremity 11/22/2022   Hyperlipidemia associated with type 2 diabetes mellitus (HCC) 11/01/2022   Diabetes mellitus (HCC) 09/05/2022    PCP: Dr. Joseph Berkshire   REFERRING PROVIDER: Dr. Joseph Berkshire   REFERRING DIAG:   [phone] (ICD-10-CM) - Primary osteoarthritis of left hip  M47.27 (ICD-10-CM) - Lumbosacral spondylosis with radiculopathy    Rationale for Evaluation and Treatment: Rehabilitation  THERAPY DIAG:  Other low back pain  ONSET DATE: 2022   SUBJECTIVE:                                                                                                                                                                                           SUBJECTIVE STATEMENT: Just got off from work, no back pain today. He does feel tightness. HEP stretches seem  helpful.   PERTINENT HISTORY:  Pt reports that he first started to notice his low back pain right around when he had kidney stones. He had imaging done which showed degenerative disc disease. He saw orthopedist who found tight muscles in low back. 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 He reports that most of his low back pain starts after work.   PAIN:  Are you having pain? No pain, just some tightness today.    PRECAUTIONS: None   WEIGHT BEARING RESTRICTIONS: No  FALLS:  Has patient fallen in last 6 months? 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 In supine Rt ASIS more cranial, Left more caudal  Hip ROM:  -flexion: BLE WNL, but significant lateral deviation over 80 degrees flexion -Left hip ER: 32 degrees, Left hip IR: 22 degrees, Left hip ABDCT: 17  degrees -Right hip ER: 55 degrees, Right hip IR 22 degrees  -Prone Hip extension: 0 degrees Left, -5 degrees Right  -Palpation for Left low back spasm: pt guides author to left lower lateral most attachments to Iliocostalis:  excellent resolution of spastic bands with sustained ischemic release 1x90seconds at 2 places  -LLE long axis distraction of leg in open packed joint position 5x60sec   -Education on lunge hip extension stretch sparing lumbar spine stressors 10x5sec bilat -half kneeling version of this stretch 1x60sec bilat, arms supported on chair  -Pushing the sled: 350lbs 2x10 meters    03/27/23  Prone Quad Stretch 2 x 30 sec  Seated Hip ER Stretch on LLE 2 x 30 sec   PATIENT EDUCATION:  Education details: form and technique for correct performance of exercise  Person educated: Patient Education method: Explanation, Demonstration, Verbal cues, and Handouts Education comprehension: verbalized understanding and returned demonstration  HOME EXERCISE PROGRAM: Access Code: [email or web address] URL: [email or web address] Date: 03/27/2023 Prepared by: Ellin Goodie  Exercises - Prone Quadriceps Stretch with Strap  - 1 x daily - 3 reps - 30 sec  hold - Seated Hip External Rotation Stretch  - 1 x daily - 3 reps - 30-60 sec  hold  ASSESSMENT:  CLINICAL IMPRESSION: Finished assessment remaining from evaluation. 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 Discussed concerns with pt  about need to consult orthopedics for hip. Discussed ramifications of hip arthrofibrosis on altered lumbar spine/pelvis mechanics with ADL, work duties, Catering manager. Pt does well with interventions today, reports significant relief in pain and stiffness. WIll consider formal additional of new stretches if tolerated well long term. 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   OBJECTIVE IMPAIRMENTS: decreased ROM, decreased strength, hypomobility, impaired flexibility, and pain.   ACTIVITY LIMITATIONS: carrying, lifting, bending, standing, squatting, and stairs  PARTICIPATION LIMITATIONS: occupation  PERSONAL FACTORS: 1-2 comorbidities: DM and HTN  are also affecting patient's functional outcome.   REHAB POTENTIAL: Good  CLINICAL DECISION MAKING: Stable/uncomplicated  EVALUATION COMPLEXITY: Low   GOALS: Goals reviewed with patient? No  SHORT TERM GOALS: Target date: 04/11/2023  Pt will be independent with HEP in order to improve strength and balance in order to decrease fall risk and improve function at home and work. Baseline: NT  Goal status: INITIAL   LONG TERM GOALS: Target date: 06/06/2023  Patient will have improved function and activity level as evidenced by an increase in FOTO score by >= 3 points or more.  Baseline: 69 Goal status: INITIAL  2.  Pt will show a decrease in NRPS >=3/10 while performing lifting and bending for job related tasks.  Baseline: NRPS 9/10  Goal status: INITIAL  3.  Pt will improve abdominal strength by 1/3 grade MMT (ie 4- to 4) for improved lumbar stability and decreased symptom response.  Baseline: NT  Goal status: INITIAL   PLAN:  PT FREQUENCY: 1-2x/week  PT DURATION: 10 weeks  PLANNED INTERVENTIONS: Therapeutic exercises, Neuromuscular re-education, Balance training, Gait training, Patient/Family education, Joint mobilization, Joint manipulation, Aquatic Therapy, Dry Needling, Electrical stimulation, Spinal manipulation, Spinal mobilization, Cryotherapy, Moist heat, Traction, Manual therapy, and Re-evaluation.  PLAN FOR NEXT SESSION: Sahrmann Test, Progress hip and abdominal strengthening   5:17 PM, 03/30/23 Rosamaria Lints, PT, DPT Physical Therapist - [place] [phone] (Office)

## 2023-04-04 ENCOUNTER — Ambulatory Visit: Payer: 59 | Admitting: Physical Therapy

## 2023-04-06 ENCOUNTER — Ambulatory Visit: Payer: 59 | Admitting: Physical Therapy

## 2023-04-12 ENCOUNTER — Ambulatory Visit: Payer: 59 | Admitting: Physical Therapy

## 2023-04-17 ENCOUNTER — Ambulatory Visit: Payer: 59 | Admitting: Family Medicine

## 2023-04-18 ENCOUNTER — Ambulatory Visit: Payer: 59 | Attending: Family Medicine | Admitting: Physical Therapy

## 2023-04-18 DIAGNOSIS — M5459 Other low back pain: Secondary | ICD-10-CM | POA: Diagnosis present

## 2023-04-18 NOTE — Therapy (Unsigned)
OUTPATIENT PHYSICAL THERAPY TREATMENT   Patient Name: Charles DERRINGTON [name] MRN: [id number] DOB:08/23/1966, 57 y.o., male Today's Date: 04/18/2023  END OF SESSION:  PT End of Session - 04/18/23 1744     Visit Number 3    Number of Visits 20    Date for PT Re-Evaluation 06/05/23    Authorization Type [facility] 2024    Authorization - Visit Number 3    Authorization - Number of Visits 20    Progress Note Due on Visit 10    PT Start Time 1738    PT Stop Time 1816    PT Time Calculation (min) 38 min    Activity Tolerance Patient tolerated treatment well;No increased pain    Behavior During Therapy WFL for tasks assessed/performed              Past Medical History:  Diagnosis Date   Diabetes mellitus without complication (HCC)    Hypertension    Past Surgical History:  Procedure Laterality Date   COLONOSCOPY WITH PROPOFOL N/A 04/02/2021   Procedure: COLONOSCOPY WITH PROPOFOL;  Surgeon: Pasty Spillers, MD;  Location: [facility] ENDOSCOPY;  Service: Endoscopy;  Laterality: N/A;   COLONOSCOPY WITH PROPOFOL N/A 10/07/2022   Procedure: COLONOSCOPY WITH PROPOFOL;  Surgeon: Toney Reil, MD;  [facility] END[facility];  Service: Gastroenterology;  Laterality: N/A;   DISTAL BICEPS TENDON REPAIR Right 03/31/2015   Procedure: DISTAL BICEPS TENDON REPAIR;  Surgeon: Christena Flake, MD;  Location: [facility] ORS;  Service: Orthopedics;  Laterality: Right;   ELBOW SURGERY Right 1975   LEG SURGERY Right 1971   Patient Active Problem List   Diagnosis Date Noted   Essential (primary) hypertension 03/17/2023   Thigh pain, musculoskeletal, left 03/02/2023   Left lumbar pain 03/02/2023   Benign prostatic hyperplasia with urinary frequency 03/02/2023   Cellulitis of right lower extremity 11/22/2022   Hyperlipidemia associated with type 2 diabetes mellitus (HCC) 11/01/2022   Diabetes mellitus (HCC) 09/05/2022    PCP: Dr. Joseph Berkshire   REFERRING PROVIDER: Dr. Joseph Berkshire   REFERRING DIAG:   [id number] (ICD-10-CM) - Primary osteoarthritis of left hip  M47.27 (ICD-10-CM) - Lumbosacral spondylosis with radiculopathy    Rationale for Evaluation and Treatment: Rehabilitation  THERAPY DIAG:  Other low back pain  ONSET DATE: 2022   SUBJECTIVE:                                                                                                                                                                                           SUBJECTIVE STATEMENT: Pt has felt better as of late. He has been sleeping better after getting a  new mattress. He reports that he got an Estim for pain management of his low back and it has been helping him.   PERTINENT HISTORY:  Pt reports that he first started to notice his low back pain right around when he had kidney stones. He had imaging done which showed degenerative disc disease. He saw orthopedist who found tight muscles in low back. 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 He reports that most of his low back pain starts after work.   PAIN:  Are you having pain? No pain, just some tightness today.    PRECAUTIONS: None   WEIGHT BEARING RESTRICTIONS: No  FALLS:  Has patient fallen in last 6 months? 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   PLOF: Independent  PATIENT GOALS: To feel less low back pain when performing his job duties    NEXT MD VISIT: 04/2023   OBJECTIVE:   TODAY'S TREATMENT:                                                                                                                              DATE:   04/18/23: Matrix Recumbent Bike Seat at 11 with resistance at 3 for 6 min  Sahrmann: Level 5  Single Leg RDLs with #30 with 1 UE support 3 x 10  Forward Flexion 2 x 30 sec  Side Lying Diagonal Hip Abduction 2 x 10    03/30/23 Additional assessment:  -seated trunk rotation 80 degrees Right, 80 degrees Left (no pain with overpressure) -Apparent longer RLE in forward flexion  toe touch- pt reports as baseline phenomenon -98.5cm Left ASIS to MM; -100cm Rt ASIS to MM. In supine Rt ASIS more cranial, Left more caudal  Hip ROM:  -flexion: BLE WNL, but significant lateral deviation over 80 degrees flexion -Left hip ER: 32 degrees, Left hip IR: 22 degrees, Left hip ABDCT: 17 degrees -Right hip ER: 55 degrees, Right hip IR 22 degrees  -Prone Hip extension: 0 degrees Left, -5 degrees Right  -Palpation for Left low back spasm: pt guides author to left lower lateral most attachments to Iliocostalis:  excellent resolution of spastic bands with sustained ischemic release 1x90seconds at 2 places  -LLE long axis distraction of leg in open packed joint position 5x60sec   -Education on lunge hip extension stretch sparing lumbar spine stressors 10x5sec bilat -half kneeling version of this stretch 1x60sec bilat, arms supported on chair  -Pushing the sled: 350lbs 2x10 meters    03/27/23  Prone Quad Stretch 2 x 30 sec  Seated Hip ER Stretch on LLE 2 x 30 sec   PATIENT EDUCATION:  Education details: form and technique for correct performance of exercise  Person educated: Patient Education method: Explanation, Demonstration, Verbal cues, and Handouts Education comprehension: verbalized understanding and returned demonstration  HOME EXERCISE PROGRAM: Access Code: [email or web address] URL: [email or web address] Date: 04/18/2023 Prepared by: Ellin Goodie  Exercises - Prone Quadriceps  Stretch with Strap  - 1 x daily - 3 reps - 30 sec  hold - Seated Hip External Rotation Stretch  - 1 x daily - 3 reps - 30-60 sec  hold - Single-Leg [place] Deadlift With Kettlebell  - 3-4 x weekly - 3 sets - 10 reps - Sidelying Diagonal Hip Abduction  - 3-4 x weekly - 3 sets - 10 reps  ASSESSMENT:  CLINICAL IMPRESSION:  Finished assessment remaining from evaluation. 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 Discussed  concerns with pt about need to consult orthopedics for hip. Discussed ramifications of hip arthrofibrosis on altered lumbar spine/pelvis mechanics with ADL, work duties, Catering manager. Pt does well with interventions today, reports significant relief in pain and stiffness. WIll consider formal additional of new stretches if tolerated well long term. 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   OBJECTIVE IMPAIRMENTS: decreased ROM, decreased strength, hypomobility, impaired flexibility, and pain.   ACTIVITY LIMITATIONS: carrying, lifting, bending, standing, squatting, and stairs  PARTICIPATION LIMITATIONS: occupation  PERSONAL FACTORS: 1-2 comorbidities: DM and HTN  are also affecting patient's functional outcome.   REHAB POTENTIAL: Good  CLINICAL DECISION MAKING: Stable/uncomplicated  EVALUATION COMPLEXITY: Low   GOALS: Goals reviewed with patient? No  SHORT TERM GOALS: Target date: 04/11/2023  Pt will be independent with HEP in order to improve strength and balance in order to decrease fall risk and improve function at home and work. Baseline: NT  Goal status: ONGOING    LONG TERM GOALS: Target date: 06/06/2023  Patient will have improved function and activity level as evidenced by an increase in FOTO score by >= 3 points or more.  Baseline: 69 Goal status: ONGOING   2.  Pt will show a decrease in NRPS >=3/10 while performing lifting and bending for job related tasks.  Baseline: NRPS 9/10  Goal status: ONGOING   3.  Pt will improve abdominal strength by 1/3 grade MMT (ie 4- to 4) for improved lumbar stability and decreased symptom response.  Baseline: Level 5, Hip MMT all 5/5  Goal status: ACHIEVED    PLAN:  PT FREQUENCY: 1-2x/week  PT DURATION: 10 weeks  PLANNED INTERVENTIONS: Therapeutic exercises, Neuromuscular re-education, Balance training, Gait  training, Patient/Family education, Joint mobilization, Joint manipulation, Aquatic Therapy, Dry Needling, Electrical stimulation, Spinal manipulation, Spinal mobilization, Cryotherapy, Moist heat, Traction, Manual therapy, and Re-evaluation.  PLAN FOR NEXT SESSION: Sahrmann Test, Progress hip and abdominal strengthening   5:45 PM, 04/18/23 Rosamaria Lints, PT, DPT Physical Therapist - [place] [phone] (Office)

## 2023-04-24 ENCOUNTER — Ambulatory Visit (INDEPENDENT_AMBULATORY_CARE_PROVIDER_SITE_OTHER): Payer: 59 | Admitting: Family Medicine

## 2023-04-24 ENCOUNTER — Encounter: Payer: Self-pay | Admitting: Family Medicine

## 2023-04-24 VITALS — BP 164/93 | HR 101 | Temp 98.2°F | Ht 76.0 in | Wt 272.0 lb

## 2023-04-24 DIAGNOSIS — M545 Low back pain, unspecified: Secondary | ICD-10-CM | POA: Diagnosis not present

## 2023-04-24 DIAGNOSIS — Z794 Long term (current) use of insulin: Secondary | ICD-10-CM | POA: Diagnosis not present

## 2023-04-24 DIAGNOSIS — I10 Essential (primary) hypertension: Secondary | ICD-10-CM | POA: Diagnosis not present

## 2023-04-24 DIAGNOSIS — E1165 Type 2 diabetes mellitus with hyperglycemia: Secondary | ICD-10-CM

## 2023-04-24 NOTE — Assessment & Plan Note (Signed)
Patient reports chronic back pain and has been advised by chiropractor that hip replacement may be necessary in the future. Patient is managing pain with Clozanex 75mg  and a muscle relaxer as needed. -Continue current pain management regimen. F/u w/ orthopedic specialist as scheduled

## 2023-04-24 NOTE — Assessment & Plan Note (Addendum)
Patient is on Metformin 1000mg  twice daily, Lantus 12 units daily, and Trulicity 0.75mg  weekly. Patient reports increased appetite when Trulicity is missed. -Continue Trulicity 0.75mg  once weekly until Ozempic becomes available.  -Continue Metformin 1000mg  twice a day. -continue lantus 12 units daily  -check A1c in 1 month

## 2023-04-24 NOTE — Assessment & Plan Note (Signed)
>>  ASSESSMENT AND PLAN FOR LEFT LUMBAR PAIN WRITTEN ON 04/24/2023  4:24 PM BY Ronnald Ramp, MD  Patient reports chronic back pain and has been advised by chiropractor that hip replacement may be necessary in the future. Patient is managing pain with Clozanex 75mg  and a muscle relaxer as needed. -Continue current pain management regimen. F/u w/ orthopedic specialist as scheduled

## 2023-04-24 NOTE — Assessment & Plan Note (Signed)
Blood pressure elevated in office, but patient reports good control at home with Lisinopril-Hydrochlorothiazide 20/25mg . Patient did not take medication today prior to appointment. -Continue Lisinopril-Hydrochlorothiazide 20/25mg  daily. -Check blood pressure at home and report readings at next visit. BMP ordered today to assess K and renal function

## 2023-04-24 NOTE — Progress Notes (Signed)
Established patient visit   Patient: Charles GOLDSBERRY [name]   DOB: [birth year]   57 y.o. Male  MRN: [id number] Visit Date: 04/24/2023  Today's healthcare provider: Ronnald Ramp, MD   Chief Complaint  Patient presents with   Hypertension    Patient was last seen on 03/17/23.  At that time he was advised to double the dose of his Lisinopril hydrochlorothiazide from 10/12.5 to 20/25 . He reports his home readings have been ranging 120's-130's over mostly 80's.   Subjective     HPI     Hypertension    Additional comments: Patient was last seen on 03/17/23.  At that time he was advised to double the dose of his Lisinopril hydrochlorothiazide from 10/12.5 to 20/25 . He reports his home readings have been ranging 120's-130's over mostly 80's.      Last edited by Adline Peals, CMA on 04/24/2023  3:49 PM.       Discussed the use of AI scribe software for clinical note transcription with the patient, who gave verbal consent to proceed.  History of Present Illness    The patient, with a history of hypertension and diabetes, reports a positive response to the recent increase in lisinopril and hydrochlorothiazide, noting blood pressure readings averaging around 125/70-80 at home. However, the patient admits to a high-stress lifestyle with long work hours and minimal rest, which he believes may be contributing to his hypertension.  The patient also reports a brief illness last week with symptoms suggestive of a viral infection, which resolved with rest and over-the-counter remedies. He denies any recent changes in medication, except for a temporary switch from Ozempic to Trulicity for his diabetes management due to pharmacy supply issues. The patient expresses a preference for Ozempic, citing research suggesting better protection for major organs. He plans to resume Ozempic once it becomes available again.  The patient also reports an increase in appetite when not taking  Trulicity, but manages this by choosing healthier food options. He is also engaging in regular exercise and chiropractic treatments for back pain, which he believes is related to a leg length discrepancy from a childhood injury. The patient reports that his back pain is generally well-managed, but can flare up with sudden movements.  The patient's blood pressure was found to be elevated during the visit, which he attributes to not taking his medication that day. He expresses a commitment to continue working on lifestyle modifications, including weight loss and stress management, to improve his overall health.         Medications: Outpatient Medications Prior to Visit  Medication Sig   aspirin 325 MG tablet Take 325 mg by mouth daily.   atorvastatin (LIPITOR) 10 MG tablet Take 1 tablet (10 mg total) by mouth daily.   blood glucose meter kit and supplies Use glucose meter to test blood sugar once in the morning after fasting and 2 hours after largest meal of the day   chlorhexidine (PERIDEX) 0.12 % solution SMARTSIG:By Mouth   diclofenac (VOLTAREN) 75 MG EC tablet Take 1 tablet (75 mg total) by mouth 2 (two) times daily as needed.   Dulaglutide (TRULICITY) 0.75 MG/0.5ML SOPN Inject 0.75 mg into the skin once a week.   fluticasone (FLONASE) 50 MCG/ACT nasal spray Place 2 sprays into both nostrils daily.   glucose blood test strip Use as instructed to test blood sugar up to twice daily.   insulin glargine (LANTUS) 100 unit/mL SOPN Inject 12 Units into the  skin daily.   Insulin Pen Needle (SURE-FINE PEN NEEDLES) 31G X 8 MM MISC 1 Needle by Does not apply route daily.   lisinopril-hydrochlorothiazide (ZESTORETIC) 20-25 MG tablet Take 1 tablet by mouth daily.   meloxicam (MOBIC) 15 MG tablet Take 1 tablet (15 mg total) by mouth daily.   metFORMIN (GLUCOPHAGE) 1000 MG tablet Take 1 tablet (1,000 mg total) by mouth 2 (two) times daily with a meal.   tamsulosin (FLOMAX) 0.4 MG CAPS capsule Take 1  capsule (0.4 mg total) by mouth daily.   tiZANidine (ZANAFLEX) 4 MG capsule TAKE 1 CAPSULE BY MOUTH 3 TIMES DAILY. *NONFORMULARY/NOT COVD*   No facility-administered medications prior to visit.    Review of Systems  Last metabolic panel Lab Results  Component Value Date   GLUCOSE 289 (H) 01/02/2023   NA 139 01/02/2023   K 4.1 01/02/2023   CL 100 01/02/2023   CO2 22 01/02/2023   BUN 13 01/02/2023   CREATININE 1.11 01/02/2023   EGFR 76 01/02/2023   CALCIUM 9.5 01/02/2023   PROT 6.8 01/02/2023   ALBUMIN 4.1 01/02/2023   LABGLOB 2.7 01/02/2023   AGRATIO 1.5 01/02/2023   BILITOT 0.3 01/02/2023   ALKPHOS 87 01/02/2023   AST 21 01/02/2023   ALT 30 01/02/2023   ANIONGAP 10 03/15/2020        Objective    BP (!) 164/93 (BP Location: Left Arm, Patient Position: Sitting, Cuff Size: Large)   Pulse (!) 101   Temp 98.2 F (36.8 C) (Oral)   Ht 6\' 4"  (1.93 m)   Wt 272 lb (123.4 kg)   SpO2 99%   BMI 33.11 kg/m     Physical Exam Vitals reviewed.  Constitutional:      General: He is not in acute distress.    Appearance: Normal appearance. He is not ill-appearing, toxic-appearing or diaphoretic.  Eyes:     Conjunctiva/sclera: Conjunctivae normal.  Cardiovascular:     Rate and Rhythm: Normal rate and regular rhythm.     Pulses: Normal pulses.     Heart sounds: Normal heart sounds. No murmur heard.    No friction rub. No gallop.  Pulmonary:     Effort: Pulmonary effort is normal. No respiratory distress.     Breath sounds: Normal breath sounds. No stridor. No wheezing, rhonchi or rales.  Abdominal:     General: Bowel sounds are normal. There is no distension.     Palpations: Abdomen is soft.     Tenderness: There is no abdominal tenderness.  Musculoskeletal:     Right lower leg: No edema.     Left lower leg: No edema.  Skin:    Findings: No erythema or rash.  Neurological:     Mental Status: He is alert and oriented to person, place, and time.       No results  found for any visits on 04/24/23.  Assessment & Plan     Problem List Items Addressed This Visit     Diabetes mellitus (HCC)    Patient is on Metformin 1000mg  twice daily, Lantus 12 units daily, and Trulicity 0.75mg  weekly. Patient reports increased appetite when Trulicity is missed. -Continue Trulicity 0.75mg  once weekly until Ozempic becomes available.  -Continue Metformin 1000mg  twice a day. -continue lantus 12 units daily  -check A1c in 1 month       Essential (primary) hypertension - Primary    Blood pressure elevated in office, but patient reports good control at home with Lisinopril-Hydrochlorothiazide 20/25mg . Patient did  not take medication today prior to appointment. -Continue Lisinopril-Hydrochlorothiazide 20/25mg  daily. -Check blood pressure at home and report readings at next visit. BMP ordered today to assess K and renal function       Relevant Orders   BMP8+EGFR   Left lumbar pain    Patient reports chronic back pain and has been advised by chiropractor that hip replacement may be necessary in the future. Patient is managing pain with Clozanex 75mg  and a muscle relaxer as needed. -Continue current pain management regimen. F/u w/ orthopedic specialist as scheduled         Return in about 1 month (around 05/25/2023) for DM, HTN.       Ronnald Ramp, MD  [facility] [phone] (phone) [phone] (fax)  [facility]

## 2023-04-25 LAB — BMP8+EGFR
BUN/Creatinine Ratio: 7 — ABNORMAL LOW (ref 9–20)
BUN: 7 mg/dL (ref 6–24)
CO2: 22 mmol/L (ref 20–29)
Calcium: 9.7 mg/dL (ref 8.7–10.2)
Chloride: 100 mmol/L (ref 96–106)
Creatinine, Ser: 1.04 mg/dL (ref 0.76–1.27)
Glucose: 290 mg/dL — ABNORMAL HIGH (ref 70–99)
Potassium: 4.1 mmol/L (ref 3.5–5.2)
Sodium: 138 mmol/L (ref 134–144)
eGFR: 83 mL/min/{1.73_m2} (ref >59)

## 2023-04-26 ENCOUNTER — Ambulatory Visit: Payer: 59 | Admitting: Physical Therapy

## 2023-04-26 DIAGNOSIS — M5459 Other low back pain: Secondary | ICD-10-CM

## 2023-04-26 NOTE — Therapy (Unsigned)
OUTPATIENT PHYSICAL THERAPY TREATMENT   Patient Name: Charles KOSSMAN [name] MRN: [id number] DOB:[birth year], 57 y.o., male Today's Date: 04/27/2023  END OF SESSION:  PT End of Session - 04/26/23 1848     Visit Number 4    Number of Visits 20    Date for PT Re-Evaluation 06/05/23    Authorization Type [facility] 2024    Authorization - Visit Number 4    Authorization - Number of Visits 20    Progress Note Due on Visit 10    PT Start Time 1800    PT Stop Time 1845    PT Time Calculation (min) 45 min    Activity Tolerance Patient tolerated treatment well;No increased pain    Behavior During Therapy WFL for tasks assessed/performed               Past Medical History:  Diagnosis Date   Diabetes mellitus without complication (HCC)    Hypertension    Past Surgical History:  Procedure Laterality Date   COLONOSCOPY WITH PROPOFOL N/A 04/02/2021   Procedure: COLONOSCOPY WITH PROPOFOL;  Surgeon: Pasty Spillers, MD;  Location: [facility] ENDOSCOPY;  Service: Endoscopy;  Laterality: N/A;   COLONOSCOPY WITH PROPOFOL N/A 10/07/2022   Procedure: COLONOSCOPY WITH PROPOFOL;  Surgeon: Toney Reil, MD;  Location: [facility] ENDOSCOPY;  Service: Gastroenterology;  Laterality: N/A;   DISTAL BICEPS TENDON REPAIR Right 03/31/2015   Procedure: DISTAL BICEPS TENDON REPAIR;  Surgeon: Christena Flake, MD;  Location: [facility] ORS;  Service: Orthopedics;  Laterality: Right;   ELBOW SURGERY Right 1975   LEG SURGERY Right 1971   Patient Active Problem List   Diagnosis Date Noted   Essential (primary) hypertension 03/17/2023   Thigh pain, musculoskeletal, left 03/02/2023   Left lumbar pain 03/02/2023   Benign prostatic hyperplasia with urinary frequency 03/02/2023   Cellulitis of right lower extremity 11/22/2022   Hyperlipidemia associated with type 2 diabetes mellitus (HCC) 11/01/2022   Diabetes mellitus (HCC) 09/05/2022    PCP: Dr. Joseph Berkshire   REFERRING PROVIDER: Dr. Joseph Berkshire   REFERRING DIAG:   [phone] (ICD-10-CM) - Primary osteoarthritis of left hip  M47.27 (ICD-10-CM) - Lumbosacral spondylosis with radiculopathy    Rationale for Evaluation and Treatment: Rehabilitation  THERAPY DIAG:  Other low back pain  ONSET DATE: 2022   SUBJECTIVE:                                                                                                                                                                                           SUBJECTIVE STATEMENT: Pt continues to feel an improvement in his low back and hip. He is  also doing well with his exercises.   PERTINENT HISTORY:  Pt reports that he first started to notice his low back pain right around when he had kidney stones. He had imaging done which showed degenerative disc disease. He saw orthopedist who found tight muscles in low back. 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 He reports that most of his low back pain starts after work.   PAIN:  Are you having pain? No pain, just some tightness today.    PRECAUTIONS: None   WEIGHT BEARING RESTRICTIONS: No  FALLS:  Has patient fallen in last 6 months? 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 There is normal alignment. There is no evidence for acute fracture or subluxation. There is moderate multilevel intervertebral disc space height loss with multilevel endplate proliferative changes, most pronounced at L5-S1. Facet arthropathy. Atherosclerotic calcifications. Degenerative changes of the  LEFT hip.   IMPRESSION: Moderate multilevel degenerative changes of the lumbar spine most pronounced at L5-S1.     Electronically Signed   By: Meda Klinefelter M.D.   On: 11/29/2022 14:08     PATIENT SURVEYS:  FOTO 69/100 with target of 72    SCREENING FOR RED FLAGS: Bowel or bladder incontinence: No Spinal tumors: No Cauda equina syndrome: No Compression fracture: No Abdominal aneurysm: No   COGNITION: Overall cognitive status: Within functional limits for tasks assessed                          SENSATION: WFL   MUSCLE LENGTH: Hamstrings: Right 90 deg; Left 90 deg Thomas test: NT    POSTURE: No Significant postural limitations   PALPATION: Left side of L5 vertebrae TTP    LUMBAR ROM:    AROM eval  Flexion 100%  Extension 100%  Right lateral flexion 100%  Left lateral flexion 100%  Right rotation 100%  Left rotation 100%   (Blank rows = not tested)   LOWER EXTREMITY ROM:      Active  Right eval Left eval  Hip flexion 120 120  Hip extension      Hip abduction      Hip adduction      Hip internal rotation      Hip external rotation      Knee flexion      Knee extension      Ankle dorsiflexion      Ankle plantarflexion      Ankle inversion      Ankle eversion       (Blank rows = not tested)                LOWER EXTREMITY MMT:     MMT Right eval Left eval  Hip flexion 4+ 4+  Hip extension 4 4  Hip abduction 4 4  Hip adduction      Hip internal rotation      Hip external rotation  Knee flexion 4+ 4+  Knee extension 4+ 4+  Ankle dorsiflexion 4+ 4+  Ankle plantarflexion      Ankle inversion      Ankle eversion       (Blank rows = not tested)   LUMBAR SPECIAL TESTS:  Straight leg raise test: Negative       GAIT: Distance walked: 50 ft  Assistive device utilized: None Level of assistance: Complete Independence Comments: No gait deficits noticed     TODAY'S TREATMENT:                                                                                                                               DATE:   04/26/23: THEREX   Matrix Recumbent Bike Seat at 11 with resistance at 3 for 6 min  Plank 2 x 30 sec Plank with Hip Extension 2 X 5  -Pt reports increased left sided back pain Left Sided Plank 1 x 30 sec  -Pt reports increased left sided back pain Pallof Press #15 3 x 10  Child's Pose Right Flexion 2 x 30 sec  3 Way [employer], Left Side and Right side 3 x 10 reps   MANUAL  Palpation: Left paraspinal taut and Ttp MFR of left paraspinal over thoracic region    04/18/23: Matrix Recumbent Bike Seat at 11 with resistance at 3 for 6 min  [place]ann: Level 5  Single Leg RDLs with #30 with 1 UE support 3 x 10  Forward Flexion 2 x 30 sec  Side Lying Hip Abduction 2 x 10  Side Lying Diagonal Hip Abduction 2 x 10   03/30/23 Additional assessment:  -seated trunk rotation 80 degrees Right, 80 degrees Left (no pain with overpressure) -Apparent longer RLE in forward flexion toe touch- pt reports as baseline phenomenon -98.5cm Left ASIS to MM; -100cm Rt ASIS to MM. In supine Rt ASIS more cranial, Left more caudal  Hip ROM:  -flexion: BLE WNL, but significant lateral deviation over 80 degrees flexion -Left hip ER: 32 degrees, Left hip IR: 22 degrees, Left hip ABDCT: 17 degrees -Right hip ER: 55 degrees, Right hip IR 22 degrees  -Prone Hip extension: 0 degrees Left, -5 degrees Right  -Palpation for Left low back spasm: pt guides author to left lower lateral most attachments to Iliocostalis:  excellent resolution of spastic bands with sustained ischemic release 1x90seconds at 2 places  -LLE long axis distraction of leg in open packed joint position 5x60sec   -Education on lunge hip extension stretch sparing lumbar spine stressors 10x5sec bilat -half kneeling version of this stretch 1x60sec bilat, arms supported on chair  -Pushing the sled: 350lbs 2x10 meters    03/27/23  Prone Quad Stretch 2 x 30  sec  Seated Hip ER Stretch on LLE 2 x 30 sec   PATIENT EDUCATION:  Education details: form and technique for correct performance of exercise  Person educated: Patient Education method: Explanation, Demonstration, Verbal cues,  and Handouts Education comprehension: verbalized understanding and returned demonstration  HOME EXERCISE PROGRAM: Access Code: [email or web address] URL: [email or web address] Date: 04/26/2023 Prepared by: Charles [name]  Exercises - Seated 3 Way Exercise Ball Roll Out Stretch  - 1 x daily - 3 sets - 10 reps - Prone Quadriceps Stretch with Strap  - 1 x daily - 3 reps - 30 sec  hold - Seated Hip External Rotation Stretch  - 1 x daily - 3 reps - 30-60 sec  hold - Single-Leg [place] Deadlift With Kettlebell  - 3-4 x weekly - 3 sets - 10 reps - Sidelying Diagonal Hip Abduction  - 3-4 x weekly - 3 sets - 10 reps - Standard Plank  - 1 x daily - 3 reps - 30 sec  hold  ASSESSMENT:  CLINICAL IMPRESSION: Pt limited by pain during session especially with exercises that stress lumbar extension and rotation like side planks. Modified exercises to only include neutral spine positions. Pt also has palpable left paraspinal that is over thoracic region and it cannot be dry needled. Will attempt further massage and myofascial release which pt reports helped significantly. Pt also able to perform stretches for paraspinals without compensation for added relief of muscular tension. 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    OBJECTIVE IMPAIRMENTS: decreased ROM, decreased strength, hypomobility, impaired flexibility, and pain.   ACTIVITY LIMITATIONS: carrying, lifting, bending, standing, squatting, and stairs  PARTICIPATION LIMITATIONS: occupation  PERSONAL FACTORS: 1-2 comorbidities: DM and HTN  are also affecting patient's functional outcome.    REHAB POTENTIAL: Good  CLINICAL DECISION MAKING: Stable/uncomplicated  EVALUATION COMPLEXITY: Low   GOALS: Goals reviewed with patient? No  SHORT TERM GOALS: Target date: 04/11/2023  Pt will be independent with HEP in order to improve strength and balance in order to decrease fall risk and improve function at home and work. Baseline: NT 04/19/23: Able to perform independently  Goal status: ACHIEVED    LONG TERM GOALS: Target date: 06/06/2023  Patient will have improved function and activity level as evidenced by an increase in FOTO score by >= 3 points or more.  Baseline: 69 Goal status: ONGOING   2.  Pt will show a decrease in NRPS >=3/10 while performing lifting and bending for job related tasks.  Baseline: NRPS 9/10  Goal status: ONGOING   3.  Pt will improve abdominal strength by 1/3 grade MMT (ie 4- to 4) for improved lumbar stability and decreased symptom response.  Baseline: Level 5, Hip MMT all 5/5  Goal status: ACHIEVED    PLAN:  PT FREQUENCY: 1-2x/week  PT DURATION: 10 weeks  PLANNED INTERVENTIONS: Therapeutic exercises, Neuromuscular re-education, Balance training, Gait training, Patient/Family education, Joint mobilization, Joint manipulation, Aquatic Therapy, Dry Needling, Electrical stimulation, Spinal manipulation, Spinal mobilization, Cryotherapy, Moist heat, Traction, Manual therapy, and Re-evaluation.  PLAN FOR NEXT SESSION: Progress hip and abdominal strengthening exercises:  Spiderman's, [place] split squat    Charles [name] PT, DPT  [facility] [address], [place], [address] Phone: [phone]   Fax:  [phone]

## 2023-05-01 ENCOUNTER — Encounter: Payer: Self-pay | Admitting: Family Medicine

## 2023-05-01 ENCOUNTER — Ambulatory Visit (INDEPENDENT_AMBULATORY_CARE_PROVIDER_SITE_OTHER): Payer: 59 | Admitting: Family Medicine

## 2023-05-01 VITALS — BP 138/84 | HR 97 | Ht 76.0 in | Wt 273.0 lb

## 2023-05-01 DIAGNOSIS — M4727 Other spondylosis with radiculopathy, lumbosacral region: Secondary | ICD-10-CM

## 2023-05-01 DIAGNOSIS — M25562 Pain in left knee: Secondary | ICD-10-CM | POA: Diagnosis not present

## 2023-05-01 DIAGNOSIS — G8929 Other chronic pain: Secondary | ICD-10-CM | POA: Diagnosis not present

## 2023-05-01 DIAGNOSIS — M1612 Unilateral primary osteoarthritis, left hip: Secondary | ICD-10-CM

## 2023-05-01 DIAGNOSIS — M79652 Pain in left thigh: Secondary | ICD-10-CM

## 2023-05-01 DIAGNOSIS — M545 Low back pain, unspecified: Secondary | ICD-10-CM

## 2023-05-03 ENCOUNTER — Other Ambulatory Visit: Payer: Self-pay | Admitting: Family Medicine

## 2023-05-03 ENCOUNTER — Ambulatory Visit: Payer: 59 | Admitting: Physical Therapy

## 2023-05-03 DIAGNOSIS — M1612 Unilateral primary osteoarthritis, left hip: Secondary | ICD-10-CM

## 2023-05-03 DIAGNOSIS — M4727 Other spondylosis with radiculopathy, lumbosacral region: Secondary | ICD-10-CM

## 2023-05-04 NOTE — Telephone Encounter (Signed)
Requested medication (s) are due for refill today: yes   Requested medication (s) are on the active medication list: yes   Last refill:  03/06/23 #60 1 refills  Future visit scheduled: no   Notes to clinic:  protocol failed last labs 11/18/21. Do you want to refill Rx?     Requested Prescriptions  Pending Prescriptions Disp Refills   diclofenac (VOLTAREN) 75 MG EC tablet [Pharmacy Med Name: DICLOFENAC SOD EC 75 MG TAB] 60 tablet 1    Sig: TAKE 1 TABLET BY MOUTH 2 TIMES DAILY AS NEEDED.     Analgesics:  NSAIDS Failed - 05/03/2023  2:34 AM      Failed - Manual Review: Labs are only required if the patient has taken medication for more than 8 weeks.      Failed - HGB in normal range and within 360 days    Hemoglobin  Date Value Ref Range Status  11/18/2021 15.1 13.0 - 17.7 g/dL Final         Failed - PLT in normal range and within 360 days    Platelets  Date Value Ref Range Status  11/18/2021 244 150 - 450 x10E3/uL Final         Failed - HCT in normal range and within 360 days    Hematocrit  Date Value Ref Range Status  11/18/2021 45.9 37.5 - 51.0 % Final         Passed - Cr in normal range and within 360 days    Creatinine  Date Value Ref Range Status  04/25/2014 1.11 0.60 - 1.30 mg/dL Final   Creatinine, Ser  Date Value Ref Range Status  04/24/2023 1.04 0.76 - 1.27 mg/dL Final         Passed - eGFR is 30 or above and within 360 days    EGFR (African American)  Date Value Ref Range Status  04/25/2014 >60  Final   GFR calc Af Amer  Date Value Ref Range Status  03/15/2020 >60 >60 mL/min Final   EGFR (Non-African Amer.)  Date Value Ref Range Status  04/25/2014 >60  Final    Comment:    eGFR values <39mL/min/1.73 m2 may be an indication of chronic kidney disease (CKD). Calculated eGFR is useful in patients with stable renal function. The eGFR calculation will not be reliable in acutely ill patients when serum creatinine is changing rapidly. It is not useful in   patients on dialysis. The eGFR calculation may not be applicable to patients at the low and high extremes of body sizes, pregnant women, and vegetarians.    GFR calc non Af Amer  Date Value Ref Range Status  03/15/2020 >60 >60 mL/min Final   eGFR  Date Value Ref Range Status  04/24/2023 83 >59 mL/min/1.73 Final         Passed - Patient is not pregnant      Passed - Valid encounter within last 12 months    Recent Outpatient Visits           3 days ago Primary osteoarthritis of left hip   [facility] Loron, Ocie Bob, MD   1 week ago Essential (primary) hypertension   [facility] Simmons-Robinson, [place], MD   1 month ago Essential (primary) hypertension   [place] Simmons-Robinson, Tawanna Cooler, MD   1 month ago Lumbosacral spondylosis with radiculopathy   [facility]  [place] [name], MD   2 months ago Type 2 diabetes mellitus with hyperglycemia, with long-term current use of insulin [facility])   [place] [facility] Ronnald Ramp, MD

## 2023-05-04 NOTE — Telephone Encounter (Signed)
Please review.  KP

## 2023-05-05 DIAGNOSIS — G8929 Other chronic pain: Secondary | ICD-10-CM | POA: Insufficient documentation

## 2023-05-05 MED ORDER — DICLOFENAC SODIUM 75 MG PO TBEC
75.0000 mg | DELAYED_RELEASE_TABLET | Freq: Two times a day (BID) | ORAL | 1 refills | Status: DC | PRN
Start: 2023-05-05 — End: 2023-07-04

## 2023-05-05 NOTE — Assessment & Plan Note (Signed)
Had a few sessions of physical therapy with overall improvement in low back / spine pain.  - Encouraged PT - PRN diclofenac - If persistent, referral to interventional spine considered

## 2023-05-05 NOTE — Assessment & Plan Note (Signed)
Left knee acute on chronic pain, in the setting of ipsilateral left hip OA and lumbosacral spondylosis. Offered imaging, CSI, patient would like to continue with current regimen and follow-up PRN

## 2023-05-05 NOTE — Progress Notes (Signed)
     Primary Care / Sports Medicine Office Visit  Patient Information:  Patient ID: Charles [name]., male DOB: 1966-02-15 Age: 57 y.o. MRN: [id number]   Charles VANWAGNER [name] is a pleasant 58 y.o. male presenting with the following:  Chief Complaint  Patient presents with   Lumbosacral spondylosis with radiculopathy    Vitals:   05/01/23 1557  BP: 138/84  Pulse: 97  SpO2: 97%   Vitals:   05/01/23 1557  Weight: 273 lb (123.8 kg)  Height: 6\' 4"  (1.93 m)   Body mass index is 33.23 kg/m.  No results found.   Independent interpretation of notes and tests performed by another provider:   None  Procedures performed:   None  Pertinent History, Exam, Impression, and Recommendations:   Charles [name] was seen today for lumbosacral spondylosis with radiculopathy.  Primary osteoarthritis of left hip Assessment & Plan: Left hip mild OA, despite radiographic evidence of severity, his groin remains symptomatic and he has +FADIR that recreates pain. Discussed possible CSI vs alternate treatments. He would like to continue PT and PRN diclofenac.  Orders: -     Diclofenac Sodium; Take 1 tablet (75 mg total) by mouth 2 (two) times daily as needed.  Dispense: 60 tablet; Refill: 1  Lumbosacral spondylosis with radiculopathy Assessment & Plan: Had a few sessions of physical therapy with overall improvement in low back / spine pain.  - Encouraged PT - PRN diclofenac - If persistent, referral to interventional spine considered  Orders: -     Diclofenac Sodium; Take 1 tablet (75 mg total) by mouth 2 (two) times daily as needed.  Dispense: 60 tablet; Refill: 1  Chronic pain of left knee Assessment & Plan: Left knee acute on chronic pain, in the setting of ipsilateral left hip OA and lumbosacral spondylosis. Offered imaging, CSI, patient would like to continue with current regimen and follow-up PRN      Orders & Medications Meds ordered this encounter  Medications   diclofenac  (VOLTAREN) 75 MG EC tablet    Sig: Take 1 tablet (75 mg total) by mouth 2 (two) times daily as needed.    Dispense:  60 tablet    Refill:  1   No orders of the defined types were placed in this encounter.    No follow-ups on file.     Charles Banana, MD, [facility]   Primary Care Sports Medicine Primary Care and Sports Medicine at [facility]

## 2023-05-05 NOTE — Assessment & Plan Note (Signed)
Left hip mild OA, despite radiographic evidence of severity, his groin remains symptomatic and he has +FADIR that recreates pain. Discussed possible CSI vs alternate treatments. He would like to continue PT and PRN diclofenac.

## 2023-05-10 ENCOUNTER — Telehealth: Payer: Self-pay | Admitting: Physical Therapy

## 2023-05-10 ENCOUNTER — Ambulatory Visit: Payer: 59 | Attending: Family Medicine | Admitting: Physical Therapy

## 2023-05-10 NOTE — Telephone Encounter (Signed)
Called pt to inquire about pt's absence. Reached pt's wife who said he cannot make apt today and he cannot make appointments next week. PT reminded pt about next apt after her returns from week long break.

## 2023-05-15 ENCOUNTER — Ambulatory Visit: Payer: 59 | Admitting: Physical Therapy

## 2023-05-17 ENCOUNTER — Encounter: Payer: 59 | Admitting: Physical Therapy

## 2023-05-21 ENCOUNTER — Other Ambulatory Visit: Payer: Self-pay | Admitting: Family Medicine

## 2023-05-21 DIAGNOSIS — E1169 Type 2 diabetes mellitus with other specified complication: Secondary | ICD-10-CM

## 2023-05-22 ENCOUNTER — Telehealth: Payer: Self-pay | Admitting: Physical Therapy

## 2023-05-22 ENCOUNTER — Ambulatory Visit: Payer: 59 | Admitting: Physical Therapy

## 2023-05-22 NOTE — Telephone Encounter (Signed)
Called pt to inquire about absence from PT. Did not reach so left VM instructing pt to call back to reschedule and that given there have been repeated no shows that he would be taken off schedule if it happened again per the attendance policy.

## 2023-05-24 ENCOUNTER — Ambulatory Visit: Payer: 59 | Admitting: Physical Therapy

## 2023-05-29 ENCOUNTER — Ambulatory Visit: Payer: 59 | Admitting: Physical Therapy

## 2023-05-31 ENCOUNTER — Ambulatory Visit: Payer: 59 | Admitting: Physical Therapy

## 2023-06-05 ENCOUNTER — Ambulatory Visit: Payer: 59 | Admitting: Family Medicine

## 2023-06-28 ENCOUNTER — Other Ambulatory Visit: Payer: Self-pay | Admitting: Family Medicine

## 2023-06-28 DIAGNOSIS — M4727 Other spondylosis with radiculopathy, lumbosacral region: Secondary | ICD-10-CM

## 2023-06-28 DIAGNOSIS — M1612 Unilateral primary osteoarthritis, left hip: Secondary | ICD-10-CM

## 2023-06-29 NOTE — Telephone Encounter (Signed)
Requested medication (s) are due for refill today:   Yes  Requested medication (s) are on the active medication list:   Yes  Future visit scheduled:   No   LOV 05/05/2023   Last ordered: 05/05/2023 #60, 1 refill  Returned because labs are due in order to give a refill.     Requested Prescriptions  Pending Prescriptions Disp Refills   diclofenac (VOLTAREN) 75 MG EC tablet [Pharmacy Med Name: DICLOFENAC SOD EC 75 MG TAB] 60 tablet 1    Sig: TAKE 1 TABLET BY MOUTH 2 TIMES DAILY AS NEEDED.     Analgesics:  NSAIDS Failed - 06/28/2023  2:23 AM      Failed - Manual Review: Labs are only required if the patient has taken medication for more than 8 weeks.      Failed - HGB in normal range and within 360 days    Hemoglobin  Date Value Ref Range Status  11/18/2021 15.1 13.0 - 17.7 g/dL Final         Failed - PLT in normal range and within 360 days    Platelets  Date Value Ref Range Status  11/18/2021 244 150 - 450 x10E3/uL Final         Failed - HCT in normal range and within 360 days    Hematocrit  Date Value Ref Range Status  11/18/2021 45.9 37.5 - 51.0 % Final         Passed - Cr in normal range and within 360 days    Creatinine  Date Value Ref Range Status  04/25/2014 1.11 0.60 - 1.30 mg/dL Final   Creatinine, Ser  Date Value Ref Range Status  04/24/2023 1.04 0.76 - 1.27 mg/dL Final         Passed - eGFR is 30 or above and within 360 days    EGFR (African American)  Date Value Ref Range Status  04/25/2014 >60  Final   GFR calc Af Amer  Date Value Ref Range Status  03/15/2020 >60 >60 mL/min Final   EGFR (Non-African Amer.)  Date Value Ref Range Status  04/25/2014 >60  Final    Comment:    eGFR values <20mL/min/1.73 m2 may be an indication of chronic kidney disease (CKD). Calculated eGFR is useful in patients with stable renal function. The eGFR calculation will not be reliable in acutely ill patients when serum creatinine is changing rapidly. It is not useful in   patients on dialysis. The eGFR calculation may not be applicable to patients at the low and high extremes of body sizes, pregnant women, and vegetarians.    GFR calc non Af Amer  Date Value Ref Range Status  03/15/2020 >60 >60 mL/min Final   eGFR  Date Value Ref Range Status  04/24/2023 83 >59 mL/min/1.73 Final         Passed - Patient is not pregnant      Passed - Valid encounter within last 12 months    Recent Outpatient Visits           1 month ago Primary osteoarthritis of left hip   [place] [facility], Ocie Bob, MD   2 months ago Essential (primary) hypertension   [place] [facility] Simmons-Robinson, [place], MD   3 months ago Essential (primary) hypertension   [place] [facility], Tawanna [place], MD   3 months ago Lumbosacral spondylosis with radiculopathy   [facility]  Med[facility] at [facility], Ocie Bob, MD   3 months ago Type 2 diabetes mellitus with hyperglycemia, with long-term current use of insulin [facility])    [place] [facility] Ronnald Ramp, MD

## 2023-07-04 ENCOUNTER — Ambulatory Visit: Payer: Self-pay

## 2023-07-04 NOTE — Telephone Encounter (Signed)
  Summary: medication     Please call Charles [name] regarding: insulin glargine (LANTUS) 100 unit/mL SOPN ,,, she has questions and need clarification     Reason for Disposition  [1] Prescription refill request for ESSENTIAL medicine (i.e., likelihood of harm to patient if not taken) AND [2] triager unable to refill per department policy  Answer Assessment - Initial Assessment Questions 1. DRUG NAME: "What medicine do you need to have refilled?"     Insulin glargine (LANTUS) 100 unit/ mL 2. REFILLS REMAINING: "How many refills are remaining?" (Note: The label on the medicine or pill bottle will show how many refills are remaining. If there are no refills remaining, then a renewal may be needed.)     None 3. EXPIRATION DATE: "What is the expiration date?" (Note: The label states when the prescription will expire, and thus can no longer be refilled.)     unknown 4. PRESCRIBING HCP: "Who prescribed it?" Reason: If prescribed by specialist, call should be referred to that group.     Historical prescriber listed in chart  5. SYMPTOMS: "Do you have any symptoms?"     None  Protocols used: Medication Refill and Renewal Call-A-AH

## 2023-07-04 NOTE — Telephone Encounter (Signed)
Requested medication (s) are due for refill today: Yes  Requested medication (s) are on the active medication list: Yes  Last refill:  03/21/23  Future visit scheduled: No  Notes to clinic:  Unable to refill per protocol, last refill by another provider.      Requested Prescriptions  Pending Prescriptions Disp Refills   insulin glargine (LANTUS) 100 unit/mL SOPN 15 mL     Sig: Inject 12 Units into the skin daily.     Endocrinology:  Diabetes - Insulins Failed - 07/04/2023 10:41 AM      Failed - HBA1C is between 0 and 7.9 and within 180 days    Hemoglobin A1C  Date Value Ref Range Status  03/02/2023 9.1 (A) 4.0 - 5.6 % Final   Hgb A1c MFr Bld  Date Value Ref Range Status  09/06/2022 12.0 (H) 4.8 - 5.6 % Final    Comment:             Prediabetes: 5.7 - 6.4          Diabetes: >6.4          Glycemic control for adults with diabetes: <7.0          Passed - Valid encounter within last 6 months    Recent Outpatient Visits           2 months ago Primary osteoarthritis of left hip   [place] [facility] at [facility], Ocie Bob, MD   2 months ago Essential (primary) hypertension   [place] [facility], [place], MD   3 months ago Essential (primary) hypertension   [place] [facility]-Rob[facility], [employer], MD   4 months ago Lumbosacral spondylosis with radiculopathy   [place] [facility] Medicine at [facility], Ocie Bob, MD   4 months ago Type 2 diabetes mellitus with hyperglycemia, with long-term current use of insulin [facility])   [place] [facility] Ronnald Ramp, MD

## 2023-07-04 NOTE — Telephone Encounter (Signed)
Patient called, left VM to return the call to the office to speak to the NT.    Summary: medication   Please call Charles [name] regarding: insulin glargine (LANTUS) 100 unit/mL SOPN ,,, she has questions and need clarification

## 2023-07-05 ENCOUNTER — Other Ambulatory Visit: Payer: Self-pay | Admitting: Family Medicine

## 2023-07-05 DIAGNOSIS — E1165 Type 2 diabetes mellitus with hyperglycemia: Secondary | ICD-10-CM

## 2023-07-05 MED ORDER — INSULIN GLARGINE 100 UNITS/ML SOLOSTAR PEN: 12.0000 [IU] | PEN_INJECTOR | Freq: Every day | SUBCUTANEOUS | 6 refills | Status: DC

## 2023-07-05 NOTE — Telephone Encounter (Signed)
Last refill by historical provider  Please advise

## 2023-07-06 NOTE — Telephone Encounter (Signed)
Requested medications are due for refill today.  See note  Requested medications are on the active medications list.  yes  Last refill. 07/05/2023 - ordered not filled  Future visit scheduled.   no  Notes to clinic.  Pharmacy comment: Alternative Requested:THE PRESCRIBED MEDICATION IS NOT COVERED BY INSURANCE. PLEASE CONSIDER CHANGING TO ONE OF THE SUGGESTED COVERED ALTERNATIVES.    Requested Prescriptions  Pending Prescriptions Disp Refills   Insulin Glargine (BASAGLAR KWIKPEN) 100 UNIT/ML [Pharmacy Med Name: BASAGLAR 100 UNIT/ML KWIKPEN]  0     Endocrinology:  Diabetes - Insulins Failed - 07/05/2023  4:19 PM      Failed - HBA1C is between 0 and 7.9 and within 180 days    Hemoglobin A1C  Date Value Ref Range Status  03/02/2023 9.1 (A) 4.0 - 5.6 % Final   Hgb A1c MFr Bld  Date Value Ref Range Status  09/06/2022 12.0 (H) 4.8 - 5.6 % Final    Comment:             Prediabetes: 5.7 - 6.4          Diabetes: >6.4          Glycemic control for adults with diabetes: <7.0          Passed - Valid encounter within last 6 months    Recent Outpatient Visits           2 months ago Primary osteoarthritis of left hip   [place] [facility] at [facility], Ocie Bob, MD   2 months ago Essential (primary) hypertension   [place] [facility]Rob[facility], [place], MD   3 months ago Essential (primary) hypertension   [place] [facility]Rob[facility], [place], MD   4 months ago Lumbosacral spondylosis with radiculopathy    Primary Care & Sports Medicine at [facility], Ocie Bob, MD   4 months ago Type 2 diabetes mellitus with hyperglycemia, with long-term current use of insulin [facility])    [facility] Ronnald Ramp, MD

## 2023-08-03 ENCOUNTER — Other Ambulatory Visit: Payer: Self-pay | Admitting: Family Medicine

## 2023-08-03 DIAGNOSIS — M1612 Unilateral primary osteoarthritis, left hip: Secondary | ICD-10-CM

## 2023-08-03 DIAGNOSIS — M4727 Other spondylosis with radiculopathy, lumbosacral region: Secondary | ICD-10-CM

## 2023-08-07 NOTE — Telephone Encounter (Signed)
Requested medication (s) are due for refill today: see prescriber  Requested medication (s) are on the active medication list: yes   Last refill:  07/04/23 #60 0 refills  Future visit scheduled: na  Notes to clinic:  protocol failed last labs 11/18/21 . PCP Dr. Roxan Hockey. Does Dr. Ashley Royalty want to refill Rx?     Requested Prescriptions  Pending Prescriptions Disp Refills   diclofenac (VOLTAREN) 75 MG EC tablet [Pharmacy Med Name: DICLOFENAC SOD EC 75 MG TAB] 60 tablet 0    Sig: TAKE 1 TABLET BY MOUTH 2 TIMES DAILY AS NEEDED.     Analgesics:  NSAIDS Failed - 08/03/2023  2:35 AM      Failed - Manual Review: Labs are only required if the patient has taken medication for more than 8 weeks.      Failed - HGB in normal range and within 360 days    Hemoglobin  Date Value Ref Range Status  11/18/2021 15.1 13.0 - 17.7 g/dL Final         Failed - PLT in normal range and within 360 days    Platelets  Date Value Ref Range Status  11/18/2021 244 150 - 450 x10E3/uL Final         Failed - HCT in normal range and within 360 days    Hematocrit  Date Value Ref Range Status  11/18/2021 45.9 37.5 - 51.0 % Final         Passed - Cr in normal range and within 360 days    Creatinine  Date Value Ref Range Status  04/25/2014 1.11 0.60 - 1.30 mg/dL Final   Creatinine, Ser  Date Value Ref Range Status  04/24/2023 1.04 0.76 - 1.27 mg/dL Final         Passed - eGFR is 30 or above and within 360 days    EGFR (African American)  Date Value Ref Range Status  04/25/2014 >60  Final   GFR calc Af Amer  Date Value Ref Range Status  03/15/2020 >60 >60 mL/min Final   EGFR (Non-African Amer.)  Date Value Ref Range Status  04/25/2014 >60  Final    Comment:    eGFR values <58mL/min/1.73 m2 may be an indication of chronic kidney disease (CKD). Calculated eGFR is useful in patients with stable renal function. The eGFR calculation will not be reliable in acutely ill patients when serum creatinine  is changing rapidly. It is not useful in  patients on dialysis. The eGFR calculation may not be applicable to patients at the low and high extremes of body sizes, pregnant women, and vegetarians.    GFR calc non Af Amer  Date Value Ref Range Status  03/15/2020 >60 >60 mL/min Final   eGFR  Date Value Ref Range Status  04/24/2023 83 >59 mL/min/1.73 Final         Passed - Patient is not pregnant      Passed - Valid encounter within last 12 months    Recent Outpatient Visits           3 months ago Primary osteoarthritis of left hip   [place] [facility], Ocie Bob, MD   3 months ago Essential (primary) hypertension   [place], [place], MD   4 months ago Essential (primary) hypertension   [place] [facility]-Robinson, [place], MD   5 months ago Lumbosacral spondylosis with radiculopathy   [facility] &  Sports Medicine at [employer] , Ocie Bob, MD   5 months ago Type 2 diabetes mellitus with hyperglycemia, with long-term current use of insulin [facility])   [facility] Ronnald Ramp, MD

## 2023-08-28 ENCOUNTER — Other Ambulatory Visit: Payer: Self-pay | Admitting: Family Medicine

## 2023-09-01 ENCOUNTER — Other Ambulatory Visit: Payer: Self-pay | Admitting: Family Medicine

## 2023-09-01 DIAGNOSIS — E1169 Type 2 diabetes mellitus with other specified complication: Secondary | ICD-10-CM

## 2023-11-07 ENCOUNTER — Other Ambulatory Visit: Payer: Self-pay | Admitting: Family Medicine

## 2023-11-07 ENCOUNTER — Ambulatory Visit (INDEPENDENT_AMBULATORY_CARE_PROVIDER_SITE_OTHER): Payer: 59 | Admitting: Family Medicine

## 2023-11-07 ENCOUNTER — Encounter: Payer: Self-pay | Admitting: Family Medicine

## 2023-11-07 VITALS — BP 140/84 | HR 103 | Ht 76.0 in

## 2023-11-07 DIAGNOSIS — M545 Low back pain, unspecified: Secondary | ICD-10-CM

## 2023-11-07 DIAGNOSIS — N401 Enlarged prostate with lower urinary tract symptoms: Secondary | ICD-10-CM

## 2023-11-07 DIAGNOSIS — E1169 Type 2 diabetes mellitus with other specified complication: Secondary | ICD-10-CM

## 2023-11-07 DIAGNOSIS — E1165 Type 2 diabetes mellitus with hyperglycemia: Secondary | ICD-10-CM

## 2023-11-07 DIAGNOSIS — Z794 Long term (current) use of insulin: Secondary | ICD-10-CM

## 2023-11-07 DIAGNOSIS — E785 Hyperlipidemia, unspecified: Secondary | ICD-10-CM

## 2023-11-07 DIAGNOSIS — I10 Essential (primary) hypertension: Secondary | ICD-10-CM | POA: Diagnosis not present

## 2023-11-07 DIAGNOSIS — M79652 Pain in left thigh: Secondary | ICD-10-CM

## 2023-11-07 DIAGNOSIS — R350 Frequency of micturition: Secondary | ICD-10-CM

## 2023-11-07 MED ORDER — OZEMPIC (0.25 OR 0.5 MG/DOSE) 2 MG/3ML ~~LOC~~ SOPN: PEN_INJECTOR | SUBCUTANEOUS | 6 refills | Status: DC

## 2023-11-07 MED ORDER — BLOOD GLUCOSE METER KIT: PACK | 1 refills | Status: AC

## 2023-11-07 MED ORDER — TIZANIDINE HCL 4 MG PO CAPS: 4.0000 mg | ORAL_CAPSULE | Freq: Three times a day (TID) | ORAL | 1 refills | Status: AC | PRN

## 2023-11-07 MED ORDER — TAMSULOSIN HCL 0.4 MG PO CAPS: 0.4000 mg | ORAL_CAPSULE | Freq: Every day | ORAL | 1 refills | Status: DC

## 2023-11-07 NOTE — Assessment & Plan Note (Signed)
 chronic On tamsulosin 0.4 mg daily to manage urinary symptoms. No new issues reported. - Refill tamsulosin 0.4 mg daily

## 2023-11-07 NOTE — Assessment & Plan Note (Signed)
 Chronic condition associated with diabetes. Blood pressure today was 140/84, slightly elevated. Reports occasional dietary indiscretions contributing to blood pressure variability. Discussed the importance of dietary modifications and consistent medication adherence. - Continue lisinopril 20 mg daily - Continue hydrochlorothiazide 25 mg daily

## 2023-11-07 NOTE — Assessment & Plan Note (Signed)
 Chronic condition associated with diabetes. Last LDL was 73 and triglycerides were 122, indicating good control. Continued management with atorvastatin is appropriate. - Continue atorvastatin 10 mg daily

## 2023-11-07 NOTE — Progress Notes (Signed)
 Established patient visit   Patient: Charles LASSEN [name]   DOB: 1966/08/06   58 y.o. Male  MRN: [id number] Visit Date: 11/07/2023  Today's healthcare provider: Ronnald Ramp, MD   Chief Complaint  Patient presents with   Hypertension    No concerns    Subjective     HPI     Hypertension    Additional comments: No concerns       Last edited by Thedora Hinders, CMA on 11/07/2023  3:49 PM.       Discussed the use of AI scribe software for clinical note transcription with the patient, who gave verbal consent to proceed.  History of Present Illness   [name] is a 58 year old male with type two diabetes, hyperlipidemia, and hypertension who presents for diabetes management.  He has a history of type two diabetes with hyperglycemia, and his diabetes is not at goal, with the last A1c recorded at 9.1 in September 2024. He is currently on Trulicity 0.75 mg weekly, Basaglar 12 units daily, and metformin 1000 mg twice daily for diabetes management. He misses his insulin dose occasionally due to his work schedule and fatigue. He also does not take Trulicity as it did not make him feel right.  He has a history of hypertension and is on lisinopril 20 mg and hydrochlorothiazide 25 mg daily. His blood pressure readings have been generally good, with occasional elevations due to dietary choices.  For hyperlipidemia, his last LDL was 73 and triglycerides were 122. He continues to take atorvastatin 10 mg daily.  He experiences back pain, which he attributes to inflammation and a possible need for hip surgery. He uses Zanaflex for muscle spasms and has previously used diclofenac as needed. Exercises help manage the pain.  He takes tamsulosin 0.4 mg daily for urinary flow issues. He has stopped taking aspirin 325 mg as previously advised.      Past Medical History:  Diagnosis Date   Diabetes mellitus without complication (HCC)    Hypertension      Medications: Outpatient Medications Prior to Visit  Medication Sig   atorvastatin (LIPITOR) 10 MG tablet TAKE 1 TABLET BY MOUTH EVERY DAY   diclofenac (VOLTAREN) 75 MG EC tablet TAKE 1 TABLET BY MOUTH 2 TIMES DAILY AS NEEDED.   fluticasone (FLONASE) 50 MCG/ACT nasal spray SPRAY 2 SPRAYS INTO EACH NOSTRIL EVERY DAY   glucose blood test strip Use as instructed to test blood sugar up to twice daily.   Insulin Glargine (BASAGLAR KWIKPEN) 100 UNIT/ML Inject 12 Units into the skin daily.   Insulin Pen Needle (SURE-FINE PEN NEEDLES) 31G X 8 MM MISC 1 Needle by Does not apply route daily.   lisinopril-hydrochlorothiazide (ZESTORETIC) 20-25 MG tablet Take 1 tablet by mouth daily.   metFORMIN (GLUCOPHAGE) 1000 MG tablet TAKE 1 TABLET (1,000 MG TOTAL) BY MOUTH TWICE A DAY WITH FOOD   [DISCONTINUED] aspirin 325 MG tablet Take 325 mg by mouth daily.   [DISCONTINUED] blood glucose meter kit and supplies Use glucose meter to test blood sugar once in the morning after fasting and 2 hours after largest meal of the day   [DISCONTINUED] chlorhexidine (PERIDEX) 0.12 % solution SMARTSIG:By Mouth   [DISCONTINUED] Dulaglutide (TRULICITY) 0.75 MG/0.5ML SOPN Inject 0.75 mg into the skin once a week.   [DISCONTINUED] meloxicam (MOBIC) 15 MG tablet Take 1 tablet (15 mg total) by mouth daily.   [DISCONTINUED] tamsulosin (FLOMAX) 0.4 MG CAPS capsule Take  1 capsule (0.4 mg total) by mouth daily.   [DISCONTINUED] tiZANidine (ZANAFLEX) 4 MG capsule TAKE 1 CAPSULE BY MOUTH 3 TIMES DAILY. *NONFORMULARY/NOT COVD*   No facility-administered medications prior to visit.    Review of Systems  Last CBC Lab Results  Component Value Date   WBC 5.9 11/18/2021   HGB 15.1 11/18/2021   HCT 45.9 11/18/2021   MCV 81 11/18/2021   MCH 26.7 11/18/2021   RDW 13.6 11/18/2021   PLT 244 11/18/2021   Last metabolic panel Lab Results  Component Value Date   GLUCOSE 290 (H) 04/24/2023   NA 138 04/24/2023   K 4.1 04/24/2023    CL 100 04/24/2023   CO2 22 04/24/2023   BUN 7 04/24/2023   CREATININE 1.04 04/24/2023   EGFR 83 04/24/2023   CALCIUM 9.7 04/24/2023   PROT 6.8 01/02/2023   ALBUMIN 4.1 01/02/2023   LABGLOB 2.7 01/02/2023   AGRATIO 1.5 01/02/2023   BILITOT 0.3 01/02/2023   ALKPHOS 87 01/02/2023   AST 21 01/02/2023   ALT 30 01/02/2023   ANIONGAP 10 03/15/2020   Last lipids Lab Results  Component Value Date   CHOL 134 01/02/2023   HDL 39 (L) 01/02/2023   LDLCALC 73 01/02/2023   TRIG 122 01/02/2023   CHOLHDL 3.4 01/02/2023   Last hemoglobin A1c Lab Results  Component Value Date   HGBA1C 9.1 (A) 03/02/2023   Last thyroid functions Lab Results  Component Value Date   TSH 1.590 07/08/2021        Objective    BP (!) 140/84 (Cuff Size: Large)   Pulse (!) 103   Ht 6\' 4"  (1.93 m)   SpO2 98%   BMI 33.23 kg/m  BP Readings from Last 3 Encounters:  11/07/23 (!) 140/84  05/01/23 138/84  04/24/23 (!) 164/93   Wt Readings from Last 3 Encounters:  05/01/23 273 lb (123.8 kg)  04/24/23 272 lb (123.4 kg)  03/17/23 271 lb (122.9 kg)        Physical Exam Vitals reviewed.  Constitutional:      General: He is not in acute distress.    Appearance: Normal appearance. He is not ill-appearing, toxic-appearing or diaphoretic.  Eyes:     Conjunctiva/sclera: Conjunctivae normal.  Cardiovascular:     Rate and Rhythm: Regular rhythm. Tachycardia present.     Pulses: Normal pulses.          Dorsalis pedis pulses are 2+ on the right side and 2+ on the left side.       Posterior tibial pulses are 2+ on the right side and 2+ on the left side.     Heart sounds: Normal heart sounds. No murmur heard.    No friction rub. No gallop.  Pulmonary:     Effort: Pulmonary effort is normal. No respiratory distress.     Breath sounds: Normal breath sounds. No stridor. No wheezing, rhonchi or rales.  Abdominal:     General: Bowel sounds are normal. There is no distension.     Palpations: Abdomen is soft.      Tenderness: There is no abdominal tenderness.  Musculoskeletal:     Right lower leg: No edema.     Left lower leg: No edema.     Right foot: Normal range of motion. No deformity or bunion.     Left foot: Normal range of motion. No deformity or bunion.  Feet:     Right foot:     Protective Sensation: 6 sites tested.  6 sites sensed.     Skin integrity: Skin integrity normal. No erythema.     Toenail Condition: Right toenails are long.     Left foot:     Protective Sensation: 6 sites tested.  6 sites sensed.     Skin integrity: Skin integrity normal. No erythema.     Toenail Condition: Left toenails are long.  Skin:    Findings: No erythema or rash.  Neurological:     Mental Status: He is alert and oriented to person, place, and time.  Psychiatric:        Mood and Affect: Mood and affect normal.        Speech: Speech normal.        Behavior: Behavior normal. Behavior is cooperative.       No results found for any visits on 11/07/23.  Assessment & Plan     Problem List Items Addressed This Visit       Cardiovascular and Mediastinum   Essential (primary) hypertension - Primary   Chronic condition associated with diabetes. Blood pressure today was 140/84, slightly elevated. Reports occasional dietary indiscretions contributing to blood pressure variability. Discussed the importance of dietary modifications and consistent medication adherence. - Continue lisinopril 20 mg daily - Continue hydrochlorothiazide 25 mg daily      Relevant Orders   CMP14+EGFR     Endocrine   Hyperlipidemia associated with type 2 diabetes mellitus (HCC)   Chronic condition associated with diabetes. Last LDL was 73 and triglycerides were 122, indicating good control. Continued management with atorvastatin is appropriate. - Continue atorvastatin 10 mg daily      Relevant Medications   Semaglutide,0.25 or 0.5MG /DOS, (OZEMPIC, 0.25 OR 0.5 MG/DOSE,) 2 MG/3ML SOPN   Other Relevant Orders   Lipid  panel   Diabetes mellitus (HCC)   Chronic condition with suboptimal control. Last A1c was 9.1 in September. Reports inconsistent medication adherence, particularly with Trulicity, and occasional missed insulin doses. Discussed the importance of consistent medication adherence and the potential benefits of switching to Ozempic. Risks of uncontrolled diabetes include microvascular and macrovascular complications. Benefits of improved glycemic control include reduced risk of complications and potential reduction in medication burden. Prefers Ozempic due to better tolerance and weight management. - Order A1c, CMP, lipid panel, and urine albumin - Perform diabetes foot exam - Switch to Ozempic: 0.25 mg once weekly for 2 weeks, then 0.5 mg once weekly - Continue metformin 1000 mg twice daily - Continue Basaglar 12 units daily - Discontinue Trulicity - Provide new glucose meter      Relevant Medications   Semaglutide,0.25 or 0.5MG /DOS, (OZEMPIC, 0.25 OR 0.5 MG/DOSE,) 2 MG/3ML SOPN   blood glucose meter kit and supplies   Other Relevant Orders   Hemoglobin A1c   Urine microalbumin-creatinine with uACR     Other   Benign prostatic hyperplasia with urinary frequency   chronic On tamsulosin 0.4 mg daily to manage urinary symptoms. No new issues reported. - Refill tamsulosin 0.4 mg daily      Relevant Medications   tamsulosin (FLOMAX) 0.4 MG CAPS capsule   Other Visit Diagnoses       Left lumbar pain       Relevant Medications   tiZANidine (ZANAFLEX) 4 MG capsule     Thigh pain, musculoskeletal, left       Relevant Medications   tiZANidine (ZANAFLEX) 4 MG capsule        Chronic Back Pain Reports chronic back pain exacerbated by hip issues and inflammation.  Previous use of diclofenac and muscle relaxants provided relief. Prefers to avoid hip surgery at this time. Discussed non-surgical management options including physical therapy and medication. - Refill Zanaflex 4 mg three times  daily as needed for muscle spasms    General Health Maintenance Routine health maintenance discussed. Eye exam recently completed with no diabetic retinopathy noted. - Ensure regular follow-up with eye doctor      Return in about 3 months (around 02/07/2024) for HTN, DM.         Ronnald Ramp, MD  [facility] [phone] (phone) [phone] (fax)  [facility]

## 2023-11-07 NOTE — Assessment & Plan Note (Signed)
 Chronic condition with suboptimal control. Last A1c was 9.1 in September. Reports inconsistent medication adherence, particularly with Trulicity, and occasional missed insulin doses. Discussed the importance of consistent medication adherence and the potential benefits of switching to Ozempic. Risks of uncontrolled diabetes include microvascular and macrovascular complications. Benefits of improved glycemic control include reduced risk of complications and potential reduction in medication burden. Prefers Ozempic due to better tolerance and weight management. - Order A1c, CMP, lipid panel, and urine albumin - Perform diabetes foot exam - Switch to Ozempic: 0.25 mg once weekly for 2 weeks, then 0.5 mg once weekly - Continue metformin 1000 mg twice daily - Continue Basaglar 12 units daily - Discontinue Trulicity - Provide new glucose meter

## 2023-11-08 ENCOUNTER — Encounter: Payer: Self-pay | Admitting: Family Medicine

## 2023-11-08 ENCOUNTER — Other Ambulatory Visit: Payer: Self-pay | Admitting: Family Medicine

## 2023-11-08 DIAGNOSIS — E1165 Type 2 diabetes mellitus with hyperglycemia: Secondary | ICD-10-CM

## 2023-11-08 LAB — CMP14+EGFR
ALT: 37 IU/L (ref 0–44)
AST: 33 IU/L (ref 0–40)
Albumin: 4 g/dL (ref 3.8–4.9)
Alkaline Phosphatase: 93 IU/L (ref 44–121)
BUN/Creatinine Ratio: 11 (ref 9–20)
BUN: 11 mg/dL (ref 6–24)
Bilirubin Total: 0.3 mg/dL (ref 0.0–1.2)
CO2: 22 mmol/L (ref 20–29)
Calcium: 9.6 mg/dL (ref 8.7–10.2)
Chloride: 103 mmol/L (ref 96–106)
Creatinine, Ser: 1 mg/dL (ref 0.76–1.27)
Globulin, Total: 3 g/dL (ref 1.5–4.5)
Glucose: 177 mg/dL — ABNORMAL HIGH (ref 70–99)
Potassium: 4.2 mmol/L (ref 3.5–5.2)
Sodium: 139 mmol/L (ref 134–144)
Total Protein: 7 g/dL (ref 6.0–8.5)
eGFR: 87 mL/min/{1.73_m2} (ref >59)

## 2023-11-08 LAB — LIPID PANEL
Chol/HDL Ratio: 3.4 ratio (ref 0.0–5.0)
Cholesterol, Total: 126 mg/dL (ref 100–199)
HDL: 37 mg/dL — ABNORMAL LOW (ref >39)
LDL Chol Calc (NIH): 69 mg/dL (ref 0–99)
Triglycerides: 106 mg/dL (ref 0–149)
VLDL Cholesterol Cal: 20 mg/dL (ref 5–40)

## 2023-11-08 LAB — MICROALBUMIN / CREATININE URINE RATIO
Creatinine, Urine: 320.9 mg/dL
Microalb/Creat Ratio: 16 mg/g{creat} (ref 0–29)
Microalbumin, Urine: 50.6 ug/mL

## 2023-11-08 LAB — HEMOGLOBIN A1C
Est. average glucose Bld gHb Est-mCnc: 263 mg/dL
Hgb A1c MFr Bld: 10.8 % — ABNORMAL HIGH (ref 4.8–5.6)

## 2023-11-08 MED ORDER — BASAGLAR KWIKPEN 100 UNIT/ML ~~LOC~~ SOPN: 16.0000 [IU] | PEN_INJECTOR | Freq: Every day | SUBCUTANEOUS | Status: DC

## 2023-11-20 ENCOUNTER — Other Ambulatory Visit (HOSPITAL_COMMUNITY): Payer: Self-pay

## 2023-11-20 ENCOUNTER — Telehealth: Payer: Self-pay | Admitting: Pharmacy Technician

## 2023-11-20 NOTE — Telephone Encounter (Signed)
 Pharmacy Patient Advocate Encounter   Received notification from RX Request Messages that prior authorization for LIRAGLUTIDE 18MG /3ML PEN  is required/requested.   Insurance verification completed.   The patient is insured through [facility] .   Per test claim: PA required; PA submitted to above mentioned insurance via CoverMyMeds Key/confirmation #/EOC [id number] Status is pending

## 2023-11-21 ENCOUNTER — Telehealth: Payer: Self-pay | Admitting: Pharmacy Technician

## 2023-11-21 ENCOUNTER — Other Ambulatory Visit (HOSPITAL_COMMUNITY): Payer: Self-pay

## 2023-11-21 NOTE — Telephone Encounter (Signed)
 Pharmacy Patient Advocate Encounter  Received notification from [facility] that Prior Authorization for LIRAGLUTIDE 18MG /3ML PEN-INJECTORS has been APPROVED from 11/20/2023 to 11/19/2024. Ran test claim, Copay is $10.00. This test claim was processed through [facility]- copay amounts may vary at other pharmacies due to pharmacy/plan contracts, or as the patient moves through the different stages of their insurance plan.   PA #/Case ID/Reference #: [id number]

## 2023-11-21 NOTE — Telephone Encounter (Signed)
 Pharmacy Patient Advocate Encounter   Received notification from RX Request Messages that prior authorization for [facility] 0.25MG  OR 0.5MG /DOSE is required/requested.   Insurance verification completed.   The patient is insured through [facility] .   Per test claim: PA required; PA submitted to above mentioned insurance via CoverMyMeds Key/confirmation #/EOC [id number] Status is pending

## 2023-11-21 NOTE — Telephone Encounter (Signed)
 Pharmacy Patient Advocate Encounter   Received notification from RX Request Messages that prior authorization for OZEMPIC 0.25MG  OR 0.5MG  DOSE is required/requested.   Insurance verification completed.   The patient is insured through [facility] .   Per test claim: PA required; PA started via CoverMyMeds. KEY [id number] . Waiting for clinical questions to populate.

## 2023-11-21 NOTE — Telephone Encounter (Signed)
 Called and spoke to the pt wife to inform her per the insurance company he has been approved for Liraglutide Pen Injector. She verbally stated she understood and would let her husband no.

## 2023-11-22 ENCOUNTER — Other Ambulatory Visit (HOSPITAL_COMMUNITY): Payer: Self-pay

## 2023-11-22 NOTE — Telephone Encounter (Signed)
 Pharmacy Patient Advocate Encounter  Received notification from [facility] that Prior Authorization for [facility] 0.25MG  OR 0.5MG /DOSE has been APPROVED from 11/22/2023 to 11/21/2024. Ran test claim, Copay is $45.00. This test claim was processed through [facility]- copay amounts may vary at other pharmacies due to pharmacy/plan contracts, or as the patient moves through the different stages of their insurance plan.   PA #/Case ID/Reference #: Key: [id number]

## 2023-11-23 NOTE — Telephone Encounter (Signed)
 PA request has been Approved. New Encounter has been or will be created for follow up. For additional info see Pharmacy Prior Auth telephone encounter from 11/21/2023.

## 2023-11-29 ENCOUNTER — Other Ambulatory Visit: Payer: Self-pay | Admitting: Family Medicine

## 2023-11-29 DIAGNOSIS — M79652 Pain in left thigh: Secondary | ICD-10-CM

## 2023-11-29 DIAGNOSIS — M545 Low back pain, unspecified: Secondary | ICD-10-CM

## 2023-12-01 NOTE — Telephone Encounter (Signed)
 Requested medications are due for refill today.  no  Requested medications are on the active medications list.  yes  Last refill. 11/07/2023 #90 1 rf  Future visit scheduled.   yes  Notes to clinic.  Refill/refusal not delegated.    Requested Prescriptions  Pending Prescriptions Disp Refills   tiZANidine (ZANAFLEX) 4 MG capsule [Pharmacy Med Name: TIZANIDINE HCL 4 MG CAPSULE] 270 capsule 1    Sig: TAKE 1 CAPSULE BY MOUTH 3 TIMES DAILY AS NEEDED FOR MUSCLE SPASMS.     Not Delegated - Cardiovascular:  Alpha-2 Agonists - tizanidine Failed - 12/01/2023  8:07 AM      Failed - This refill cannot be delegated      Passed - Valid encounter within last 6 months    Recent Outpatient Visits           3 weeks ago Essential (primary) hypertension   [facility] Simmons-Robinson, Tawanna Cooler, MD       Future Appointments             In 2 months Simmons-Robinson, Tawanna Cooler, MD [facility]

## 2023-12-04 ENCOUNTER — Other Ambulatory Visit: Payer: Self-pay | Admitting: Family Medicine

## 2023-12-04 DIAGNOSIS — M4727 Other spondylosis with radiculopathy, lumbosacral region: Secondary | ICD-10-CM

## 2023-12-04 DIAGNOSIS — M1612 Unilateral primary osteoarthritis, left hip: Secondary | ICD-10-CM

## 2023-12-06 NOTE — Telephone Encounter (Signed)
 Requested medication (s) are due for refill today: Yes  Requested medication (s) are on the active medication list: Yes  Last refill:  07/04/23  Future visit scheduled:   Notes to clinic:  Manual review.    Requested Prescriptions  Pending Prescriptions Disp Refills   diclofenac (VOLTAREN) 75 MG EC tablet [Pharmacy Med Name: DICLOFENAC SOD EC 75 MG TAB] 60 tablet 0    Sig: TAKE 1 TABLET BY MOUTH 2 TIMES DAILY AS NEEDED.     Analgesics:  NSAIDS Failed - 12/06/2023  3:48 PM      Failed - Manual Review: Labs are only required if the patient has taken medication for more than 8 weeks.      Failed - HGB in normal range and within 360 days    Hemoglobin  Date Value Ref Range Status  11/18/2021 15.1 13.0 - 17.7 g/dL Final         Failed - PLT in normal range and within 360 days    Platelets  Date Value Ref Range Status  11/18/2021 244 150 - 450 x10E3/uL Final         Failed - HCT in normal range and within 360 days    Hematocrit  Date Value Ref Range Status  11/18/2021 45.9 37.5 - 51.0 % Final         Passed - Cr in normal range and within 360 days    Creatinine  Date Value Ref Range Status  04/25/2014 1.11 0.60 - 1.30 mg/dL Final   Creatinine, Ser  Date Value Ref Range Status  11/07/2023 1.00 0.76 - 1.27 mg/dL Final         Passed - eGFR is 30 or above and within 360 days    EGFR (African American)  Date Value Ref Range Status  04/25/2014 >60  Final   GFR calc Af Amer  Date Value Ref Range Status  03/15/2020 >60 >60 mL/min Final   EGFR (Non-African Amer.)  Date Value Ref Range Status  04/25/2014 >60  Final    Comment:    eGFR values <26mL/min/1.73 m2 may be an indication of chronic kidney disease (CKD). Calculated eGFR is useful in patients with stable renal function. The eGFR calculation will not be reliable in acutely ill patients when serum creatinine is changing rapidly. It is not useful in  patients on dialysis. The eGFR calculation may not be  applicable to patients at the low and high extremes of body sizes, pregnant women, and vegetarians.    GFR calc non Af Amer  Date Value Ref Range Status  03/15/2020 >60 >60 mL/min Final   eGFR  Date Value Ref Range Status  11/07/2023 87 >59 mL/min/1.73 Final         Passed - Patient is not pregnant      Passed - Valid encounter within last 12 months    Recent Outpatient Visits           4 weeks ago Essential (primary) hypertension   [name], Tawanna Cooler, MD       Future Appointments             In 2 months [name], Tawanna Cooler, MD [facility]

## 2023-12-07 NOTE — Telephone Encounter (Signed)
 Please review.  KP

## 2023-12-08 ENCOUNTER — Other Ambulatory Visit: Payer: Self-pay | Admitting: Family Medicine

## 2023-12-08 DIAGNOSIS — E1169 Type 2 diabetes mellitus with other specified complication: Secondary | ICD-10-CM

## 2023-12-08 NOTE — Telephone Encounter (Signed)
 Requested medication (s) are due for refill today: Yes  Requested medication (s) are on the active medication list: Yes  Last refill:  05/22/23  Future visit scheduled: Yes  Notes to clinic:  Unable to refill per protocol due to failed labs, no updated results. CBC needed     Requested Prescriptions  Pending Prescriptions Disp Refills   metFORMIN (GLUCOPHAGE) 1000 MG tablet [Pharmacy Med Name: METFORMIN HCL 1,000 MG TABLET] 60 tablet 5    Sig: TAKE 1 TABLET (1,000 MG TOTAL) BY MOUTH TWICE A DAY WITH FOOD     Endocrinology:  Diabetes - Biguanides Failed - 12/08/2023  3:23 PM      Failed - HBA1C is between 0 and 7.9 and within 180 days    Hgb A1c MFr Bld  Date Value Ref Range Status  11/07/2023 10.8 (H) 4.8 - 5.6 % Final    Comment:             Prediabetes: 5.7 - 6.4          Diabetes: >6.4          Glycemic control for adults with diabetes: <7.0          Failed - B12 Level in normal range and within 720 days    No results found for: "VITAMINB12"       Failed - CBC within normal limits and completed in the last 12 months    WBC  Date Value Ref Range Status  11/18/2021 5.9 3.4 - 10.8 x10E3/uL Final  03/15/2020 10.2 4.0 - 10.5 K/uL Final   RBC  Date Value Ref Range Status  11/18/2021 5.65 4.14 - 5.80 x10E6/uL Final  03/15/2020 5.86 (H) 4.22 - 5.81 MIL/uL Final   Hemoglobin  Date Value Ref Range Status  11/18/2021 15.1 13.0 - 17.7 g/dL Final   Hematocrit  Date Value Ref Range Status  11/18/2021 45.9 37.5 - 51.0 % Final   MCHC  Date Value Ref Range Status  11/18/2021 32.9 31.5 - 35.7 g/dL Final  84/69/6295 28.4 30.0 - 36.0 g/dL Final   [facility]  Date Value Ref Range Status  11/18/2021 26.7 26.6 - 33.0 pg Final  03/15/2020 27.0 26.0 - 34.0 pg Final   MCV  Date Value Ref Range Status  11/18/2021 81 79 - 97 fL Final  04/25/2014 84 80 - 100 fL Final   No results found for: "PLTCOUNTKUC", "LABPLAT", "POCPLA" RDW  Date Value Ref Range Status  11/18/2021 13.6 11.6 -  15.4 % Final  04/25/2014 14.2 11.5 - 14.5 % Final         Passed - Cr in normal range and within 360 days    Creatinine  Date Value Ref Range Status  04/25/2014 1.11 0.60 - 1.30 mg/dL Final   Creatinine, Ser  Date Value Ref Range Status  11/07/2023 1.00 0.76 - 1.27 mg/dL Final         Passed - eGFR in normal range and within 360 days    EGFR (African American)  Date Value Ref Range Status  04/25/2014 >60  Final   GFR calc Af Amer  Date Value Ref Range Status  03/15/2020 >60 >60 mL/min Final   EGFR (Non-African Amer.)  Date Value Ref Range Status  04/25/2014 >60  Final    Comment:    eGFR values <70mL/min/1.73 m2 may be an indication of chronic kidney disease (CKD). Calculated eGFR is useful in patients with stable renal function. The eGFR calculation will not be reliable in acutely ill  patients when serum creatinine is changing rapidly. It is not useful in  patients on dialysis. The eGFR calculation may not be applicable to patients at the low and high extremes of body sizes, pregnant women, and vegetarians.    GFR calc non Af Amer  Date Value Ref Range Status  03/15/2020 >60 >60 mL/min Final   eGFR  Date Value Ref Range Status  11/07/2023 87 >59 mL/min/1.73 Final         Passed - Valid encounter within last 6 months    Recent Outpatient Visits           1 month ago Essential (primary) hypertension   [facility] Simmons-Robinson, Tawanna Cooler, MD       Future Appointments             In 2 months Simmons-Robinson, Tawanna Cooler, MD [facility], [facility]

## 2024-02-07 ENCOUNTER — Ambulatory Visit: Admitting: Family Medicine

## 2024-02-22 ENCOUNTER — Other Ambulatory Visit: Payer: Self-pay | Admitting: Family Medicine

## 2024-02-22 DIAGNOSIS — E1165 Type 2 diabetes mellitus with hyperglycemia: Secondary | ICD-10-CM

## 2024-02-22 NOTE — Telephone Encounter (Unsigned)
 Copied from CRM [id number]. Topic: Clinical - Medication Refill >> Feb 22, 2024  5:39 PM Felizardo Hotter wrote: Medication: Insulin  Glargine (BASAGLAR  KWIKPEN) 100 UNIT/ML  Has the patient contacted their pharmacy? Yes (Agent: If no, request that the patient contact the pharmacy for the refill. If patient does not wish to contact the pharmacy document the reason why and proceed with request.) (Agent: If yes, when and what did the pharmacy advise?)  This is the patient's preferred pharmacy:  [facility] [address] [place] - [address] [place] [address] Phone: [phone] Fax: [phone]  Is this the correct pharmacy for this prescription? Yes If no, delete pharmacy and type the correct one.   Has the prescription been filled recently? Yes  Is the patient out of the medication? Yes  Has the patient been seen for an appointment in the last year OR does the patient have an upcoming appointment? Yes  Can we respond through MyChart? Yes  Agent: Please be advised that Rx refills may take up to 3 business days. We ask that you follow-up with your pharmacy.

## 2024-02-23 ENCOUNTER — Other Ambulatory Visit: Payer: Self-pay

## 2024-02-23 ENCOUNTER — Telehealth: Payer: Self-pay

## 2024-02-23 ENCOUNTER — Other Ambulatory Visit: Payer: Self-pay | Admitting: Family Medicine

## 2024-02-23 DIAGNOSIS — E1169 Type 2 diabetes mellitus with other specified complication: Secondary | ICD-10-CM

## 2024-02-23 DIAGNOSIS — E1165 Type 2 diabetes mellitus with hyperglycemia: Secondary | ICD-10-CM

## 2024-02-23 MED ORDER — BASAGLAR KWIKPEN 100 UNIT/ML ~~LOC~~ SOPN: 16.0000 [IU] | PEN_INJECTOR | Freq: Every day | SUBCUTANEOUS | 3 refills | Status: DC

## 2024-02-23 MED ORDER — ATORVASTATIN CALCIUM 10 MG PO TABS: 10.0000 mg | ORAL_TABLET | Freq: Every day | ORAL | 1 refills | Status: DC

## 2024-02-23 NOTE — Telephone Encounter (Signed)
 Copied from CRM [id number]. Topic: General - Other >> Feb 23, 2024 10:20 AM Turkey B wrote: Reason for CRM: Pt's wife called back in about med , refill, insulin  garglin. I let her know of 24-48 hr turn around. She states pt has been without insulin  for a month. She wants Dr Saralee Cummins to cb to speak to her about this

## 2024-02-23 NOTE — Telephone Encounter (Signed)
 Pt wife stated pt has been out of his insulin  for 2 weeks, states [facility] have [facility] request I advised pt I do not see any request from pharmacy. I refilled atorvastatin  as pt he was also out. Sending protocol for insulin  basaglar . LOV 11/07/23 NOV 03/25/24 LRF 11/08/23 No Q: or R: in information box LABS 11/07/23 A1c 10.8

## 2024-02-23 NOTE — Telephone Encounter (Unsigned)
 Copied from CRM [id number]. Topic: General - Other >> Feb 22, 2024  5:41 PM Felizardo Hotter wrote: Reason for CRM: Pt's wife Mrs. Umholtz ph: [phone] would like a call back regarding pt's  Insulin  Glargine (BASAGLAR  KWIKPEN) 100 UNIT/ML. >> Feb 23, 2024 10:18 AM Turkey B wrote: Pt's wife called back in about med , refill, insulin  garglin. I let her know of 24-48 hr turn around. She states pt has been without insulin  for a month. She wants Dr Saralee Cummins to cb to speak to her about this

## 2024-02-26 NOTE — Telephone Encounter (Signed)
 Was just sent on 6/20 by Dr R.

## 2024-02-26 NOTE — Telephone Encounter (Signed)
 Insulin  Glargine (BASAGLAR  KWIKPEN) 100 UNIT/ML 15 mL 3 02/23/2024 --   Sig - Route: Inject 16 Units into the skin daily. - Subcutaneous   Sent to pharmacy as: Insulin  Glargine (BASAGLAR  KWIKPEN) 100 UNIT/ML   E-Prescribing Status: Receipt confirmed by pharmacy (02/23/2024  5:29 PM EDT)     Requested Prescriptions  Pending Prescriptions Disp Refills   Insulin  Glargine (BASAGLAR  KWIKPEN) 100 UNIT/ML      Sig: Inject 16 Units into the skin daily.     Endocrinology:  Diabetes - Insulins Failed - 02/26/2024  3:15 PM      Failed - HBA1C is between 0 and 7.9 and within 180 days    Hgb A1c MFr Bld  Date Value Ref Range Status  11/07/2023 10.8 (H) 4.8 - 5.6 % Final    Comment:             Prediabetes: 5.7 - 6.4          Diabetes: >6.4          Glycemic control for adults with diabetes: <7.0          Passed - Valid encounter within last 6 months    Recent Outpatient Visits           3 months ago Essential (primary) hypertension   [facility] [place], [place], MD

## 2024-03-25 ENCOUNTER — Ambulatory Visit: Admitting: Family Medicine

## 2024-03-28 ENCOUNTER — Encounter: Payer: Self-pay | Admitting: Family Medicine

## 2024-03-28 ENCOUNTER — Ambulatory Visit (INDEPENDENT_AMBULATORY_CARE_PROVIDER_SITE_OTHER): Admitting: Family Medicine

## 2024-03-28 VITALS — BP 131/86 | HR 92 | Ht 76.0 in | Wt 266.0 lb

## 2024-03-28 DIAGNOSIS — E1165 Type 2 diabetes mellitus with hyperglycemia: Secondary | ICD-10-CM

## 2024-03-28 DIAGNOSIS — E1159 Type 2 diabetes mellitus with other circulatory complications: Secondary | ICD-10-CM

## 2024-03-28 DIAGNOSIS — I152 Hypertension secondary to endocrine disorders: Secondary | ICD-10-CM

## 2024-03-28 DIAGNOSIS — E1169 Type 2 diabetes mellitus with other specified complication: Secondary | ICD-10-CM

## 2024-03-28 DIAGNOSIS — Z794 Long term (current) use of insulin: Secondary | ICD-10-CM

## 2024-03-28 DIAGNOSIS — E785 Hyperlipidemia, unspecified: Secondary | ICD-10-CM

## 2024-03-28 DIAGNOSIS — I10 Essential (primary) hypertension: Secondary | ICD-10-CM | POA: Diagnosis not present

## 2024-03-28 LAB — POCT GLYCOSYLATED HEMOGLOBIN (HGB A1C): Hemoglobin A1C: 11.3 % — AB (ref 4.0–5.6)

## 2024-03-28 MED ORDER — LIRAGLUTIDE 18 MG/3ML ~~LOC~~ SOPN: 1.2000 mg | PEN_INJECTOR | Freq: Every day | SUBCUTANEOUS | 6 refills | Status: DC

## 2024-03-28 MED ORDER — LISINOPRIL-HYDROCHLOROTHIAZIDE 20-25 MG PO TABS
1.0000 | ORAL_TABLET | Freq: Every day | ORAL | 3 refills | Status: AC
Start: 2024-03-28 — End: ?

## 2024-03-28 NOTE — Patient Instructions (Addendum)
  VISIT SUMMARY: Today, we reviewed the management of your hypertension, diabetes, and cholesterol. Your blood pressure is stable, but your diabetes management needs improvement. We discussed your current medications, dietary habits, and interest in alternative treatments.  YOUR PLAN: -TYPE 2 DIABETES MELLITUS: Type 2 diabetes is a condition where your body does not use insulin  properly, leading to high blood sugar levels. Your HbA1c has increased to 11.3%, indicating poor control. We need to correct the dosing of Victoza  to 1.2 mg daily. Please follow the correct dosing instructions and continue with your current insulin  regimen. We will reassess your diabetes management in two months.  -HYPERTENSION: Hypertension, or high blood pressure, is well-controlled with your current medications. Please continue taking lisinopril  20 mg and hydrochlorothiazide  25 mg daily.  -HYPERLIPIDEMIA: Hyperlipidemia is a condition with high levels of fats in your blood. Continue taking atorvastatin  10 mg daily to manage your cholesterol levels.  -GENERAL HEALTH MAINTENANCE: You are making good dietary adjustments to manage your diabetes and weight, including drinking more water, eating salads, and choosing low-sugar beverages. Keep up with these healthy habits.  INSTRUCTIONS: Please follow up in two months to reassess your diabetes management. Continue monitoring your blood glucose levels regularly and adhere to the correct dosing of your medications.                      Contains text generated by [employer]bridge.                                 Contains text generated by Abridge.

## 2024-03-28 NOTE — Assessment & Plan Note (Signed)
 Chronic condition with suboptimal control. Last A1c was elevated at 10.8, will update A1c today  Poorly controlled type 2 diabetes mellitus with HbA1c of 11.3%, increased from 10.8%. Reports fluctuating blood glucose levels and has been off insulin  for two weeks due to prescription refill issues. Currently on Basaglar  insulin  16 units daily and Victoza , mistakenly taken weekly instead of daily. Interested in non-traditional therapies, specifically mentioning a famous doctor named Doctor Saves who claims that diabetes is caused by parasites in the pancreas, not sweets and bread as previously thought. The patient is considering this alternative perspective and asks his doctor to look into it further. The patient expresses frustration with sticking to his medication regimen and appears to be seeking alternative solutions. - Order A1c - Continue metformin  1000 mg twice daily - Continue Basaglar  16 units daily - Increase Victoza  to 1.2 mg daily with correct dosing instructions. - Follow up in two months to reassess diabetes management.

## 2024-03-28 NOTE — Progress Notes (Signed)
 Established patient visit   Patient: Charles APODACA [name]   DOB: 1966-06-04   58 y.o. Male  MRN: [id number] Visit Date: 03/28/2024  Today's healthcare provider: Rockie Agent, MD   Chief Complaint  Patient presents with   Hypertension   Subjective       Discussed the use of AI scribe software for clinical note transcription with the patient, who gave verbal consent to proceed.  History of Present Illness [name] Charles [name]. is a 58 year old male with hypertension and diabetes who presents for management of these conditions.  He has stable blood pressure readings, currently at 131/86 mmHg, managed with lisinopril  20 mg and hydrochlorothiazide  25 mg. His medications are automatically refilled, and he receives notifications when refills are due.  His diabetes management is challenging, with fluctuating blood sugar levels. Morning blood sugar levels are typically between 100-105 mg/dL, occasionally dropping to 99 mg/dL. Consumption of watermelon can increase his morning blood sugar to 108-110 mg/dL. He is on insulin  (Basaglar ) at 16 units daily and Victoza , which he mistakenly thought was once a week but is actually daily. He also takes metformin  and has a large supply at home.  He mentions a recent two-week period without insulin  due to a refill issue, during which he managed his blood sugar through diet. His weight is decreasing, currently at 266 pounds, and he is following a diet with reduced sugar intake, including eating salads with tuna and drinking water and Gatorade Zero.  He is also on atorvastatin  10 mg for cholesterol management, which he resumed recently after a brief interruption. His cholesterol medication was previously steady and automatically refilled.  He shares a story about a friend who attended a seminar by a doctor discussing alternative diabetes treatments involving parasites in the pancreas. He is interested in learning more about this but has  not pursued it further.     Past Medical History:  Diagnosis Date   Diabetes mellitus without complication (HCC)    Hypertension     Medications: Outpatient Medications Prior to Visit  Medication Sig   Accu-Chek Softclix Lancets lancets SMARTSIG:Topical   atorvastatin  (LIPITOR) 10 MG tablet Take 1 tablet (10 mg total) by mouth daily.   blood glucose meter kit and supplies Use glucose meter to test blood sugar once in the morning after fasting and 2 hours after largest meal of the day   fluticasone  (FLONASE ) 50 MCG/ACT nasal spray SPRAY 2 SPRAYS INTO EACH NOSTRIL EVERY DAY   glucose blood test strip Use as instructed to test blood sugar up to twice daily.   Insulin  Glargine (BASAGLAR  KWIKPEN) 100 UNIT/ML Inject 16 Units into the skin daily.   Insulin  Pen Needle (SURE-FINE PEN NEEDLES) 31G X 8 MM MISC 1 Needle by Does not apply route daily.   metFORMIN  (GLUCOPHAGE ) 1000 MG tablet TAKE 1 TABLET (1,000 MG TOTAL) BY MOUTH TWICE A DAY WITH FOOD   tamsulosin  (FLOMAX ) 0.4 MG CAPS capsule Take 1 capsule (0.4 mg total) by mouth daily.   tiZANidine  (ZANAFLEX ) 4 MG capsule Take 1 capsule (4 mg total) by mouth 3 (three) times daily as needed for muscle spasms.   [DISCONTINUED] diclofenac  (VOLTAREN ) 75 MG EC tablet TAKE 1 TABLET BY MOUTH 2 TIMES DAILY AS NEEDED.   [DISCONTINUED] liraglutide  (VICTOZA ) 18 MG/3ML SOPN Inject 0.6 mg into the skin once a week.   [DISCONTINUED] lisinopril -hydrochlorothiazide  (ZESTORETIC ) 20-25 MG tablet Take 1 tablet by mouth daily.   No facility-administered medications prior to  visit.    Review of Systems  Last metabolic panel Lab Results  Component Value Date   GLUCOSE 177 (H) 11/07/2023   NA 139 11/07/2023   K 4.2 11/07/2023   CL 103 11/07/2023   CO2 22 11/07/2023   BUN 11 11/07/2023   CREATININE 1.00 11/07/2023   EGFR 87 11/07/2023   CALCIUM  9.6 11/07/2023   PROT 7.0 11/07/2023   ALBUMIN 4.0 11/07/2023   LABGLOB 3.0 11/07/2023   AGRATIO 1.5  01/02/2023   BILITOT 0.3 11/07/2023   ALKPHOS 93 11/07/2023   AST 33 11/07/2023   ALT 37 11/07/2023   ANIONGAP 10 03/15/2020   Last lipids Lab Results  Component Value Date   CHOL 126 11/07/2023   HDL 37 (L) 11/07/2023   LDLCALC 69 11/07/2023   TRIG 106 11/07/2023   CHOLHDL 3.4 11/07/2023   Last hemoglobin A1c Lab Results  Component Value Date   HGBA1C 11.3 (A) 03/28/2024        Objective    BP 131/86   Pulse 92   Ht 6' 4 (1.93 m)   Wt 266 lb (120.7 kg)   SpO2 97%   BMI 32.38 kg/m  BP Readings from Last 3 Encounters:  03/28/24 131/86  11/07/23 (!) 140/84  05/01/23 138/84   Wt Readings from Last 3 Encounters:  03/28/24 266 lb (120.7 kg)  05/01/23 273 lb (123.8 kg)  04/24/23 272 lb (123.4 kg)        Physical Exam  General: Alert, no acute distress Cardio: RRR, no r/m/g Pulm: CTAB, normal work of breathing    Results for orders placed or performed in visit on 03/28/24  POCT HgB A1C  Result Value Ref Range   Hemoglobin A1C 11.3 (A) 4.0 - 5.6 %   HbA1c POC (<> result, manual entry)     HbA1c, POC (prediabetic range)     HbA1c, POC (controlled diabetic range)      Assessment & Plan     Problem List Items Addressed This Visit       Cardiovascular and Mediastinum   Hypertension associated with diabetes (HCC)   Chronic  Well-controlled hypertension with current blood pressure of 131/86 mmHg on current medication regimen. - Continue lisinopril  20 mg and hydrochlorothiazide  25 mg daily.      Relevant Medications   liraglutide  (VICTOZA ) 18 MG/3ML SOPN   lisinopril -hydrochlorothiazide  (ZESTORETIC ) 20-25 MG tablet     Endocrine   Hyperlipidemia associated with type 2 diabetes mellitus (HCC)   Hyperlipidemia, Chronic  On atorvastatin  10 mg for cholesterol management. - Continue atorvastatin  10 mg daily.      Relevant Medications   liraglutide  (VICTOZA ) 18 MG/3ML SOPN   lisinopril -hydrochlorothiazide  (ZESTORETIC ) 20-25 MG tablet   Diabetes  mellitus (HCC) - Primary   Chronic condition with suboptimal control. Last A1c was elevated at 10.8, will update A1c today  Poorly controlled type 2 diabetes mellitus with HbA1c of 11.3%, increased from 10.8%. Reports fluctuating blood glucose levels and has been off insulin  for two weeks due to prescription refill issues. Currently on Basaglar  insulin  16 units daily and Victoza , mistakenly taken weekly instead of daily. Interested in non-traditional therapies, specifically mentioning a famous doctor named Doctor Saves who claims that diabetes is caused by parasites in the pancreas, not sweets and bread as previously thought. The patient is considering this alternative perspective and asks his doctor to look into it further. The patient expresses frustration with sticking to his medication regimen and appears to be seeking alternative solutions. - Order  A1c - Continue metformin  1000 mg twice daily - Continue Basaglar  16 units daily - Increase Victoza  to 1.2 mg daily with correct dosing instructions. - Follow up in two months to reassess diabetes management.      Relevant Medications   liraglutide  (VICTOZA ) 18 MG/3ML SOPN   lisinopril -hydrochlorothiazide  (ZESTORETIC ) 20-25 MG tablet   Other Relevant Orders   POCT HgB A1C (Completed)     Assessment & Plan      General Health Maintenance Making dietary adjustments to manage diabetes and weight, including increased water, salads, and low-sugar beverages. Has lost weight and is monitoring blood glucose levels regularly. - Encourage continued dietary modifications and weight management strategies.     Return in about 3 months (around 06/28/2024) for DM.         Rockie Agent, MD  [facility] [phone] (phone) [phone] (fax)  [facility]

## 2024-03-29 ENCOUNTER — Ambulatory Visit: Payer: Self-pay | Admitting: Family Medicine

## 2024-03-29 NOTE — Assessment & Plan Note (Signed)
 Chronic  Well-controlled hypertension with current blood pressure of 131/86 mmHg on current medication regimen. - Continue lisinopril  20 mg and hydrochlorothiazide  25 mg daily.

## 2024-03-29 NOTE — Assessment & Plan Note (Signed)
 Hyperlipidemia, Chronic  On atorvastatin  10 mg for cholesterol management. - Continue atorvastatin  10 mg daily.

## 2024-06-04 ENCOUNTER — Encounter: Payer: Self-pay | Admitting: Family Medicine

## 2024-06-04 ENCOUNTER — Ambulatory Visit (INDEPENDENT_AMBULATORY_CARE_PROVIDER_SITE_OTHER): Admitting: Family Medicine

## 2024-06-04 VITALS — BP 128/85 | HR 93 | Temp 97.7°F | Ht 76.0 in | Wt 256.1 lb

## 2024-06-04 DIAGNOSIS — E1169 Type 2 diabetes mellitus with other specified complication: Secondary | ICD-10-CM | POA: Diagnosis not present

## 2024-06-04 DIAGNOSIS — R350 Frequency of micturition: Secondary | ICD-10-CM

## 2024-06-04 DIAGNOSIS — E785 Hyperlipidemia, unspecified: Secondary | ICD-10-CM

## 2024-06-04 DIAGNOSIS — H04123 Dry eye syndrome of bilateral lacrimal glands: Secondary | ICD-10-CM | POA: Diagnosis not present

## 2024-06-04 DIAGNOSIS — Z794 Long term (current) use of insulin: Secondary | ICD-10-CM

## 2024-06-04 DIAGNOSIS — R310 Gross hematuria: Secondary | ICD-10-CM

## 2024-06-04 DIAGNOSIS — E1165 Type 2 diabetes mellitus with hyperglycemia: Secondary | ICD-10-CM

## 2024-06-04 MED ORDER — HYPROMELLOSE (GONIOSCOPIC) 2.5 % OP SOLN: 1.0000 [drp] | Freq: Three times a day (TID) | OPHTHALMIC | 12 refills | Status: AC | PRN

## 2024-06-04 MED ORDER — SURE-FINE PEN NEEDLES 31G X 8 MM MISC: 1.0000 | Freq: Every day | 11 refills | Status: AC

## 2024-06-04 MED ORDER — BASAGLAR KWIKPEN 100 UNIT/ML ~~LOC~~ SOPN: 16.0000 [IU] | PEN_INJECTOR | Freq: Every day | SUBCUTANEOUS | 3 refills | Status: AC

## 2024-06-04 MED ORDER — METFORMIN HCL 1000 MG PO TABS: 1000.0000 mg | ORAL_TABLET | Freq: Two times a day (BID) | ORAL | 2 refills | Status: AC

## 2024-06-04 NOTE — Patient Instructions (Signed)
 To keep you healthy, please keep in mind the following health maintenance items that you are due for:   Health Maintenance Due  Topic Date Due   Pneumococcal Vaccine: 50+ Years (1 of 2 - PCV) Never done   Zoster Vaccines- Shingrix (1 of 2) Never done   Influenza Vaccine  04/05/2024     Best Wishes,   Dr. Lang

## 2024-06-04 NOTE — Progress Notes (Signed)
 "     Established patient visit   Patient: Charles MACIVER [name]   DOB: 01/15/1966   58 y.o. Male  MRN: [id number] Visit Date: 06/04/2024  Today's healthcare provider: Rockie Agent, MD   Chief Complaint  Patient presents with   Back Pain    Midline right sided back pain onset x 2-3 weeks.  Patient believes he may have passed a kidney stone, did not notice anything in the toilet. No pain or burning with urination    Hematuria    Noticed blood on 9/11 and 9/13. Also noticing frequency with not being able to pass a lot of urine  Would like to do lab work to check prostate levels    Dry Eye    Patient states he gets dry eyes very easily, wanted to see if he could get some drops to help with his    Subjective     HPI     Back Pain    Additional comments: Midline right sided back pain onset x 2-3 weeks.  Patient believes he may have passed a kidney stone, did not notice anything in the toilet. No pain or burning with urination         Hematuria    Additional comments: Noticed blood on 9/11 and 9/13. Also noticing frequency with not being able to pass a lot of urine  Would like to do lab work to check prostate levels         Dry Eye    Additional comments: Patient states he gets dry eyes very easily, wanted to see if he could get some drops to help with his       Last edited by Cherry Chiquita HERO, CMA on 06/04/2024  2:44 PM.       Discussed the use of AI scribe software for clinical note transcription with the patient, who gave verbal consent to proceed.  History of Present Illness Charles [name]. is a 58 year old male who presents with hematuria and back pain.  He has been experiencing hematuria and back pain for the past two to three weeks. Hematuria was first noticed on September 11th and again on September 13th. He has urinary frequency and difficulty passing urine but no pain or burning with urination. He suspects he may have passed a kidney stone but  did not observe anything in the toilet.  He describes an episode of severe back pain that began on a Tuesday during a vacation in [place], which was intense enough to cause nausea. The pain was located in the lower back and was severe enough to limit his mobility, requiring assistance to move around. The pain subsided by the following Saturday. He has a history of lumbar back pain on the left side, but this recent episode was on the right side. He uses a nerve stimulator and takes muscle relaxers for relief.  He visited urgent care on September 14th, where he was treated for a possible urinary tract infection or kidney stone. He was given medication, which he did not specify, but reports no further issues since taking it.  He also reports experiencing dry eyes and is seeking lubricating drops. He describes a sensation of 'rocks' in his eyes, which is relieved temporarily by wiping his eyes.  He is currently taking metformin  1000 mg twice a day, insulin  (Basaglar ) 16 units daily, and Victoza  1.2 mg daily. He also uses meloxicam  for inflammation and tizanidine  as a muscle relaxer. No current stomach pain  and he is trying to maintain a good diet.     Past Medical History:  Diagnosis Date   Diabetes mellitus without complication (HCC)    Hypertension     Medications: Outpatient Medications Prior to Visit  Medication Sig   Accu-Chek Softclix Lancets lancets SMARTSIG:Topical   atorvastatin  (LIPITOR) 10 MG tablet Take 1 tablet (10 mg total) by mouth daily.   blood glucose meter kit and supplies Use glucose meter to test blood sugar once in the morning after fasting and 2 hours after largest meal of the day   fluticasone  (FLONASE ) 50 MCG/ACT nasal spray SPRAY 2 SPRAYS INTO EACH NOSTRIL EVERY DAY   glucose blood test strip Use as instructed to test blood sugar up to twice daily.   liraglutide  (VICTOZA ) 18 MG/3ML SOPN Inject 1.2 mg into the skin daily.   lisinopril -hydrochlorothiazide  (ZESTORETIC )  20-25 MG tablet Take 1 tablet by mouth daily.   tamsulosin  (FLOMAX ) 0.4 MG CAPS capsule Take 1 capsule (0.4 mg total) by mouth daily.   tiZANidine  (ZANAFLEX ) 4 MG capsule Take 1 capsule (4 mg total) by mouth 3 (three) times daily as needed for muscle spasms.   [DISCONTINUED] Insulin  Glargine (BASAGLAR  KWIKPEN) 100 UNIT/ML Inject 16 Units into the skin daily.   [DISCONTINUED] Insulin  Pen Needle (SURE-FINE PEN NEEDLES) 31G X 8 MM MISC 1 Needle by Does not apply route daily.   [DISCONTINUED] metFORMIN  (GLUCOPHAGE ) 1000 MG tablet TAKE 1 TABLET (1,000 MG TOTAL) BY MOUTH TWICE A DAY WITH FOOD   No facility-administered medications prior to visit.    Review of Systems  Last CBC Lab Results  Component Value Date   WBC 5.9 11/18/2021   HGB 15.1 11/18/2021   HCT 45.9 11/18/2021   MCV 81 11/18/2021   MCH 26.7 11/18/2021   RDW 13.6 11/18/2021   PLT 244 11/18/2021   Last metabolic panel Lab Results  Component Value Date   GLUCOSE 282 (H) 06/04/2024   NA 135 06/04/2024   K 4.0 06/04/2024   CL 98 06/04/2024   CO2 22 06/04/2024   BUN 10 06/04/2024   CREATININE 1.01 06/04/2024   EGFR 85 06/04/2024   CALCIUM  9.5 06/04/2024   PROT 7.0 11/07/2023   ALBUMIN 4.0 11/07/2023   LABGLOB 3.0 11/07/2023   AGRATIO 1.5 01/02/2023   BILITOT 0.3 11/07/2023   ALKPHOS 93 11/07/2023   AST 33 11/07/2023   ALT 37 11/07/2023   ANIONGAP 10 03/15/2020   Last lipids Lab Results  Component Value Date   CHOL 126 11/07/2023   HDL 37 (L) 11/07/2023   LDLCALC 69 11/07/2023   TRIG 106 11/07/2023   CHOLHDL 3.4 11/07/2023   Last hemoglobin A1c Lab Results  Component Value Date   HGBA1C 11.3 (A) 03/28/2024   Last thyroid functions Lab Results  Component Value Date   TSH 1.590 07/08/2021   Last vitamin D No results found for: 25OHVITD2, 25OHVITD3, VD25OH Last vitamin B12 and Folate No results found for: VITAMINB12, FOLATE      Objective    BP 128/85 (BP Location: Left Arm, Patient  Position: Sitting, Cuff Size: Normal)   Pulse 93   Temp 97.7 F (36.5 C) (Oral)   Ht 6' 4 (1.93 m)   Wt 256 lb 1.6 oz (116.2 kg)   SpO2 100%   BMI 31.17 kg/m   BP Readings from Last 3 Encounters:  06/04/24 128/85  03/28/24 131/86  11/07/23 (!) 140/84   Wt Readings from Last 3 Encounters:  06/04/24 256 lb 1.6 oz (  116.2 kg)  03/28/24 266 lb (120.7 kg)  05/01/23 273 lb (123.8 kg)        Physical Exam Eyes:     General: No visual field deficit.    Extraocular Movements: Extraocular movements intact.     Conjunctiva/sclera: Conjunctivae normal.     Right eye: Right conjunctiva is not injected. No exudate or hemorrhage.    Left eye: Left conjunctiva is not injected. No exudate or hemorrhage.    Physical Exam ABDOMEN: No costovertebral angle tenderness. MUSCULOSKELETAL: Muscle spasms present on right paraspinal area. Normal range of motion in spine.    Results for orders placed or performed in visit on 06/04/24  Urine Culture   UR  Result Value Ref Range   Urine Culture, Routine WILL FOLLOW   PSA Total (Reflex To Free)  Result Value Ref Range   Prostate Specific Ag, Serum 11.6 (H) 0.0 - 4.0 ng/mL   Reflex Criteria Comment   Urinalysis, Routine w reflex microscopic  Result Value Ref Range   Specific Gravity, UA 1.028 1.005 - 1.030   pH, UA 6.0 5.0 - 7.5   Color, UA Yellow Yellow   Appearance Ur Clear Clear   Leukocytes,UA Negative Negative   Protein,UA Negative Negative/Trace   Glucose, UA 3+ (A) Negative   Ketones, UA Negative Negative   RBC, UA Negative Negative   Bilirubin, UA Negative Negative   Urobilinogen, Ur 0.2 0.2 - 1.0 mg/dL   Nitrite, UA Negative Negative   Microscopic Examination Comment   BMP8+EGFR  Result Value Ref Range   Glucose 282 (H) 70 - 99 mg/dL   BUN 10 8 - 27 mg/dL   Creatinine, Ser 8.98 0.76 - 1.27 mg/dL   eGFR 85 >40 fO/fpw/8.26   BUN/Creatinine Ratio 10 10 - 24   Sodium 135 134 - 144 mmol/L   Potassium 4.0 3.5 - 5.2 mmol/L    Chloride 98 96 - 106 mmol/L   CO2 22 20 - 29 mmol/L   Calcium  9.5 8.6 - 10.2 mg/dL    Assessment & Plan     Problem List Items Addressed This Visit     Diabetes mellitus (HCC)   Relevant Medications   metFORMIN  (GLUCOPHAGE ) 1000 MG tablet   Insulin  Pen Needle (SURE-FINE PEN NEEDLES) 31G X 8 MM MISC   Insulin  Glargine (BASAGLAR  KWIKPEN) 100 UNIT/ML   Hyperlipidemia associated with type 2 diabetes mellitus (HCC)   Relevant Medications   metFORMIN  (GLUCOPHAGE ) 1000 MG tablet   Insulin  Glargine (BASAGLAR  KWIKPEN) 100 UNIT/ML   Other Visit Diagnoses       Gross hematuria    -  Primary   Relevant Orders   PSA Total (Reflex To Free) (Completed)   Urinalysis, Routine w reflex microscopic (Completed)   Urine Culture   BMP8+EGFR (Completed)     Bilateral dry eyes       Relevant Medications   hydroxypropyl methylcellulose / hypromellose (ISOPTO TEARS / GONIOVISC) 2.5 % ophthalmic solution     Urinary frequency       Relevant Orders   PSA Total (Reflex To Free) (Completed)   Urinalysis, Routine w reflex microscopic (Completed)   Urine Culture       Assessment and Plan Assessment & Plan Hematuria acute Intermittent hematuria on September 11th and 13th with urinary frequency and incomplete bladder emptying. Differential includes UTI and kidney stone passage. Previous urgent care visit suggested possible UTI or kidney stone. No dysuria or current urinary discomfort. - Order urinalysis and urine culture - Order urine  microscopy - Order basic metabolic panel - Order PSA test  Type 2 diabetes mellitus Chronic Type 2 diabetes mellitus managed with metformin , Basaglar  insulin , and Victoza . Recent weight loss of 10 pounds. Discussion about alternative treatments, including anecdotal reports of parasitic involvement, but no changes to current management. - Prescribe metformin  1000 mg twice daily - Prescribe insulin  needles - continuation of Basaglar  insulin  16 units daily -  continuation of Victoza  1.2 mg daily  Chronic right-sided low back pain Chronic right-sided low back pain managed with muscle relaxants and nerve stimulator. - Continue use of tizanidine  4mg  3-4 times daily as needed - Continue use of nerve stimulator for pain management - continue follow up with sports med for chronic back pain   Dry eye syndrome, bilateral Acute  Bilateral dry eye syndrome with sensation of foreign body in eyes. - Recommend over-the-counter lubricating eye drops     Return in about 2 months (around 08/04/2024) for DM, HTN.         Rockie Agent, MD  [facility] [phone] (phone) [phone] (fax)  [facility] "

## 2024-06-05 ENCOUNTER — Ambulatory Visit: Payer: Self-pay | Admitting: Family Medicine

## 2024-06-05 DIAGNOSIS — R310 Gross hematuria: Secondary | ICD-10-CM

## 2024-06-05 DIAGNOSIS — R972 Elevated prostate specific antigen [PSA]: Secondary | ICD-10-CM

## 2024-06-06 LAB — BMP8+EGFR
BUN/Creatinine Ratio: 10 (ref 10–24)
BUN: 10 mg/dL (ref 8–27)
CO2: 22 mmol/L (ref 20–29)
Calcium: 9.5 mg/dL (ref 8.6–10.2)
Chloride: 98 mmol/L (ref 96–106)
Creatinine, Ser: 1.01 mg/dL (ref 0.76–1.27)
Glucose: 282 mg/dL — ABNORMAL HIGH (ref 70–99)
Potassium: 4 mmol/L (ref 3.5–5.2)
Sodium: 135 mmol/L (ref 134–144)
eGFR: 85 mL/min/1.73 (ref >59)

## 2024-06-06 LAB — URINALYSIS, ROUTINE W REFLEX MICROSCOPIC
Bilirubin, UA: NEGATIVE
Ketones, UA: NEGATIVE
Leukocytes,UA: NEGATIVE
Nitrite, UA: NEGATIVE
Protein,UA: NEGATIVE
RBC, UA: NEGATIVE
Specific Gravity, UA: 1.028 (ref 1.005–1.030)
Urobilinogen, Ur: 0.2 mg/dL (ref 0.2–1.0)
pH, UA: 6 (ref 5.0–7.5)

## 2024-06-06 LAB — URINE CULTURE: Organism ID, Bacteria: NO GROWTH

## 2024-06-06 LAB — PSA TOTAL (REFLEX TO FREE): Prostate Specific Ag, Serum: 11.6 ng/mL — ABNORMAL HIGH (ref 0.0–4.0)

## 2024-06-27 ENCOUNTER — Ambulatory Visit: Admitting: Family Medicine

## 2024-07-16 DIAGNOSIS — N400 Enlarged prostate without lower urinary tract symptoms: Secondary | ICD-10-CM | POA: Insufficient documentation

## 2024-07-16 DIAGNOSIS — R310 Gross hematuria: Secondary | ICD-10-CM | POA: Insufficient documentation

## 2024-07-16 NOTE — Progress Notes (Unsigned)
 07/18/24 2:50 PM   Charles JONETTA Sayres [name] 1966/04/07 [id number]   HPI: 58 y.o. male here for initial evaluation of gross hematuria, leg pain Travel to [place] and mid-September  - Acute low back pain, more on right side, severe with gross hematuria  - Treated for presumptive UTI by the time he came back stateside, symptoms resolved  - PSA 11.6 (06/04/2024)-at the tail end of acute symptom flare  No personal history of nephrolithiasis No family history of prostate cancer No personal history of urologic surgeries Urinary habits mildly bothersome at baseline Non-smoker  History of gross hematuria s/p partial evaluation in 2023 w/ Dr. Sninsky  - CTU (Apr 2023) - enlarged prostate, no other GU abnormalities  - 130-140g prostate   - lost to follow up - did not complete office cystoscopy    PMH: Past Medical History:  Diagnosis Date   Diabetes mellitus without complication (HCC)    Hypertension     Surgical History: Past Surgical History:  Procedure Laterality Date   COLONOSCOPY WITH PROPOFOL  N/A 04/02/2021   Procedure: COLONOSCOPY WITH PROPOFOL ;  Surgeon: Janalyn Keene NOVAK, MD;  Location: [facility] ENDOSCOPY;  Service: Endoscopy;  Laterality: N/A;   COLONOSCOPY WITH PROPOFOL  N/A 10/07/2022   Procedure: COLONOSCOPY WITH PROPOFOL ;  Surgeon: Unk Corinn Skiff, MD;  Location: [facility] ENDOSCOPY;  Service: Gastroenterology;  Laterality: N/A;   DISTAL BICEPS TENDON REPAIR Right 03/31/2015   Procedure: DISTAL BICEPS TENDON REPAIR;  Surgeon: Norleen JINNY Maltos, MD;  Location: [facility] ORS;  Service: Orthopedics;  Laterality: Right;   ELBOW SURGERY Right 1975   LEG SURGERY Right 1971    Family History: Family History  Problem Relation Age of Onset   Breast cancer Mother    Cancer Father    Diabetes Father     Social History:  reports that he has never smoked. He has quit using smokeless tobacco.  His smokeless tobacco use included chew. He reports that he does not drink alcohol and does not use  drugs.      Physical Exam: BP (!) 150/89   Pulse 99   Ht 6' 4 (1.93 m)   Wt 256 lb (116.1 kg)   BMI 31.16 kg/m    Constitutional:  Alert and oriented, No acute distress. Cardiovascular: No clubbing, cyanosis, or edema. Respiratory: Normal respiratory effort, no increased work of breathing. GI: Nondistended Skin: No rashes, bruises or suspicious lesions. Neurologic: Grossly intact, no focal deficits, moving all 4 extremities. Psychiatric: Normal mood and affect.  Laboratory Data: Component Ref Range & Units (hover) 1 mo ago  Prostate Specific Ag, Serum 11.6 High      Pertinent Imaging: N/A    Assessment & Plan:    Benign prostatic hyperplasia with urinary frequency -     Urinalysis, Complete -     Bladder Scan (Post Void Residual) in office -     PSA; Future -     CT HEMATURIA WORKUP; Future  Gross hematuria Assessment & Plan: Hx of gross hematuria (2023 and Sept 2025)   - Most recent acute episode with right flank pain and GH-unclear etiology, possible UTI vs prostatitis vs stone event  - CTU (2023)- 130-140g prostate  - has not had cystoscopy   -Proceed with CT urogram and office cystoscopy - Follow-up urinalysis today, reflex culture if indicated    Orders: -     CT HEMATURIA WORKUP; Future  Benign prostatic hyperplasia, unspecified whether lower urinary tract symptoms present Assessment & Plan: 130-140g prostate (via CT estimation in  2023) On Flomax  PVR 3cc today  Seems to have mild urinary symptoms at baseline, not overly bothered.  Emptying his bladder well today.  We will continue anatomic workup with CTU and cystoscopy (primarily for unexplained gross hematuria, exclude nephrolithiasis) -but will also help recharacterize his prostate bladder anatomy.  We may need to strongly consider finasteride versus surgical outlet procedure if symptoms progress.   Orders: -     PSA; Future      Charles Skye, MD 07/18/2024  [facility] [address] [place], [place] [address] [phone]

## 2024-07-16 NOTE — Assessment & Plan Note (Signed)
 130-140g prostate (via CT estimation in 2023) On Flomax  PVR 3cc today  Seems to have mild urinary symptoms at baseline, not overly bothered.  Emptying his bladder well today.  We will continue anatomic workup with CTU and cystoscopy (primarily for unexplained gross hematuria, exclude nephrolithiasis) -but will also help recharacterize his prostate bladder anatomy.  We may need to strongly consider finasteride versus surgical outlet procedure if symptoms progress.

## 2024-07-16 NOTE — Assessment & Plan Note (Signed)
 Hx of gross hematuria (2023 and Sept 2025)   - Most recent acute episode with right flank pain and GH-unclear etiology, possible UTI vs prostatitis vs stone event  - CTU (2023)- 130-140g prostate  - has not had cystoscopy   -Proceed with CT urogram and office cystoscopy - Follow-up urinalysis today, reflex culture if indicated

## 2024-07-18 ENCOUNTER — Ambulatory Visit: Admitting: Urology

## 2024-07-18 ENCOUNTER — Other Ambulatory Visit: Payer: Self-pay

## 2024-07-18 VITALS — BP 150/89 | HR 99 | Ht 76.0 in | Wt 256.0 lb

## 2024-07-18 DIAGNOSIS — R350 Frequency of micturition: Secondary | ICD-10-CM | POA: Diagnosis not present

## 2024-07-18 DIAGNOSIS — R310 Gross hematuria: Secondary | ICD-10-CM

## 2024-07-18 DIAGNOSIS — N400 Enlarged prostate without lower urinary tract symptoms: Secondary | ICD-10-CM

## 2024-07-18 DIAGNOSIS — N401 Enlarged prostate with lower urinary tract symptoms: Secondary | ICD-10-CM | POA: Diagnosis not present

## 2024-07-18 LAB — URINALYSIS, COMPLETE
Bilirubin, UA: NEGATIVE
Ketones, UA: NEGATIVE
Leukocytes,UA: NEGATIVE
Nitrite, UA: NEGATIVE
Protein,UA: NEGATIVE
Specific Gravity, UA: 1.015 (ref 1.005–1.030)
Urobilinogen, Ur: 0.2 mg/dL (ref 0.2–1.0)
pH, UA: 6 (ref 5.0–7.5)

## 2024-07-18 LAB — BLADDER SCAN AMB NON-IMAGING: Scan Result: 3

## 2024-07-18 LAB — MICROSCOPIC EXAMINATION

## 2024-07-18 NOTE — Patient Instructions (Addendum)
 Please contact Central Scheduling to set up your CT at [phone].   Cystoscopy Cystoscopy is a procedure that is used to help diagnose and sometimes treat conditions that affect the lower urinary tract. The lower urinary tract includes the bladder and the urethra. The urethra is the tube that drains urine from the bladder. Cystoscopy is done using a thin, tube-shaped instrument with a light and camera at the end (cystoscope). The cystoscope may be hard or flexible, depending on the goal of the procedure. The cystoscope is inserted through the urethra, into the bladder. Cystoscopy may be recommended if you have: Urinary tract infections that keep coming back. Blood in the urine (hematuria). An inability to control when you urinate (urinary incontinence) or an overactive bladder. Unusual cells found in a urine sample. A blockage in the urethra, such as a urinary stone. Painful urination. An abnormality in the bladder found during an intravenous pyelogram (IVP) or CT scan. What are the risks? Generally, this is a safe procedure. However, problems may occur, including: Infection. Bleeding.  What happens during the procedure?  You will be given one or more of the following: A medicine to numb the area (local anesthetic). The area around the opening of your urethra will be cleaned. The cystoscope will be passed through your urethra into your bladder. Germ-free (sterile) fluid will flow through the cystoscope to fill your bladder. The fluid will stretch your bladder so that your health care provider can clearly examine your bladder walls. Your doctor will look at the urethra and bladder. The cystoscope will be removed The procedure may vary among health care providers  What can I expect after the procedure? After the procedure, it is common to have: Some soreness or pain in your urethra. Urinary symptoms. These include: Mild pain or burning when you urinate. Pain should stop within a few  minutes after you urinate. This may last for up to a few days after the procedure. A small amount of blood in your urine for several days. Feeling like you need to urinate but producing only a small amount of urine. Follow these instructions at home: General instructions Return to your normal activities as told by your health care provider.  Drink plenty of fluids after the procedure. Keep all follow-up visits as told by your health care provider. This is important. Contact a health care provider if you: Have pain that gets worse or does not get better with medicine, especially pain when you urinate lasting longer than 72 hours after the procedure. Have trouble urinating. Get help right away if you: Have blood clots in your urine. Have a fever or chills. Are unable to urinate. Summary Cystoscopy is a procedure that is used to help diagnose and sometimes treat conditions that affect the lower urinary tract. Cystoscopy is done using a thin, tube-shaped instrument with a light and camera at the end. After the procedure, it is common to have some soreness or pain in your urethra. It is normal to have blood in your urine after the procedure.  If you were prescribed an antibiotic medicine, take it as told by your health care provider.  This information is not intended to replace advice given to you by your health care provider. Make sure you discuss any questions you have with your health care provider. Document Revised: 08/14/2018 Document Reviewed: 08/14/2018 Elsevier Patient Education  2020 [employer].

## 2024-07-19 LAB — PSA: Prostate Specific Ag, Serum: 5.5 ng/mL — ABNORMAL HIGH (ref 0.0–4.0)

## 2024-07-22 ENCOUNTER — Telehealth: Payer: Self-pay

## 2024-07-22 NOTE — Telephone Encounter (Signed)
 I changed imaging center from [facility] to [facility] per [facility].

## 2024-07-22 NOTE — Addendum Note (Signed)
 Addended by: Stephonie Wilcoxen E on: 07/22/2024 10:30 AM   Modules accepted: Orders

## 2024-07-24 ENCOUNTER — Encounter: Payer: Self-pay | Admitting: Urology

## 2024-07-25 ENCOUNTER — Ambulatory Visit

## 2024-07-26 ENCOUNTER — Ambulatory Visit
Admission: RE | Admit: 2024-07-26 | Discharge: 2024-07-26 | Disposition: A | Discharge status: Home / Self Care | Source: Ambulatory Visit | Attending: Urology | Admitting: Urology

## 2024-07-26 DIAGNOSIS — R310 Gross hematuria: Secondary | ICD-10-CM

## 2024-07-26 MED ORDER — IOPAMIDOL (ISOVUE-300) INJECTION 61%
100.0000 mL | Freq: Once | INTRAVENOUS | Status: AC | PRN
Start: 2024-07-26 — End: 2024-07-26
  Administered 2024-07-26: 100 mL via INTRAVENOUS

## 2024-08-14 ENCOUNTER — Ambulatory Visit: Admitting: Family Medicine

## 2024-08-14 ENCOUNTER — Encounter: Payer: Self-pay | Admitting: Family Medicine

## 2024-08-14 VITALS — BP 133/82 | HR 94 | Ht 75.0 in | Wt 263.1 lb

## 2024-08-14 DIAGNOSIS — E785 Hyperlipidemia, unspecified: Secondary | ICD-10-CM

## 2024-08-14 DIAGNOSIS — E1159 Type 2 diabetes mellitus with other circulatory complications: Secondary | ICD-10-CM

## 2024-08-14 DIAGNOSIS — Z794 Long term (current) use of insulin: Secondary | ICD-10-CM

## 2024-08-14 DIAGNOSIS — E1169 Type 2 diabetes mellitus with other specified complication: Secondary | ICD-10-CM

## 2024-08-14 DIAGNOSIS — I152 Hypertension secondary to endocrine disorders: Secondary | ICD-10-CM

## 2024-08-14 DIAGNOSIS — N400 Enlarged prostate without lower urinary tract symptoms: Secondary | ICD-10-CM | POA: Diagnosis not present

## 2024-08-14 NOTE — Patient Instructions (Signed)
 To keep you healthy, please keep in mind the following health maintenance items that you are due for:   Health Maintenance Due  Topic Date Due   Pneumococcal Vaccine: 50+ Years (1 of 2 - PCV) Never done   Zoster Vaccines- Shingrix (1 of 2) Never done   COVID-19 Vaccine (1 - 2025-26 season) Never done     Best Wishes,   Dr. Lang

## 2024-08-14 NOTE — Progress Notes (Signed)
 Established patient visit   Patient: Charles POSTEN [name]   DOB: 1966-08-19   58 y.o. Male  MRN: [id number] Visit Date: 08/14/2024  Today's healthcare provider: Rockie Agent, MD   Chief Complaint  Patient presents with   Medical Management of Chronic Issues    2 month f/u for DM, HTN.   Diabetes    Avg 98-100 mg/dL fasting and then throughout the day 125-130 mg/dL    Hypertension    Patient reports no symptoms. Monitoring at home with average readings of 130s/70-80   Care Management    Declined pneumococcal and zoster vaccine   Subjective     HPI     Medical Management of Chronic Issues    Additional comments: 2 month f/u for DM, HTN.        Diabetes    Additional comments: Avg 98-100 mg/dL fasting and then throughout the day 125-130 mg/dL         Hypertension    Additional comments: Patient reports no symptoms. Monitoring at home with average readings of 130s/70-80        Care Management    Additional comments: Declined pneumococcal and zoster vaccine      Last edited by Lilian Fitzpatrick, CMA on 08/14/2024  3:36 PM.       Discussed the use of AI scribe software for clinical note transcription with the patient, who gave verbal consent to proceed.  History of Present Illness Charles [name]. is a 58 year old male with chronic hyperlipidemia and type 2 diabetes who presents for follow-up.  He is currently on Victoza  1.8 mg daily, atorvastatin  70 mg daily, basal insulin  16 units daily, lisinopril  20 mg, hydrochlorothiazide  25 mg, metformin  1000 mg twice daily, and Flomax  0.4 mg daily. His blood pressure is borderline controlled at 133/82. His last A1c was 11.3. His average fasting blood sugar ranges from 98 to 100, and throughout the day it ranges from 125 to 130. No changes in his medications since the last visit and no side effects such as sickness or pain. Taking metformin  before breakfast previously caused diarrhea, but he has  adjusted his routine to take it after lunch, which has alleviated the issue.  He is scheduled for a cystoscopy tomorrow and feels apprehensive about the procedure. A previous prostate check three years ago showed no change in size, and he was informed that he might have been born with an enlarged prostate. He reports good urinary flow and takes Flomax  as needed.  He declines recommendations for pneumococcal, Shingrix, and COVID vaccines. He discusses his dietary habits, including eating sausage dip and baked chicken with collard greens, which he believes may have affected his blood pressure readings.     Past Medical History:  Diagnosis Date   Diabetes mellitus without complication (HCC)    Hypertension     Medications: Outpatient Medications Prior to Visit  Medication Sig   Accu-Chek Softclix Lancets lancets SMARTSIG:Topical   atorvastatin  (LIPITOR) 10 MG tablet Take 1 tablet (10 mg total) by mouth daily.   blood glucose meter kit and supplies Use glucose meter to test blood sugar once in the morning after fasting and 2 hours after largest meal of the day   fluticasone  (FLONASE ) 50 MCG/ACT nasal spray SPRAY 2 SPRAYS INTO EACH NOSTRIL EVERY DAY   glucose blood test strip Use as instructed to test blood sugar up to twice daily.   hydroxypropyl methylcellulose / hypromellose (ISOPTO TEARS / GONIOVISC) 2.5 %  ophthalmic solution Place 1 drop into both eyes 3 (three) times daily as needed for dry eyes.   Insulin  Glargine (BASAGLAR  KWIKPEN) 100 UNIT/ML Inject 16 Units into the skin daily.   Insulin  Pen Needle (SURE-FINE PEN NEEDLES) 31G X 8 MM MISC 1 Needle by Does not apply route daily.   liraglutide  (VICTOZA ) 18 MG/3ML SOPN Inject 1.2 mg into the skin daily.   lisinopril -hydrochlorothiazide  (ZESTORETIC ) 20-25 MG tablet Take 1 tablet by mouth daily.   metFORMIN  (GLUCOPHAGE ) 1000 MG tablet Take 1 tablet (1,000 mg total) by mouth 2 (two) times daily with a meal.   tamsulosin  (FLOMAX ) 0.4 MG  CAPS capsule Take 1 capsule (0.4 mg total) by mouth daily.   tiZANidine  (ZANAFLEX ) 4 MG capsule Take 1 capsule (4 mg total) by mouth 3 (three) times daily as needed for muscle spasms.   No facility-administered medications prior to visit.    Review of Systems  Last metabolic panel Lab Results  Component Value Date   GLUCOSE 282 (H) 06/04/2024   NA 135 06/04/2024   K 4.0 06/04/2024   CL 98 06/04/2024   CO2 22 06/04/2024   BUN 10 06/04/2024   CREATININE 1.01 06/04/2024   EGFR 85 06/04/2024   CALCIUM  9.5 06/04/2024   PROT 7.0 11/07/2023   ALBUMIN 4.0 11/07/2023   LABGLOB 3.0 11/07/2023   AGRATIO 1.5 01/02/2023   BILITOT 0.3 11/07/2023   ALKPHOS 93 11/07/2023   AST 33 11/07/2023   ALT 37 11/07/2023   ANIONGAP 10 03/15/2020   Last lipids Lab Results  Component Value Date   CHOL 136 08/14/2024   HDL 34 (L) 08/14/2024   LDLCALC 86 08/14/2024   TRIG 82 08/14/2024   CHOLHDL 3.4 11/07/2023   Last hemoglobin A1c Lab Results  Component Value Date   HGBA1C 13.1 (H) 08/14/2024        Objective    BP 133/82 (BP Location: Left Arm, Patient Position: Sitting, Cuff Size: Large)   Pulse 94   Ht 6' 3 (1.905 m)   Wt 263 lb 1.6 oz (119.3 kg)   SpO2 98%   BMI 32.89 kg/m  BP Readings from Last 3 Encounters:  08/15/24 (!) 156/106  08/14/24 133/82  07/18/24 (!) 150/89   Wt Readings from Last 3 Encounters:  08/15/24 263 lb (119.3 kg)  08/14/24 263 lb 1.6 oz (119.3 kg)  07/18/24 256 lb (116.1 kg)        Physical Exam Vitals reviewed.  Constitutional:      General: He is not in acute distress.    Appearance: Normal appearance. He is not ill-appearing.  Cardiovascular:     Rate and Rhythm: Normal rate and regular rhythm.  Pulmonary:     Effort: Pulmonary effort is normal. No respiratory distress.     Breath sounds: No wheezing, rhonchi or rales.  Neurological:     Mental Status: He is alert and oriented to person, place, and time.  Psychiatric:        Mood and  Affect: Mood normal.        Behavior: Behavior normal.       Results for orders placed or performed in visit on 08/14/24  Hemoglobin A1c  Result Value Ref Range   Hgb A1c MFr Bld 13.1 (H) 4.8 - 5.6 %   Est. average glucose Bld gHb Est-mCnc 329 mg/dL  Lipid Panel With LDL/HDL Ratio  Result Value Ref Range   Cholesterol, Total 136 100 - 199 mg/dL   Triglycerides 82 0 - 149  mg/dL   HDL 34 (L) >60 mg/dL   VLDL Cholesterol Cal 16 5 - 40 mg/dL   LDL Chol Calc (NIH) 86 0 - 99 mg/dL   LDL/HDL Ratio 2.5 0.0 - 3.6 ratio    Assessment & Plan     Problem List Items Addressed This Visit     BPH (benign prostatic hyperplasia)   Benign prostatic hyperplasia Chronic benign prostatic hyperplasia with no significant change in prostate size over the past three years. Reports strong urinary flow and no significant symptoms. Previous evaluation suggested possible congenital enlarged prostate. - Continue Flomax  0.4 mg daily - Proceed with scheduled cystoscopy with urology       Diabetes mellitus (HCC) - Primary   Type 2 diabetes mellitus complicated by hyperlipidemia and hypertension Chronic type 2 diabetes mellitus with previous A1c of 11.3, indicating poor control. Current blood sugar levels are improving with fasting range of 98-100 mg/dL and daytime range of 874-869 mg/dL. Blood pressure is borderline controlled at 133/82 mmHg. No adverse effects from current medications reported. - Repeated A1c test today - Repeated lipid panel and normal metabolic panel - Continue Victoza  18 mg daily - Continue metformin  1000 mg twice daily - Continue lisinopril  20 mg and hydrochlorothiazide  25 mg daily      Relevant Orders   Hemoglobin A1c (Completed)   Lipid Panel With LDL/HDL Ratio (Completed)   Hyperlipidemia associated with type 2 diabetes mellitus (HCC)   Hyperlipidemia, Chronic  On atorvastatin  10 mg for cholesterol management. - Continue atorvastatin  10 mg daily.      Relevant Orders    Hemoglobin A1c (Completed)   Lipid Panel With LDL/HDL Ratio (Completed)   Hypertension associated with diabetes (HCC)   Chronic  Well-controlled hypertension  - Continue lisinopril  20 mg and hydrochlorothiazide  25 mg daily.       Assessment and Plan Assessment & Plan        Return in about 14 weeks (around 11/20/2024) for Chronic F/U, Cholesterol, DM, HTN.         Rockie Agent, MD  [facility] [phone] (phone) [phone] (fax)  [facility]

## 2024-08-14 NOTE — Progress Notes (Signed)
° °  08/15/2024 8:23 AM   Charles JONETTA Sayres [name] Feb 03, 1966 [id number]  Cystoscopy Procedure Note:  Indication:  Hx of gross hematuria (2023 and Sept 2025)   - Most recent acute episode with right flank pain and GH-unclear etiology, possible UTI vs prostatitis vs stone event  - CTU (2023)- 130-140g prostate  - has not had cystoscopy   - PSA down to 5.5 (from 11.6) - PSAD low, likely benign   After informed consent and discussion of the procedure and its risks, Charles FLIPPEN [name] was positioned and prepped in the standard fashion. Cystoscopy was performed with a flexible cystoscope. The urethra, bladder neck and bladder mucosa were visualized in a systematic fashion. The ureteral orifices were noted in orthotopic location and orientation. There were no bladder mucosal lesions, stones, debris or anatomic variants noted. The prostate gland was bilobar with severe occlusive hypertrophy, very large prostate gland w/ friable tissue.  Remainder of the bladder survey was limited by immediate oozing from prostate friability.  He did not appear to have any bladder lesions.  He did appear to have a capacious posterior bladder with possible trabeculations.   Imaging: Recent CTU  (08/01/24) reviewed - 200-230g prostate gland  IMPRESSION: 1. 1 to 2 mm nonobstructing stone in the upper pole of the right kidney. 2. No ureteral stones, obstructive uropathy, or urothelial mass. 3. Prostate enlargement.  Findings: Significant bilobar prostatic obstruction with friable prostatic tissue Limited bladder evaluation, did not appear to have lesions, possible trabeculations and early decompensation  Assessment and Plan: - Will start finasteride now - Refer to Dr. Francisca for discussion of HoLEP   [name], MD 08/14/2024

## 2024-08-15 ENCOUNTER — Encounter: Payer: Self-pay | Admitting: Urology

## 2024-08-15 ENCOUNTER — Ambulatory Visit: Admitting: Urology

## 2024-08-15 DIAGNOSIS — R350 Frequency of micturition: Secondary | ICD-10-CM | POA: Diagnosis not present

## 2024-08-15 DIAGNOSIS — N401 Enlarged prostate with lower urinary tract symptoms: Secondary | ICD-10-CM

## 2024-08-15 LAB — LIPID PANEL WITH LDL/HDL RATIO
Cholesterol, Total: 136 mg/dL (ref 100–199)
HDL: 34 mg/dL — ABNORMAL LOW (ref >39)
LDL Chol Calc (NIH): 86 mg/dL (ref 0–99)
LDL/HDL Ratio: 2.5 ratio (ref 0.0–3.6)
Triglycerides: 82 mg/dL (ref 0–149)
VLDL Cholesterol Cal: 16 mg/dL (ref 5–40)

## 2024-08-15 LAB — HEMOGLOBIN A1C
Est. average glucose Bld gHb Est-mCnc: 329 mg/dL
Hgb A1c MFr Bld: 13.1 % — ABNORMAL HIGH (ref 4.8–5.6)

## 2024-08-15 MED ORDER — FINASTERIDE 5 MG PO TABS: 5.0000 mg | ORAL_TABLET | Freq: Every day | ORAL | 3 refills | Status: AC

## 2024-08-16 ENCOUNTER — Telehealth: Payer: Self-pay

## 2024-08-16 ENCOUNTER — Telehealth: Payer: Self-pay | Admitting: Urology

## 2024-08-16 LAB — URINALYSIS, COMPLETE
Bilirubin, UA: NEGATIVE
Ketones, UA: NEGATIVE
Leukocytes,UA: NEGATIVE
Nitrite, UA: NEGATIVE
Protein,UA: NEGATIVE
Specific Gravity, UA: 1.025 (ref 1.005–1.030)
Urobilinogen, Ur: 0.2 mg/dL (ref 0.2–1.0)
pH, UA: 6 (ref 5.0–7.5)

## 2024-08-16 LAB — MICROSCOPIC EXAMINATION

## 2024-08-16 NOTE — Telephone Encounter (Signed)
 Patient is ok to take both medications flomax  and finasteride

## 2024-08-16 NOTE — Telephone Encounter (Signed)
 Pt's wife called and wanted to know if pt should continue Tamsulosin  prescribed by his pcp along with the Finasteride that Dr. Georganne prescribed yesterday.

## 2024-08-16 NOTE — Telephone Encounter (Signed)
 Copied from CRM #[id number]. Topic: Clinical - Medication Question >> Aug 16, 2024  8:08 AM Franky GRADE wrote: Reason for CRM: Patient was seen by the Urologist yesterday and prescribed finasteride (PROSCAR) 5 MG tablet [[id number]]; however, they would like to know if they should still be taking tamsulosin  (FLOMAX ) 0.4 MG CAPS capsule [[id number]] as both medications are for the prostate.

## 2024-08-17 ENCOUNTER — Ambulatory Visit: Payer: Self-pay | Admitting: Family Medicine

## 2024-08-17 DIAGNOSIS — E1165 Type 2 diabetes mellitus with hyperglycemia: Secondary | ICD-10-CM

## 2024-08-17 MED ORDER — LIRAGLUTIDE 18 MG/3ML ~~LOC~~ SOPN: 1.8000 mg | PEN_INJECTOR | Freq: Every day | SUBCUTANEOUS | 3 refills | Status: AC

## 2024-08-17 NOTE — Assessment & Plan Note (Signed)
 Benign prostatic hyperplasia Chronic benign prostatic hyperplasia with no significant change in prostate size over the past three years. Reports strong urinary flow and no significant symptoms. Previous evaluation suggested possible congenital enlarged prostate. - Continue Flomax  0.4 mg daily - Proceed with scheduled cystoscopy with urology

## 2024-08-17 NOTE — Assessment & Plan Note (Signed)
 Chronic  Well-controlled hypertension  - Continue lisinopril  20 mg and hydrochlorothiazide  25 mg daily.

## 2024-08-17 NOTE — Assessment & Plan Note (Signed)
 Hyperlipidemia, Chronic  On atorvastatin  10 mg for cholesterol management. - Continue atorvastatin  10 mg daily.

## 2024-08-17 NOTE — Assessment & Plan Note (Signed)
 Type 2 diabetes mellitus complicated by hyperlipidemia and hypertension Chronic type 2 diabetes mellitus with previous A1c of 11.3, indicating poor control. Current blood sugar levels are improving with fasting range of 98-100 mg/dL and daytime range of 874-869 mg/dL. Blood pressure is borderline controlled at 133/82 mmHg. No adverse effects from current medications reported. - Repeated A1c test today - Repeated lipid panel and normal metabolic panel - Continue Victoza  18 mg daily - Continue metformin  1000 mg twice daily - Continue lisinopril  20 mg and hydrochlorothiazide  25 mg daily

## 2024-08-19 ENCOUNTER — Other Ambulatory Visit: Payer: Self-pay

## 2024-08-19 NOTE — Telephone Encounter (Signed)
-----   Message from Rockie Agent, MD sent at 08/17/2024 11:12 AM EST ----- A1c has increased from 11 to 13.1 - increased dose of victoza  1.8mg  daily has been sent to your pharmacy. I would also like you to increase your glargine dose from 16 units to 20 units daily.   Cholesterol elements have increased and LDL is above goal range of less than 55

## 2024-09-05 ENCOUNTER — Other Ambulatory Visit: Payer: Self-pay | Admitting: Family Medicine

## 2024-09-05 DIAGNOSIS — E1169 Type 2 diabetes mellitus with other specified complication: Secondary | ICD-10-CM

## 2024-09-05 DIAGNOSIS — N401 Enlarged prostate with lower urinary tract symptoms: Secondary | ICD-10-CM

## 2024-10-04 ENCOUNTER — Ambulatory Visit: Admitting: Urology

## 2024-10-04 VITALS — BP 148/100 | HR 87

## 2024-10-04 DIAGNOSIS — N401 Enlarged prostate with lower urinary tract symptoms: Secondary | ICD-10-CM | POA: Diagnosis not present

## 2024-10-04 DIAGNOSIS — R350 Frequency of micturition: Secondary | ICD-10-CM | POA: Diagnosis not present

## 2024-10-04 DIAGNOSIS — R972 Elevated prostate specific antigen [PSA]: Secondary | ICD-10-CM

## 2024-10-04 MED ORDER — TAMSULOSIN HCL 0.4 MG PO CAPS: 0.4000 mg | ORAL_CAPSULE | Freq: Every day | ORAL | 3 refills | Status: AC

## 2024-10-04 NOTE — Progress Notes (Signed)
 Patient presents for an office visit. BP today is High. Greater than 140/90. Provider  notified and recheck Blood Pressure 148/100.  Pt advised to talk with PCP.  Pt voiced understanding.

## 2024-10-04 NOTE — Progress Notes (Signed)
" ° °  10/04/2024 9:53 AM   Charles JONETTA Sayres [name] April 28, 1966 [id number]  Reason for visit: BPH, gross hematuria, elevated PSA  History: Initially seen by me March 2023 for painless gross hematuria, CT at that time benign but he did not follow-up for cystoscopy Possible UTI September 2025 with PSA elevation of 11.6, decreased to 5.5 on follow-up with low PSA density of 0.04 Cystoscopy with Dr. Georganne December 2025 with obstructive appearing prostate and friable prostate tissue but no other abnormalities On Flomax  and finasteride (finasteride  started December 2025) Medical history notable for uncontrolled diabetes with hemoglobin A1c of 13   Imaging/labs: I personally viewed and interpreted the CT urogram dated 07/26/2024, prostate measures 150g, no hydronephrosis or other significant urologic abnormalities PSA November 2025 5.5 Hemoglobin A1c 13 Urinalysis benign aside from glucosuria  Today: Overall doing well, minimal urinary complaints on the Flomax  and finasteride , IPSS score today 5 He is planning on pursuing some alternative treatments for his diabetes in [place] Denies any recurrent gross hematuria or urinary symptoms today  Plan:   BPH/LUTS: Well-controlled on Flomax  and finasteride , discussed alternatives like HOLEP but he would like to continue with medications alone.  We also discussed impact of significant glucosuria from poorly controlled diabetes on his urinary symptoms Elevated PSA: Most recent PSA 5.5 with a very low and reassuring PSA density of 0.04.  I think very reasonable to defer biopsy but would recommend recheck in 6 months after finasteride  to confirm decrease RTC 6 months repeat PSA, PVR.  Continue finasteride  and Flomax , Flomax  was refilled   Charles JAYSON Burnet, MD  [facility] [address], [address] [place], [place] [address] [phone]  "

## 2024-10-04 NOTE — Patient Instructions (Signed)
 Understanding BPH (Benign Prostatic Hyperplasia)  What is BPH? BPH stands for Benign Prostatic Hyperplasia. It is a common condition that affects many men as they get older. It happens when the prostate gland, which is part of the male reproductive system, gets bigger. This can cause problems when urinating.  What Are the Symptoms?    Feeling like you need to urinate often, especially at night Trouble starting urinating or weak stream Sudden urge to urinate Feeling that your bladder has not emptied completely  Why Does It Happen? As men age, the prostate naturally gets bigger. This can press against the tube that carries urine out of the bladder, making it hard to urinate.  How Is BPH Treated? There are different ways to treat BPH, depending on how severe the symptoms are:  Lifestyle Changes    Reduce drinks before bedtime Avoid diet/flavored drinks, caffeine, and alcohol Practice double voiding (urinating twice during a trip to the bathroom)  Medications    Alpha-blockers (like tamsulosin /flomax ) to relax the muscles in the prostate and bladder neck 5-alpha reductase inhibitors (like finasteride) to shrink the prostate  If your symptoms do not respond to medications, you have side effect from medications, or would just like to get off of medications, there are multiple surgical options to improve urination.  Procedures and Surgery(Urolift and HoLEP)    UroLift: ~15-minute outpatient procedure performed in the operating room where the prostate is pinned open with small clips.  Patient's discharge the same day, rarely needed catheter, no risk of urinary leakage or erection problems.  Very low risk of bleeding or infection.  It is common to have some temporary irritative symptoms for 1 to 2 weeks after surgery including burning with urination, urgency, frequency.  This is a good option for small prostate or patients with mild to moderate symptoms, but may not be the best long-term  treatment if you have a very large prostate, have urinary retention requiring a catheter, or have severe symptoms  HoLEP(holmium laser enucleation of the prostate): ~1 hour outpatient procedure performed in the operating room where a laser is used to hollow out a large channel through the prostate.  The procedure was performed through the urethra, so there are no cuts or incisions.  You will need a catheter for 2 days afterwards.  Low risk of bleeding, infection, or damage to the bladder or ureters.  This is the gold standard BPH procedure.  Common to have urinary urgency, frequency, and leakage temporarily afterwards, but <2% risk of long-term incontinence.  This is the best treatment if you have a very large prostate, retention requiring a Foley catheter, or have severe symptoms.

## 2024-11-21 ENCOUNTER — Ambulatory Visit: Admitting: Family Medicine

## 2024-12-04 ENCOUNTER — Ambulatory Visit: Admitting: Family Medicine

## 2025-04-01 ENCOUNTER — Ambulatory Visit: Admitting: Urology
